# Patient Record
Sex: Female | Born: 1985 | Race: White | Hispanic: No | Marital: Married | Smoking: Never smoker
Health system: Southern US, Community
[De-identification: ages and names within clinical notes are randomized; demographics above are authoritative.]

## PROBLEM LIST (undated history)

## (undated) DIAGNOSIS — R519 Headache, unspecified: Secondary | ICD-10-CM

## (undated) DIAGNOSIS — F32A Depression, unspecified: Secondary | ICD-10-CM

## (undated) DIAGNOSIS — D649 Anemia, unspecified: Secondary | ICD-10-CM

## (undated) DIAGNOSIS — R569 Unspecified convulsions: Secondary | ICD-10-CM

## (undated) DIAGNOSIS — M1990 Unspecified osteoarthritis, unspecified site: Secondary | ICD-10-CM

## (undated) DIAGNOSIS — E7800 Pure hypercholesterolemia, unspecified: Secondary | ICD-10-CM

## (undated) DIAGNOSIS — Z9114 Patient's other noncompliance with medication regimen: Secondary | ICD-10-CM

## (undated) DIAGNOSIS — F419 Anxiety disorder, unspecified: Secondary | ICD-10-CM

## (undated) DIAGNOSIS — Z91148 Patient's other noncompliance with medication regimen for other reason: Secondary | ICD-10-CM

## (undated) HISTORY — PX: NO PAST SURGERIES: SHX2092

## (undated) HISTORY — DX: Headache, unspecified: R51.9

## (undated) HISTORY — DX: Unspecified osteoarthritis, unspecified site: M19.90

## (undated) HISTORY — DX: Depression, unspecified: F32.A

## (undated) HISTORY — DX: Anxiety disorder, unspecified: F41.9

## (undated) HISTORY — DX: Pure hypercholesterolemia, unspecified: E78.00

---
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
## 2004-03-06 ENCOUNTER — Inpatient Hospital Stay (HOSPITAL_COMMUNITY): Admission: AD | Admit: 2004-03-06 | Discharge: 2004-03-06 | Payer: Self-pay | Admitting: Obstetrics

## 2004-06-28 ENCOUNTER — Inpatient Hospital Stay (HOSPITAL_COMMUNITY): Admission: AD | Admit: 2004-06-28 | Discharge: 2004-07-01 | Payer: Self-pay | Admitting: Obstetrics

## 2004-11-10 ENCOUNTER — Emergency Department (HOSPITAL_COMMUNITY): Admission: EM | Admit: 2004-11-10 | Discharge: 2004-11-10 | Payer: Self-pay | Admitting: Emergency Medicine

## 2004-11-18 ENCOUNTER — Emergency Department (HOSPITAL_COMMUNITY): Admission: EM | Admit: 2004-11-18 | Discharge: 2004-11-18 | Payer: Self-pay | Admitting: Emergency Medicine

## 2015-06-13 ENCOUNTER — Other Ambulatory Visit: Payer: Self-pay | Admitting: Adult Health

## 2015-06-13 ENCOUNTER — Encounter: Payer: Self-pay | Admitting: Adult Health

## 2015-06-26 ENCOUNTER — Other Ambulatory Visit: Payer: Self-pay | Admitting: Advanced Practice Midwife

## 2015-07-10 ENCOUNTER — Other Ambulatory Visit: Payer: Self-pay | Admitting: Advanced Practice Midwife

## 2015-07-19 ENCOUNTER — Ambulatory Visit (INDEPENDENT_AMBULATORY_CARE_PROVIDER_SITE_OTHER): Payer: Medicaid Other | Admitting: Advanced Practice Midwife

## 2015-07-19 ENCOUNTER — Other Ambulatory Visit (HOSPITAL_COMMUNITY)
Admission: RE | Admit: 2015-07-19 | Discharge: 2015-07-19 | Disposition: A | Discharge status: Home / Self Care | Payer: Medicaid Other | Source: Ambulatory Visit | Attending: Advanced Practice Midwife | Admitting: Advanced Practice Midwife

## 2015-07-19 ENCOUNTER — Encounter: Payer: Self-pay | Admitting: Advanced Practice Midwife

## 2015-07-19 VITALS — BP 112/80 | HR 76 | Ht 62.25 in | Wt 227.0 lb

## 2015-07-19 DIAGNOSIS — Z1151 Encounter for screening for human papillomavirus (HPV): Secondary | ICD-10-CM | POA: Diagnosis present

## 2015-07-19 DIAGNOSIS — Z1329 Encounter for screening for other suspected endocrine disorder: Secondary | ICD-10-CM

## 2015-07-19 DIAGNOSIS — Z13220 Encounter for screening for lipoid disorders: Secondary | ICD-10-CM

## 2015-07-19 DIAGNOSIS — Z0000 Encounter for general adult medical examination without abnormal findings: Secondary | ICD-10-CM

## 2015-07-19 DIAGNOSIS — Z113 Encounter for screening for infections with a predominantly sexual mode of transmission: Secondary | ICD-10-CM | POA: Diagnosis present

## 2015-07-19 DIAGNOSIS — Z01419 Encounter for gynecological examination (general) (routine) without abnormal findings: Secondary | ICD-10-CM | POA: Diagnosis not present

## 2015-07-19 MED ORDER — NYSTATIN 100000 UNIT/GM EX POWD: Freq: Four times a day (QID) | CUTANEOUS | Status: DC

## 2015-07-19 NOTE — Progress Notes (Signed)
Alexandra GravenMichele E Garrett 30 y.o.  Filed Vitals:   07/19/15 1110  BP: 112/80  Pulse: 76     Filed Weights   07/19/15 1110  Weight: 227 lb (102.967 kg)    Past Medical History: History reviewed. No pertinent past medical history.  Past Surgical History: History reviewed. No pertinent past surgical history.  Family History: Family History  Problem Relation Age of Onset  . Arthritis Mother   . Cancer Mother     lymph nodes  . Depression Mother   . Hypertension Mother   . Heart disease Father   . Cancer Paternal Aunt     breast  . Arthritis Maternal Grandmother   . Cancer Maternal Grandmother     breast  . Diabetes Maternal Grandmother   . Cancer Maternal Grandfather     lung  . Stroke Paternal Grandmother   . Heart disease Paternal Grandfather     Social History: Social History  Substance Use Topics  . Smoking status: Never Smoker   . Smokeless tobacco: Never Used  . Alcohol Use: No    Allergies: No Known Allergies    Current outpatient prescriptions:  .  levonorgestrel (MIRENA) 20 MCG/24HR IUD, 1 each by Intrauterine route once., Disp: , Rfl:   History of Present Illness: Here for pap and physical . [place]  Has never had an abnormal pap. Last pap 2-3 years ago.  Not sexually active--has committed not to have sex again. until married.  Has one son, 31 years old.  Has Mirena, due to come out December 2017 Gets yeast in groin folds. Requests refill of Nystatin powder \.  Has stress incontinence, getting worse. No nocturnal voiding, no urge.  Doesn't want surgery if possible Review of Systems   Patient denies any headaches, blurred vision, shortness of breath, chest pain, abdominal pain, problems with bowel movements, urination, or intercourse.   Physical Exam: General:  Well developed, well nourished, no acute distress Skin:  Warm and dry Neck:  Midline trachea, normal thyroid Lungs; Clear to auscultation bilaterally Breast:  No dominant palpable  mass, retraction, or nipple discharge Cardiovascular: Regular rate and rhythm Abdomen:  Soft, non tender, no hepatosplenomegaly Pelvic:  External genitalia is normal in appearance.  The vagina is normal in appearance.  The cervix is bulbous, strings visible. Uterus is felt to be normal size, shape, and contour.  No adnexal masses or tenderness noted. Exam limited by habitus Extremities:  No swelling or varicosities noted Psych:  No mood changes.     Impression: normal gyn exam     Plan: remove/replace IUD 12/17  Orders Placed This Encounter  Procedures  . TSH  . Lipid panel    Order Specific Question:  Has the patient fasted?    Answer:  Yes  . CBC  . Comprehensive metabolic panel    Order Specific Question:  Has the patient fasted?    Answer:  Yes

## 2015-07-23 LAB — CYTOLOGY - PAP

## 2015-08-02 ENCOUNTER — Telehealth: Payer: Self-pay | Admitting: Advanced Practice Midwife

## 2015-08-02 NOTE — Telephone Encounter (Signed)
Pt aware of pap results.

## 2015-08-09 ENCOUNTER — Encounter: Payer: Self-pay | Admitting: Obstetrics & Gynecology

## 2015-08-09 ENCOUNTER — Ambulatory Visit (INDEPENDENT_AMBULATORY_CARE_PROVIDER_SITE_OTHER): Payer: Medicaid Other | Admitting: Obstetrics & Gynecology

## 2015-08-09 VITALS — BP 100/80 | HR 72 | Ht 62.0 in | Wt 231.0 lb

## 2015-08-09 DIAGNOSIS — N3946 Mixed incontinence: Secondary | ICD-10-CM

## 2015-08-09 MED ORDER — SOLIFENACIN SUCCINATE 10 MG PO TABS: 10.0000 mg | ORAL_TABLET | Freq: Every day | ORAL | Status: DC

## 2015-08-09 NOTE — Progress Notes (Signed)
Patient ID: Alexandra Garrett, female   DOB: [birth year], 30 y.o.   MRN: [id number]   Chief Complaint  Patient presents with  . gyn visit    unable hold urine     HPI:    30 y.o. No obstetric history on file. No LMP recorded. Patient is not currently having periods (Reason: IUD).   Location:  bladder. Quality:  variable. Severity:  variable. Timing:  episodic. Duration:  . Context:  Coughing sneezing 10% of loss No reason 80-90%. Modifying factors:  Drinks 4 sodas per day Signs/Symptoms:      Current outpatient prescriptions:  .  levonorgestrel (MIRENA) 20 MCG/24HR IUD, 1 each by Intrauterine route once., Disp: , Rfl:  .  nystatin (NYSTATIN) powder, Apply topically 4 (four) times daily., Disp: 15 g, Rfl: 3 .  solifenacin (VESICARE) 10 MG tablet, Take 1 tablet (10 mg total) by mouth daily., Disp: 30 tablet, Rfl: 11  Problem Pertinent ROS:       No burning with urination, frequency or urgency No nausea, vomiting or diarrhea Nor fever chills or other constitutional symptoms   Extended ROS:        PMFSH:             History reviewed. No pertinent past medical history.  History reviewed. No pertinent past surgical history.  OB History    No data available      No Known Allergies  Social History   Social History  . Marital Status: Single    Spouse Name: N/A  . Number of Children: N/A  . Years of Education: N/A   Social History Main Topics  . Smoking status: Never Smoker   . Smokeless tobacco: Never Used  . Alcohol Use: No  . Drug Use: No  . Sexual Activity: Not Currently    Birth Control/ Protection: IUD   Other Topics Concern  . None   Social History Narrative    Family History  Problem Relation Age of Onset  . Arthritis Mother   . Cancer Mother     lymph nodes  . Depression Mother   . Hypertension Mother   . Heart disease Father   . Cancer Paternal Aunt     breast  . Arthritis Maternal Grandmother   . Cancer Maternal Grandmother    breast  . Diabetes Maternal Grandmother   . Cancer Maternal Grandfather     lung  . Stroke Paternal Grandmother   . Heart disease Paternal Grandfather      Examination:  Vitals:  Blood pressure 100/80, pulse 72, height  (1.575 m), weight 231 lb (104.781 kg).    Physical Examination:     General Appearance:  awake, alert, oriented, in no acute distress  Vulva:  normal appearing vulva with no masses, tenderness or lesions Vagina:  normal mucosa, no discharge Grade I cystocoele Cervix:  no cervical motion tenderness and no lesions Uterus:  normal size, contour, position, consistency, mobility, non-tender Adnexa: ovaries:present,  normal adnexa in size, nontender and no masses  No results found for this or any previous visit (from the past 72 hour(s)).   DATA orders and reviews: Labs were not ordered today:   Imaging studies were not ordered today:    Lab tests were reviewed today:   Pap Imaging studies were not reviewed today:    I did not independently review/view images, tracing or specimen(not simply the report) myself.    Prescription Drug Management:   Prescriptions prescribed this encounter:    Meds  ordered this encounter  Medications  . solifenacin (VESICARE) 10 MG tablet    Sig: Take 1 tablet (10 mg total) by mouth daily.    Dispense:  30 tablet    Refill:  11    Renewed Prescriptions:    Current prescription changes:     Impression/Plan(Problem Based):  1. Mixed incontinence:  New problem      Follow Up:   Return in about 6 weeks (around 09/20/2015) for Follow up, with Dr Despina HiddenEure.      Face to face time:  15 minutes  Greater than 50% of the visit time was spent in counseling and coordination of care with the patient.  The summary and outline of the counseling and care coordination is summarized in the note above.   All questions were answered.

## 2015-08-11 ENCOUNTER — Encounter (HOSPITAL_COMMUNITY): Payer: Self-pay | Admitting: *Deleted

## 2015-08-11 ENCOUNTER — Emergency Department (HOSPITAL_COMMUNITY)
Admission: EM | Admit: 2015-08-11 | Discharge: 2015-08-11 | Disposition: A | Discharge status: Home / Self Care | Payer: Medicaid Other | Attending: Emergency Medicine | Admitting: Emergency Medicine

## 2015-08-11 ENCOUNTER — Emergency Department (HOSPITAL_COMMUNITY): Payer: Medicaid Other

## 2015-08-11 DIAGNOSIS — G40909 Epilepsy, unspecified, not intractable, without status epilepticus: Secondary | ICD-10-CM | POA: Insufficient documentation

## 2015-08-11 DIAGNOSIS — R569 Unspecified convulsions: Secondary | ICD-10-CM

## 2015-08-11 HISTORY — DX: Anemia, unspecified: D64.9

## 2015-08-11 HISTORY — DX: Unspecified convulsions: R56.9

## 2015-08-11 LAB — COMPREHENSIVE METABOLIC PANEL WITH GFR
ALT: 17 U/L (ref 14–54)
AST: 19 U/L (ref 15–41)
Albumin: 3.8 g/dL (ref 3.5–5.0)
Alkaline Phosphatase: 79 U/L (ref 38–126)
Anion gap: 5 (ref 5–15)
BUN: 12 mg/dL (ref 6–20)
CO2: 26 mmol/L (ref 22–32)
Calcium: 8.5 mg/dL — ABNORMAL LOW (ref 8.9–10.3)
Chloride: 103 mmol/L (ref 101–111)
Creatinine, Ser: 0.83 mg/dL (ref 0.44–1.00)
GFR calc Af Amer: >60 mL/min
GFR calc non Af Amer: >60 mL/min
Glucose, Bld: 93 mg/dL (ref 65–99)
Potassium: 4.2 mmol/L (ref 3.5–5.1)
Sodium: 134 mmol/L — ABNORMAL LOW (ref 135–145)
Total Bilirubin: 0.9 mg/dL (ref 0.3–1.2)
Total Protein: 7.4 g/dL (ref 6.5–8.1)

## 2015-08-11 LAB — CBC
HCT: 40.3 % (ref 36.0–46.0)
Hemoglobin: 13 g/dL (ref 12.0–15.0)
MCH: 27.7 pg (ref 26.0–34.0)
MCHC: 32.3 g/dL (ref 30.0–36.0)
MCV: 85.7 fL (ref 78.0–100.0)
Platelets: 328 K/uL (ref 150–400)
RBC: 4.7 MIL/uL (ref 3.87–5.11)
RDW: 13.8 % (ref 11.5–15.5)
WBC: 8.4 K/uL (ref 4.0–10.5)

## 2015-08-11 LAB — ETHANOL: Alcohol, Ethyl (B): <5 mg/dL

## 2015-08-11 MED ORDER — SODIUM CHLORIDE 0.9 % IV SOLN
500.0000 mg | Freq: Once | INTRAVENOUS | Status: AC
  Administered 2015-08-11: 500 mg via INTRAVENOUS
  Filled 2015-08-11: qty 5

## 2015-08-11 MED ORDER — LEVETIRACETAM IN NACL 500 MG/100ML IV SOLN
INTRAVENOUS | Status: AC
  Filled 2015-08-11: qty 100

## 2015-08-11 MED ORDER — LEVETIRACETAM 500 MG PO TABS: 500.0000 mg | ORAL_TABLET | Freq: Two times a day (BID) | ORAL | Status: DC

## 2015-08-11 NOTE — ED Notes (Signed)
Pt brought in by [facility] for c/o seizure; pt's son called ems when he saw his mom's body shaking; when ems arrived on scene, pt was postictal and only knew her name and dob; pt is alert at this time

## 2015-08-11 NOTE — ED Notes (Signed)
Seizure pads to stretcher

## 2015-08-11 NOTE — ED Notes (Signed)
Family at bedside. 

## 2015-08-11 NOTE — Discharge Instructions (Signed)
You are NOT to drive until cleared by a neurologist. You MUST call the Neurologist to arrange follow up this week Let your family doctor know about your results today.  I have included a copy with this paperwork  Please obtain all of your results from medical records or have your doctors office obtain the results - share them with your doctor - you should be seen at your doctors office in the next 2 days. Call today to arrange your follow up. Take the medications as prescribed. Please review all of the medicines and only take them if you do not have an allergy to them. Please be aware that if you are taking birth control pills, taking other prescriptions, ESPECIALLY ANTIBIOTICS may make the birth control ineffective - if this is the case, either do not engage in sexual activity or use alternative methods of birth control such as condoms until you have finished the medicine and your family doctor says it is OK to restart them. If you are on a blood thinner such as COUMADIN, be aware that any other medicine that you take may cause the coumadin to either work too much, or not enough - you should have your coumadin level rechecked in next 7 days if this is the case.  ?  It is also a possibility that you have an allergic reaction to any of the medicines that you have been prescribed - Everybody reacts differently to medications and while MOST people have no trouble with most medicines, you may have a reaction such as nausea, vomiting, rash, swelling, shortness of breath. If this is the case, please stop taking the medicine immediately and contact your physician.  ?  You should return to the ER if you develop severe or worsening symptoms.

## 2015-08-11 NOTE — ED Notes (Signed)
Pt calling for ride. Pt very sleepy at present

## 2015-08-11 NOTE — ED Notes (Signed)
Pt friend, Alexandra Garrett (Steven) in to bedside. Pt is conversant in complete sentences

## 2015-08-11 NOTE — ED Provider Notes (Signed)
CSN: [id number]     Arrival date & time 08/11/15  4098 History   First MD Initiated Contact with Patient 08/11/15 [phone]     Chief Complaint  Patient presents with  . Seizures     (Consider location/radiation/quality/duration/timing/severity/associated sxs/prior Treatment) HPI Comments: 30 years old, history of seizures as a child, has not been on Tegretol in many years. She reports that over the last couple of years she has had occasional seizures, she states that she thinks they happen at nighttime, she will wake up feeling confused with urinary incontinence, she has told her children that if she has a seizure patient call 911. Apparently the children witnessed some seizure like activity, the patient was unresponsive, 911 was called and the patient was found to be postictal. No further seizure activity was witnessed. The patient denies taking any medications in years. Her last seizure was approximately 3 months ago during which time she did not seek medical evaluation. She denies any prodromal symptoms and states that yesterday and last night were totally normal. At this time she states that she feels back to normal though after the seizure occurred this morning she states that she felt a small amount of headache and dizziness. She denies numbness weakness blurred vision chest pain palpitation shortness of breath or any other complaints. 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  Patient is a 30 y.o. female presenting with seizures. The history is provided by the patient.  Seizures   Past Medical History  Diagnosis Date  . Seizures (HCC)   . Anemia    History reviewed. No pertinent past surgical history. Family History  Problem Relation Age of Onset  . Arthritis Mother   . Cancer Mother     lymph nodes  . Depression Mother   . Hypertension Mother   . Heart disease Father   . Cancer Paternal Aunt     breast  . Arthritis Maternal  Grandmother   . Cancer Maternal Grandmother     breast  . Diabetes Maternal Grandmother   . Cancer Maternal Grandfather     lung  . Stroke Paternal Grandmother   . Heart disease Paternal Grandfather    Social History  Substance Use Topics  . Smoking status: Never Smoker   . Smokeless tobacco: Never Used  . Alcohol Use: No   OB History    No data available     Review of Systems  Neurological: Positive for seizures.  All other systems reviewed and are negative.     Allergies  Review of patient's allergies indicates no known allergies.  Home Medications   Prior to Admission medications   Medication Sig Start Date End Date Taking? Authorizing Provider  levonorgestrel (MIRENA) 20 MCG/24HR IUD 1 each by Intrauterine route once.    Historical Provider, MD  nystatin (NYSTATIN) powder Apply topically 4 (four) times daily. 07/19/15   Jacklyn Shell, CNM  solifenacin (VESICARE) 10 MG tablet Take 1 tablet (10 mg total) by mouth daily. 08/09/15   Lazaro Arms, MD   BP 110/76 mmHg  Pulse 66  Temp(Src) 98.2 F (36.8 C) (Oral)  Resp 15  Ht  (1.575 m)  Wt 230 lb (104.327 kg)  BMI 42.06 kg/m2  SpO2 100% Physical Exam  Constitutional: She appears well-developed and well-nourished. No distress.  HENT:  Head: Normocephalic and atraumatic.  Mouth/Throat: Oropharynx is clear and moist. No oropharyngeal exudate.  No tongue biting  Eyes: Conjunctivae and EOM are normal. Pupils are equal, round, and reactive to light. Right eye exhibits no discharge. Left eye exhibits no discharge. No scleral icterus.  Neck: Normal range of motion. Neck supple. No JVD present. No thyromegaly present.  Cardiovascular: Normal rate, regular rhythm, normal heart sounds and intact distal pulses.  Exam reveals no gallop and no friction rub.   No murmur heard. Pulmonary/Chest: Effort normal and breath sounds normal. No respiratory distress. She has no wheezes. She has no rales.  Abdominal: Soft.  Bowel sounds are normal. She exhibits no distension and no mass. There is no tenderness.  Musculoskeletal: Normal range of motion. She exhibits no edema or tenderness.  Lymphadenopathy:    She has no cervical adenopathy.  Neurological: She is alert. Coordination normal.  Speech is clear, cranial nerves III through XII are intact, memory is intact, strength is normal in all 4 extremities including grips, sensation is intact to light touch and pinprick in all 4 extremities. Coordination as tested by finger-nose-finger is normal, no limb ataxia. Normal gait, normal reflexes at the patellar tendons bilaterally  Skin: Skin is warm and dry. No rash noted. No erythema.  Psychiatric: She has a normal mood and affect. Her behavior is normal.  Nursing note and vitals reviewed.   ED Course  Procedures (including critical care time) Labs Review Labs Reviewed  CBC  COMPREHENSIVE METABOLIC PANEL  ETHANOL  URINE RAPID DRUG SCREEN, HOSP PERFORMED    Imaging Review No results found. I have personally reviewed and evaluated these images and lab results as part of my medical decision-making.    MDM   Final diagnoses:  Seizure (HCC)    At this time the patient's vital signs are totally normal, there is no seizure activity, she has a clear sensorium and is following commands without difficulty. She has not had evaluation for seizures many many years, CT of the head would be prudent for baseline evaluation prior to restarting antiseizure medications. Neurology follow-up anticipated, patient will be given phone number, states that she is willing to follow-up in the outpatient setting. Restrictions regarding driving given.  CT neg Labs neg VS normal No further seizures Pt given instruction on f/u and not ddriving extpresses understanding.  Meds given in ED:  Medications  levETIRAcetam (KEPPRA) 500 mg in sodium chloride 0.9 % 100 mL IVPB (500 mg Intravenous New Bag/Given 08/11/15 0825)    New  Prescriptions   LEVETIRACETAM (KEPPRA) 500 MG TABLET    Take 1 tablet (500 mg total) by mouth 2 (two) times daily.      Eber HongBrian Mikahla Wisor, MD 08/11/15 1002

## 2015-08-11 NOTE — ED Notes (Signed)
Patient's children going with stephen down hall. Mother is aware and states she called for him to come and collect

## 2015-08-11 NOTE — ED Notes (Addendum)
Pt reports history of seizures (only at night per her report). EMS reports that children called 911 when they found her seizing at home. She was post ictal upon arrival by EMS. Pt reports she was on tegretol, but it has been a long time since she had a seizure. She states that she had seizures as a child for unknown reasons. Pt has not been incontinent, appears to have no  no bite to tongue. Denies complains save reports tired.

## 2015-09-01 ENCOUNTER — Encounter (HOSPITAL_COMMUNITY): Payer: Self-pay

## 2015-09-01 ENCOUNTER — Emergency Department (HOSPITAL_COMMUNITY)
Admission: EM | Admit: 2015-09-01 | Discharge: 2015-09-02 | Disposition: A | Discharge status: Home / Self Care | Payer: Medicaid Other | Attending: Emergency Medicine | Admitting: Emergency Medicine

## 2015-09-01 DIAGNOSIS — G40909 Epilepsy, unspecified, not intractable, without status epilepticus: Secondary | ICD-10-CM | POA: Diagnosis not present

## 2015-09-01 DIAGNOSIS — R110 Nausea: Secondary | ICD-10-CM | POA: Diagnosis not present

## 2015-09-01 DIAGNOSIS — R51 Headache: Secondary | ICD-10-CM | POA: Diagnosis not present

## 2015-09-01 NOTE — ED Provider Notes (Signed)
CSN: [id number]     Arrival date & time 09/01/15  2330 History  By signing my name below, I, Bethel BornBritney McCollum, attest that this documentation has been prepared under the direction and in the presence of Devoria AlbeIva Emmajo Bennette, MD at 00:16 AM. Electronically Signed: Bethel BornBritney McCollum, ED Scribe. 09/02/2015. 12:26 AM    Chief Complaint  Patient presents with  . Seizures   The history is provided by the patient. No language interpreter was used.   Brought in by EMS from home, Alexandra Garrett is a 30 y.o. female with PMHx of seizures ho presents to the Emergency Department complaining of a seizure tonight witnessed by her children. Her children, age 459 and 4511, report that she was sleeping on the couch before her arms stated "moving" and "the she started shaking". She did not fall off of the couch. The episode lasted for 1-2 minutes. After waking she was able to talk to them and she did not seem confused. Her children did not note any color change. The pt does not remember the incident. She recalls watching a movie with her children and then being put on a stretcher by EMS.  Associated symptoms include headache, nausea, and fatigue. Pt denies urinary incontinence. Pt states that she had seizures as a child but they returned a few years ago and seemed to be associated with headaches and stress. She does not note any aura prior to the episodes. She was seen in the ED on June 3 for a seizure and at that time was started on Keppra and she was referred to a neurologist. She reports missing 2-3 doses of her Keppra, and thinks she took it last yesterday morning. She lost the information concerning her neurology follow up. Pt states that she was not aware that she should not be driving because she only has seizures at night.    She is in the process of establishing primary care in [place].  Past Medical History  Diagnosis Date  . Seizures (HCC)   . Anemia    History reviewed. No pertinent past surgical history. Family History   Problem Relation Age of Onset  . Arthritis Mother   . Cancer Mother     lymph nodes  . Depression Mother   . Hypertension Mother   . Heart disease Father   . Cancer Paternal Aunt     breast  . Arthritis Maternal Grandmother   . Cancer Maternal Grandmother     breast  . Diabetes Maternal Grandmother   . Cancer Maternal Grandfather     lung  . Stroke Paternal Grandmother   . Heart disease Paternal Grandfather    Social History  Substance Use Topics  . Smoking status: Never Smoker   . Smokeless tobacco: Never Used  . Alcohol Use: No   Employed as a CNA at [employer]  OB History    No data available     Review of Systems  Constitutional: Positive for fatigue.  Gastrointestinal: Positive for nausea.  Skin: Negative for color change.  Neurological: Positive for seizures and headaches.  All other systems reviewed and are negative.  Allergies  Review of patient's allergies indicates no known allergies.  Home Medications   Prior to Admission medications   Medication Sig Start Date End Date Taking? Authorizing Provider  levETIRAcetam (KEPPRA) 500 MG tablet Take 1 tablet (500 mg total) by mouth 2 (two) times daily. 08/11/15   Eber HongBrian Miller, MD  levonorgestrel (MIRENA) 20 MCG/24HR IUD 1 each by Intrauterine route once.  Historical Provider, MD  nystatin (NYSTATIN) powder Apply topically 4 (four) times daily. 07/19/15   Jacklyn ShellFrances Cresenzo-Dishmon, CNM  solifenacin (VESICARE) 10 MG tablet Take 1 tablet (10 mg total) by mouth daily. 08/09/15   Lazaro ArmsLuther H Eure, MD   BP 113/76 mmHg  Pulse 78  Temp(Src) 98.1 F (36.7 C) (Oral)  Resp 16  Ht 5\' 2"  (1.575 m)  Wt 230 lb (104.327 kg)  BMI 42.06 kg/m2  SpO2 94%  Vital signs normal   Physical Exam  Constitutional: She is oriented to person, place, and time. She appears well-developed and well-nourished.  Non-toxic appearance. She does not appear ill. No distress.  HENT:  Head: Normocephalic and atraumatic.  Right Ear: External ear  normal.  Left Ear: External ear normal.  Nose: Nose normal. No mucosal edema or rhinorrhea.  Mouth/Throat: Oropharynx is clear and moist and mucous membranes are normal. No dental abscesses or uvula swelling.  No trauma to tongue  Eyes: Conjunctivae and EOM are normal. Pupils are equal, round, and reactive to light.  Neck: Normal range of motion and full passive range of motion without pain. Neck supple.  Cardiovascular: Normal rate, regular rhythm and normal heart sounds.  Exam reveals no gallop and no friction rub.   No murmur heard. Pulmonary/Chest: Effort normal and breath sounds normal. No respiratory distress. She has no wheezes. She has no rhonchi. She has no rales. She exhibits no tenderness and no crepitus.  Abdominal: Soft. Normal appearance and bowel sounds are normal. She exhibits no distension. There is no tenderness. There is no rebound and no guarding.  Musculoskeletal: Normal range of motion. She exhibits no edema or tenderness.  Moves all extremities well.   Neurological: She is alert and oriented to person, place, and time. She has normal strength. No cranial nerve deficit.  Skin: Skin is warm, dry and intact. No rash noted. No erythema. No pallor.  Psychiatric: She has a normal mood and affect. Her speech is normal and behavior is normal. Her mood appears not anxious.  Nursing note and vitals reviewed.   ED Course  Procedures (including critical care time)  Medications  acetaminophen (TYLENOL) tablet 1,000 mg (1,000 mg Oral Given 09/02/15 0101)  levETIRAcetam (KEPPRA) 500 mg in sodium chloride 0.9 % 100 mL IVPB (500 mg Intravenous New Bag/Given 09/02/15 0114)    DIAGNOSTIC STUDIES: Oxygen Saturation is 94% on RA,  normal by my interpretation.    COORDINATION OF CARE: 12:24 AM Discussed treatment plan which includes Tylenol and Keppra with pt at bedside and pt agreed to plan. Patient was given Tylenol orally. She was given a dose of Keppra IV.  I again emphasized to  the patient she should not drive until she is cleared to drive by the neurologist. We discussed strategies for making sure she does not miss her medication anymore. She states she thinks she is going to put an alarm on her cell phone to help remind her to take her medicine.  1:30 AM patient's Keppra has infused. She states her headache is improved although still present. She feels ready to be discharged. She was given Dr. Ronal Fearoonquah's number again to she can call and figure out what her appointment time was.    MDM   Final diagnoses:  Seizure disorder [facility](HCC)    Plan discharge  Devoria AlbeIva Dixie Coppa, MD, FACEP   I personally performed the services described in this documentation, which was scribed in my presence. The recorded information has been reviewed and considered.  Devoria AlbeIva Gladstone Rosas, MD,  Concha Pyo, MD 09/02/15 [phone]

## 2015-09-01 NOTE — ED Notes (Signed)
Pt arrives via EMS, per patients son the patient began twitching for a couple of minutes at home. Per EMS CBG 108 abd VSS stable upon their arrival. Pt awake, oriented times 4. When asked what happened at home patient states "i had a seizure".

## 2015-09-02 MED ORDER — LEVETIRACETAM IN NACL 500 MG/100ML IV SOLN
INTRAVENOUS | Status: AC
  Filled 2015-09-02: qty 100

## 2015-09-02 MED ORDER — ACETAMINOPHEN 500 MG PO TABS
1000.0000 mg | ORAL_TABLET | Freq: Once | ORAL | Status: AC
  Administered 2015-09-02: 1000 mg via ORAL
  Filled 2015-09-02: qty 2

## 2015-09-02 MED ORDER — SODIUM CHLORIDE 0.9 % IV SOLN
500.0000 mg | Freq: Once | INTRAVENOUS | Status: AC
  Administered 2015-09-02: 500 mg via INTRAVENOUS
  Filled 2015-09-02: qty 5

## 2015-09-02 NOTE — Discharge Instructions (Signed)
DO NOT DRIVE UNTIL YOU ARE CLEARED TO DRIVE AGAIN BY THE NEUROLOGIST!!! Try to develop a system so you don't forget to take your medication every day.  Epilepsy Epilepsy is a disorder in which a person has repeated seizures over time. A seizure is a release of abnormal electrical activity in the brain. Seizures can cause a change in attention, behavior, or the ability to remain awake and alert (altered mental status). Seizures often involve uncontrollable shaking (convulsions).  Most people with epilepsy lead normal lives. However, people with epilepsy are at an increased risk of falls, accidents, and injuries. Therefore, it is important to begin treatment right away. CAUSES  Epilepsy has many possible causes. Anything that disturbs the normal pattern of brain cell activity can lead to seizures. This may include:   Head injury.  Birth trauma.  High fever as a child.  Stroke.  Bleeding into or around the brain.  Certain drugs.  Prolonged low oxygen, such as what occurs after CPR efforts.  Abnormal brain development.  Certain illnesses, such as meningitis, encephalitis (brain infection), malaria, and other infections.  An imbalance of nerve signaling chemicals (neurotransmitters).  SIGNS AND SYMPTOMS  The symptoms of a seizure can vary greatly from one person to another. Right before a seizure, you may have a warning (aura) that a seizure is about to occur. An aura may include the following symptoms:  Fear or anxiety.  Nausea.  Feeling like the room is spinning (vertigo).  Vision changes, such as seeing flashing lights or spots. Common symptoms during a seizure include:  Abnormal sensations, such as an abnormal smell or a bitter taste in the mouth.   Sudden, general body stiffness.   Convulsions that involve rhythmic jerking of the face, arm, or leg on one or both sides.   Sudden change in consciousness.   Appearing to be awake but not responding.   Appearing to be  asleep but cannot be awakened.   Grimacing, chewing, lip smacking, drooling, tongue biting, or loss of bowel or bladder control. After a seizure, you may feel sleepy for a while. DIAGNOSIS  Your health care provider will ask about your symptoms and take a medical history. Descriptions from any witnesses to your seizures will be very helpful in the diagnosis. A physical exam, including a detailed neurological exam, is necessary. Various tests may be done, such as:   An electroencephalogram (EEG). This is a painless test of your brain waves. In this test, a diagram is created of your brain waves. These diagrams can be interpreted by a specialist.  An MRI of the brain.   A CT scan of the brain.   A spinal tap (lumbar puncture, LP).  Blood tests to check for signs of infection or abnormal blood chemistry. TREATMENT  There is no cure for epilepsy, but it is generally treatable. Once epilepsy is diagnosed, it is important to begin treatment as soon as possible. For most people with epilepsy, seizures can be controlled with medicines. The following may also be used:  A pacemaker for the brain (vagus nerve stimulator) can be used for people with seizures that are not well controlled by medicine.  Surgery on the brain. For some people, epilepsy eventually goes away. HOME CARE INSTRUCTIONS   Follow your health care provider's recommendations on driving and safety in normal activities.  Get enough rest. Lack of sleep can cause seizures.  Only take over-the-counter or prescription medicines as directed by your health care provider. Take any prescribed medicine exactly  as directed.  Avoid any known triggers of your seizures.  Keep a seizure diary. Record what you recall about any seizure, especially any possible trigger.   Make sure the people you live and work with know that you are prone to seizures. They should receive instructions on how to help you. In general, a witness to a seizure  should:   Cushion your head and body.   Turn you on your side.   Avoid unnecessarily restraining you.   Not place anything inside your mouth.   Call for emergency medical help if there is any question about what has occurred.   Follow up with your health care provider as directed. You may need regular blood tests to monitor the levels of your medicine.  SEEK MEDICAL CARE IF:   You develop signs of infection or other illness. This might increase the risk of a seizure.   You seem to be having more frequent seizures.   Your seizure pattern is changing.  SEEK IMMEDIATE MEDICAL CARE IF:   You have a seizure that does not stop after a few moments.   You have a seizure that causes any difficulty in breathing.   You have a seizure that results in a very severe headache.   You have a seizure that leaves you with the inability to speak or use a part of your body.    This information is not intended to replace advice given to you by your health care provider. Make sure you discuss any questions you have with your health care provider.   Document Released: 02/24/2005 Document Revised: 12/15/2012 Document Reviewed: 10/06/2012 Elsevier Interactive Patient Education [employer].

## 2015-09-02 NOTE — ED Notes (Signed)
Pt c/o that the IV inserted by EMS "hurting", IV removed

## 2015-09-20 ENCOUNTER — Encounter: Payer: Self-pay | Admitting: Obstetrics & Gynecology

## 2015-09-20 ENCOUNTER — Ambulatory Visit (INDEPENDENT_AMBULATORY_CARE_PROVIDER_SITE_OTHER): Payer: Medicaid Other | Admitting: Obstetrics & Gynecology

## 2015-09-20 VITALS — BP 92/62 | HR 56 | Ht 62.0 in | Wt 231.0 lb

## 2015-09-20 DIAGNOSIS — N3946 Mixed incontinence: Secondary | ICD-10-CM | POA: Diagnosis not present

## 2015-09-20 NOTE — Progress Notes (Signed)
Patient ID: Alexandra Garrett, female   DOB: [birth year], 30 y.o.   MRN: [id number]      Chief Complaint  Patient presents with  . Follow-up    Vesicare is helping    Blood pressure 92/62, pulse (!) 56, height 5\' 2"  (1.575 m), weight 231 lb (104.8 kg).  30 y.o. No obstetric history on file. No LMP recorded. Patient is not currently having periods (Reason: IUD). The current method of family planning is none. History reviewed. No pertinent surgical history.  Subjective 29 y.o. No obstetric history on file. No LMP recorded. Patient is not currently having periods (Reason: IUD).   Location:  bladder. Quality:  variable. Severity:  variable. Timing:  episodic. Duration:  . Context:  Coughing sneezing 10% of loss No reason 80-90%. Modifying factors:  Drinks 4 sodas per day Signs/Symptoms:     Pt reports "significant" improvement on the vesicare Objective   Pertinent ROS   Labs or studies     Impression Diagnoses this Encounter::   ICD-9-CM ICD-10-CM   1. Mixed incontinence 788.33 N39.46     Established relevant diagnosis(es):   Plan/Recommendations: No orders of the defined types were placed in this encounter.   Labs or Scans Ordered: No orders of the defined types were placed in this encounter.   Management:: Continue vesicare  Follow up Return in about 6 months (around 03/22/2016) for Follow up, with Dr Despina HiddenEure.        Face to face time:  10 minutes  Greater than 50% of the visit time was spent in counseling and coordination of care with the patient.  The summary and outline of the counseling and care coordination is summarized in the note above.   All questions were answered.

## 2015-09-25 ENCOUNTER — Emergency Department (HOSPITAL_COMMUNITY)
Admission: EM | Admit: 2015-09-25 | Discharge: 2015-09-25 | Disposition: A | Discharge status: Home / Self Care | Payer: Medicaid Other | Attending: Emergency Medicine | Admitting: Emergency Medicine

## 2015-09-25 ENCOUNTER — Encounter (HOSPITAL_COMMUNITY): Payer: Self-pay | Admitting: Emergency Medicine

## 2015-09-25 DIAGNOSIS — Z91199 Patient's noncompliance with other medical treatment and regimen due to unspecified reason: Secondary | ICD-10-CM

## 2015-09-25 DIAGNOSIS — R51 Headache: Secondary | ICD-10-CM | POA: Diagnosis not present

## 2015-09-25 DIAGNOSIS — G40909 Epilepsy, unspecified, not intractable, without status epilepticus: Secondary | ICD-10-CM | POA: Diagnosis present

## 2015-09-25 DIAGNOSIS — Z79899 Other long term (current) drug therapy: Secondary | ICD-10-CM | POA: Insufficient documentation

## 2015-09-25 DIAGNOSIS — Z9119 Patient's noncompliance with other medical treatment and regimen: Secondary | ICD-10-CM | POA: Insufficient documentation

## 2015-09-25 DIAGNOSIS — R569 Unspecified convulsions: Secondary | ICD-10-CM

## 2015-09-25 LAB — POC URINE PREG, ED: Preg Test, Ur: NEGATIVE

## 2015-09-25 MED ORDER — LEVETIRACETAM IN NACL 1000 MG/100ML IV SOLN
1000.0000 mg | Freq: Once | INTRAVENOUS | Status: AC
  Administered 2015-09-25: 1000 mg via INTRAVENOUS

## 2015-09-25 MED ORDER — SODIUM CHLORIDE 0.9 % IV BOLUS (SEPSIS)
1000.0000 mL | Freq: Once | INTRAVENOUS | Status: AC
  Administered 2015-09-25: 1000 mL via INTRAVENOUS

## 2015-09-25 MED ORDER — ACETAMINOPHEN 500 MG PO TABS
ORAL_TABLET | ORAL | Status: AC
  Filled 2015-09-25: qty 2

## 2015-09-25 MED ORDER — LEVETIRACETAM IN NACL 1000 MG/100ML IV SOLN
INTRAVENOUS | Status: AC
  Filled 2015-09-25: qty 100

## 2015-09-25 MED ORDER — ACETAMINOPHEN 500 MG PO TABS
1000.0000 mg | ORAL_TABLET | Freq: Once | ORAL | Status: AC
  Administered 2015-09-25: 1000 mg via ORAL

## 2015-09-25 NOTE — ED Notes (Signed)
Pt requesting food/drink. EDP aware. Pt given peanut butter crackers and ginger ale. nad noted.

## 2015-09-25 NOTE — ED Provider Notes (Signed)
CSN: [id number]     Arrival date & time 09/25/15  1308 History   First MD Initiated Contact with Patient 09/25/15 0715     Chief Complaint  Patient presents with  . Seizures     (Consider location/radiation/quality/duration/timing/severity/associated sxs/prior Treatment) Patient is a 30 y.o. female presenting with seizures.  Seizures Seizure activity on arrival: no   Seizure type:  Grand mal Preceding symptoms: no sensation of an aura present, no dizziness and no headache   Initial focality:  Diffuse Episode characteristics: confusion, disorientation, generalized shaking and unresponsiveness   Postictal symptoms: somnolence   Return to baseline: yes   Severity:  Mild Duration:  1 minute Timing:  Once Number of seizures this episode:  1 Progression:  Resolved Context: medical non-compliance   Recent head injury:  No recent head injuries PTA treatment:  None History of seizures: yes   Similar to previous episodes: yes   Severity:  Mild Seizure control level:  Poorly controlled Current therapy:  Levetiracetam Compliance with current therapy:  Poor   Past Medical History  Diagnosis Date  . Seizures (HCC)   . Anemia    History reviewed. No pertinent past surgical history. Family History  Problem Relation Age of Onset  . Arthritis Mother   . Cancer Mother     lymph nodes  . Depression Mother   . Hypertension Mother   . Heart disease Father   . Cancer Paternal Aunt     breast  . Arthritis Maternal Grandmother   . Cancer Maternal Grandmother     breast  . Diabetes Maternal Grandmother   . Cancer Maternal Grandfather     lung  . Stroke Paternal Grandmother   . Heart disease Paternal Grandfather    Social History  Substance Use Topics  . Smoking status: Never Smoker   . Smokeless tobacco: Never Used  . Alcohol Use: No   OB History    No data available     Review of Systems  Constitutional: Positive for fatigue. Negative for fever and chills.  HENT:  Negative for congestion and rhinorrhea.   Eyes: Negative for visual disturbance.  Respiratory: Negative for cough and shortness of breath.   Cardiovascular: Negative for chest pain and leg swelling.  Gastrointestinal: Negative for nausea, vomiting, abdominal pain and diarrhea.  Genitourinary: Negative for dysuria and flank pain.  Musculoskeletal: Negative for neck pain.  Skin: Negative for rash.  Allergic/Immunologic: Negative for immunocompromised state.  Neurological: Positive for seizures and headaches.      Allergies  Review of patient's allergies indicates no known allergies.  Home Medications   Prior to Admission medications   Medication Sig Start Date End Date Taking? Authorizing Provider  levETIRAcetam (KEPPRA) 500 MG tablet Take 1 tablet (500 mg total) by mouth 2 (two) times daily. 08/11/15   Eber Hong, MD  levonorgestrel (MIRENA) 20 MCG/24HR IUD 1 each by Intrauterine route once.    Historical Provider, MD  nystatin (NYSTATIN) powder Apply topically 4 (four) times daily. Patient taking differently: Apply topically as needed.  07/19/15   Jacklyn Shell, CNM  solifenacin (VESICARE) 10 MG tablet Take 1 tablet (10 mg total) by mouth daily. 08/09/15   Lazaro Arms, MD   BP 105/69 mmHg  Pulse 65  Temp(Src) 98.2 F (36.8 C) (Oral)  Resp 18  Ht  (1.575 m)  Wt 215 lb (97.523 kg)  BMI 39.31 kg/m2  SpO2 100% Physical Exam  Constitutional: She appears well-developed and well-nourished. No distress.  HENT:  Head: Normocephalic and atraumatic.  Mouth/Throat: No oropharyngeal exudate.  Mild abrasion to right lateral tongue. No lacerations. No active bleeding. OP otherwise normal.  Eyes: Conjunctivae are normal. Pupils are equal, round, and reactive to light.  Neck: Normal range of motion. Neck supple.  Cardiovascular: Normal rate, regular rhythm, normal heart sounds and intact distal pulses.  Exam reveals no friction rub.   No murmur heard. Pulmonary/Chest:  Effort normal and breath sounds normal. No respiratory distress. She has no wheezes. She has no rales.  Abdominal: Soft. She exhibits no distension. There is no tenderness.  Musculoskeletal: She exhibits no edema.  Neurological: She is alert.  Skin: Skin is warm. No rash noted.  Nursing note and vitals reviewed.   Neurological Exam:  - Mental Status: Alert and oriented to person, place, and time. Attention and concentration normal. Speech clear. Recent memory is intact. - Cranial Nerves: Visual fields intact to confrontation in all quadrants bilaterally. EOMI and PERRLA. No nystagmus noted. Facial sensation intact at forehead, maxillary cheek, and chin/mandible bilaterally. No weakness of masticatory muscles. No facial asymmetry or weakness. Hearing grossly normal to finer rub. Uvula is midline, and palate elevates symmetrically. Normal SCM and trapezius strength. Tongue midline without fasciculations - Motor: Muscle strength 5/5 in proximal and distal UE and LE bilaterally. No pronator drift. Muscle tone normal. - Reflexes: 2+ and symmetrical in all four extremities.  - Sensation: Intact to light touch and pinprick in upper and lower extremities distally bilaterally.  - Gait: Normal without ataxia. - Coordination: Normal FTN and HTS bilaterally.    ED Course  Procedures (including critical care time) Labs Review Labs Reviewed  POC URINE PREG, ED    Imaging Review No results found. I have personally reviewed and evaluated these images and lab results as part of my medical decision-making.   EKG Interpretation   Date/Time:  Tuesday September 25 2015 06:54:01 EDT Ventricular Rate:  75 PR Interval:    QRS Duration: 99 QT Interval:  402 QTC Calculation: 449 R Axis:   115 Text Interpretation:  Sinus rhythm Right axis deviation Low voltage,  precordial leads Baseline wander in lead(s) V1 No previous ECGs available  Confirmed by ZACKOWSKI  MD, SCOTT [phone]) on 09/25/2015 8:36:49 AM       MDM  30 yo F with PMHx of seizures who presents with recurrent, <5 minute, generalized seizure. On arrival, VSS and WNL/at baseline. Neuro exam is non-focal and pt is mentating at baseline. She c/o mild HA which is usual after seizures per her report and review of prior ED visits. Suspect this is 2/2 medical nonadherence. POC glucose WNL with EMS. She is rx'ed keppra 500 BID but is only taking it once daily, at night, and missed "one or two" doses, including last night. No recent fevers, chills, or illnesses. No signs of meningitis or encephalitis. No focal neuro deficits to suggest intracranial mass or lesion. Will load with IV keppra, give fluids/tylenol, and re-assess. Pt is currently scheduling appt for Neurologist with [employer] in the next several weeks.   Patient remains neurologically intact and feels well after fluids and Keppra. No further seizure-like activity. I discussed the importance of strict adherence with her antiepileptic regimen in detail. As mentioned, she has a neurologist appointment scheduled. Will advise her to continue Keppra twice daily and she assures me that she has an adequate prescription. Return precautions were given. I encouraged the patient not to drive and gave strict seizure precautions. Discharged with family in stable condition.  Clinical Impression: 1. Seizure (HCC)   2. Patient nonadherence     Disposition: Discharge  Condition: Good  I have discussed the results, Dx and Tx plan with the pt(& family if present). He/she/they expressed understanding and agree(s) with the plan. Discharge instructions discussed at great length. Strict return precautions discussed and pt &/or family have verbalized understanding of the instructions. No further questions at time of discharge.    New Prescriptions   No medications on file    Follow Up: Beryle Beams, MD [address] [place] [address] [phone]   Call to set up a follow-up appointment in  the next 2-3 weeks to discuss your medication and increasing seizures  [facility] EMERGENCY DEPARTMENT [address] [id number] [place] [address] [phone]  As needed     Shaune Pollack, MD 09/25/15 [phone]

## 2015-09-25 NOTE — ED Notes (Signed)
Pt's children called 911 stating she was having a seizure. Pt reports she did not take her Keppra and Vesicare, stating she "forgot".

## 2015-09-25 NOTE — Discharge Instructions (Signed)
- Call your neurologist to set up an appointment in 1-2 weeks - It is very important that you take your Keppra as prescribed. Please make sure to take it TWICE daily in order to prevent seizures. - No alcohol, heavy caffeine use, or stimulants - No driving until cleared by Neurology - Do not climb ladders, operate heavy machinery, or perform other dangerous activities until cleared by Neurology   Epilepsy Epilepsy is a disorder in which a person has repeated seizures over time. A seizure is a release of abnormal electrical activity in the brain. Seizures can cause a change in attention, behavior, or the ability to remain awake and alert (altered mental status). Seizures often involve uncontrollable shaking (convulsions).  Most people with epilepsy lead normal lives. However, people with epilepsy are at an increased risk of falls, accidents, and injuries. Therefore, it is important to begin treatment right away. CAUSES  Epilepsy has many possible causes. Anything that disturbs the normal pattern of brain cell activity can lead to seizures. This may include:   Head injury.  Birth trauma.  High fever as a child.  Stroke.  Bleeding into or around the brain.  Certain drugs.  Prolonged low oxygen, such as what occurs after CPR efforts.  Abnormal brain development.  Certain illnesses, such as meningitis, encephalitis (brain infection), malaria, and other infections.  An imbalance of nerve signaling chemicals (neurotransmitters).  SIGNS AND SYMPTOMS  The symptoms of a seizure can vary greatly from one person to another. Right before a seizure, you may have a warning (aura) that a seizure is about to occur. An aura may include the following symptoms:  Fear or anxiety.  Nausea.  Feeling like the room is spinning (vertigo).  Vision changes, such as seeing flashing lights or spots. Common symptoms during a seizure include:  Abnormal sensations, such as an abnormal smell or a bitter  taste in the mouth.   Sudden, general body stiffness.   Convulsions that involve rhythmic jerking of the face, arm, or leg on one or both sides.   Sudden change in consciousness.   Appearing to be awake but not responding.   Appearing to be asleep but cannot be awakened.   Grimacing, chewing, lip smacking, drooling, tongue biting, or loss of bowel or bladder control. After a seizure, you may feel sleepy for a while. DIAGNOSIS  Your health care provider will ask about your symptoms and take a medical history. Descriptions from any witnesses to your seizures will be very helpful in the diagnosis. A physical exam, including a detailed neurological exam, is necessary. Various tests may be done, such as:   An electroencephalogram (EEG). This is a painless test of your brain waves. In this test, a diagram is created of your brain waves. These diagrams can be interpreted by a specialist.  An MRI of the brain.   A CT scan of the brain.   A spinal tap (lumbar puncture, LP).  Blood tests to check for signs of infection or abnormal blood chemistry. TREATMENT  There is no cure for epilepsy, but it is generally treatable. Once epilepsy is diagnosed, it is important to begin treatment as soon as possible. For most people with epilepsy, seizures can be controlled with medicines. The following may also be used:  A pacemaker for the brain (vagus nerve stimulator) can be used for people with seizures that are not well controlled by medicine.  Surgery on the brain. For some people, epilepsy eventually goes away. HOME CARE INSTRUCTIONS  Follow your health care provider's recommendations on driving and safety in normal activities.  Get enough rest. Lack of sleep can cause seizures.  Only take over-the-counter or prescription medicines as directed by your health care provider. Take any prescribed medicine exactly as directed.  Avoid any known triggers of your seizures.  Keep a seizure  diary. Record what you recall about any seizure, especially any possible trigger.   Make sure the people you live and work with know that you are prone to seizures. They should receive instructions on how to help you. In general, a witness to a seizure should:   Cushion your head and body.   Turn you on your side.   Avoid unnecessarily restraining you.   Not place anything inside your mouth.   Call for emergency medical help if there is any question about what has occurred.   Follow up with your health care provider as directed. You may need regular blood tests to monitor the levels of your medicine.  SEEK MEDICAL CARE IF:   You develop signs of infection or other illness. This might increase the risk of a seizure.   You seem to be having more frequent seizures.   Your seizure pattern is changing.  SEEK IMMEDIATE MEDICAL CARE IF:   You have a seizure that does not stop after a few moments.   You have a seizure that causes any difficulty in breathing.   You have a seizure that results in a very severe headache.   You have a seizure that leaves you with the inability to speak or use a part of your body.    This information is not intended to replace advice given to you by your health care provider. Make sure you discuss any questions you have with your health care provider.   Document Released: 02/24/2005 Document Revised: 12/15/2012 Document Reviewed: 10/06/2012 Elsevier Interactive Patient Education [employer].

## 2015-10-08 ENCOUNTER — Other Ambulatory Visit: Payer: Self-pay | Admitting: Neurology

## 2015-10-08 DIAGNOSIS — R569 Unspecified convulsions: Secondary | ICD-10-CM

## 2015-10-16 ENCOUNTER — Emergency Department (HOSPITAL_COMMUNITY): Payer: Medicaid Other

## 2015-10-16 ENCOUNTER — Encounter (HOSPITAL_COMMUNITY): Payer: Self-pay | Admitting: Emergency Medicine

## 2015-10-16 ENCOUNTER — Emergency Department (HOSPITAL_COMMUNITY)
Admission: EM | Admit: 2015-10-16 | Discharge: 2015-10-16 | Disposition: A | Discharge status: Home / Self Care | Payer: Medicaid Other | Attending: Emergency Medicine | Admitting: Emergency Medicine

## 2015-10-16 DIAGNOSIS — W228XXA Striking against or struck by other objects, initial encounter: Secondary | ICD-10-CM | POA: Diagnosis not present

## 2015-10-16 DIAGNOSIS — Y999 Unspecified external cause status: Secondary | ICD-10-CM | POA: Diagnosis not present

## 2015-10-16 DIAGNOSIS — Y939 Activity, unspecified: Secondary | ICD-10-CM | POA: Insufficient documentation

## 2015-10-16 DIAGNOSIS — Z79899 Other long term (current) drug therapy: Secondary | ICD-10-CM | POA: Diagnosis not present

## 2015-10-16 DIAGNOSIS — Y929 Unspecified place or not applicable: Secondary | ICD-10-CM | POA: Diagnosis not present

## 2015-10-16 DIAGNOSIS — M79674 Pain in right toe(s): Secondary | ICD-10-CM | POA: Diagnosis present

## 2015-10-16 DIAGNOSIS — S99921A Unspecified injury of right foot, initial encounter: Secondary | ICD-10-CM

## 2015-10-16 MED ORDER — CEPHALEXIN 500 MG PO CAPS: 500.0000 mg | ORAL_CAPSULE | Freq: Three times a day (TID) | ORAL | 0 refills | Status: DC

## 2015-10-16 NOTE — ED Triage Notes (Signed)
Patient states "I scraped my right big toe Saturday on cement."

## 2015-10-16 NOTE — Discharge Instructions (Signed)
Clean the wound with mild soap and water and change the gauze dressing daily for 5 days.  Follow-up with your primary doctor for recheck or return for any signs of infection.  Ibuprofen 800 mg 3 times a day as needed for pain

## 2015-10-18 NOTE — ED Provider Notes (Signed)
[facility]-EM[facility]Y DEPT Provider Note   CSN: [id number] Arrival date & time: 10/16/15  1101  First Provider Contact:  First MD Initiated Contact with Patient 10/16/15 1141        History   Chief Complaint Chief Complaint  Patient presents with  . Toe Pain    HPI Alexandra Garrett is a 30 y.o. female.  HPI  Alexandra Garrett is a 30 y.o. female who presents to the Emergency Department complaining of laceration to the tip of the right great toe.  States she scraped her toe on cement three days ago.  She has been cleaning the wound and applying a first aid ointment.  She requests evaluation for possible infection.  She denies bleeding, numbness, redness or drainage.  Td is up to date.  Past Medical History:  Diagnosis Date  . Anemia   . Seizures (HCC)     There are no active problems to display for this patient.   History reviewed. No pertinent surgical history.  OB History    No data available       Home Medications    Prior to Admission medications   Medication Sig Start Date End Date Taking? Authorizing Provider  ibuprofen (ADVIL,MOTRIN) 200 MG tablet Take 400 mg by mouth every 6 (six) hours as needed for moderate pain.   Yes Historical Provider, MD  levETIRAcetam (KEPPRA) 500 MG tablet Take 1 tablet (500 mg total) by mouth 2 (two) times daily. 08/11/15  Yes Eber HongBrian Miller, MD  levonorgestrel (MIRENA) 20 MCG/24HR IUD 1 each by Intrauterine route once.   Yes Historical Provider, MD  naproxen sodium (ANAPROX) 220 MG tablet Take 440 mg by mouth daily as needed (pain).   Yes Historical Provider, MD  nystatin (NYSTATIN) powder Apply topically 4 (four) times daily. Patient taking differently: Apply topically as needed.  07/19/15  Yes Jacklyn ShellFrances Cresenzo-Dishmon, CNM  solifenacin (VESICARE) 10 MG tablet Take 1 tablet (10 mg total) by mouth daily. 08/09/15  Yes Lazaro ArmsLuther H Eure, MD  cephALEXin (KEFLEX) 500 MG capsule Take 1 capsule (500 mg total) by mouth 3 (three) times daily. For 7 days  10/16/15   Pauline Ausammy Aidynn Krenn, PA-C    Family History Family History  Problem Relation Age of Onset  . Arthritis Mother   . Cancer Mother     lymph nodes  . Depression Mother   . Hypertension Mother   . Heart disease Father   . Arthritis Maternal Grandmother   . Cancer Maternal Grandmother     breast  . Diabetes Maternal Grandmother   . Cancer Maternal Grandfather     lung  . Stroke Paternal Grandmother   . Heart disease Paternal Grandfather   . Cancer Paternal Aunt     breast    Social History Social History  Substance Use Topics  . Smoking status: Never Smoker  . Smokeless tobacco: Never Used  . Alcohol use No     Allergies   Review of patient's allergies indicates no known allergies.   Review of Systems Review of Systems  Constitutional: Negative for chills and fever.  Musculoskeletal: Negative for arthralgias, back pain and joint swelling.  Skin: Positive for wound.       Laceration right great toe  Neurological: Negative for dizziness, weakness and numbness.  Hematological: Does not bruise/bleed easily.  All other systems reviewed and are negative.    Physical Exam Updated Vital Signs BP 111/72   Pulse 77   Temp 98.9 F (37.2 C)   Resp 18  Ht  (1.575 m)   Wt 102.5 kg   SpO2 97%   BMI 41.34 kg/m   Physical Exam  Constitutional: She is oriented to person, place, and time. She appears well-developed and well-nourished. No distress.  HENT:  Head: Normocephalic and atraumatic.  Cardiovascular: Normal rate, regular rhythm and intact distal pulses.   Pulmonary/Chest: Effort normal and breath sounds normal. No respiratory distress.  Musculoskeletal: She exhibits no edema or tenderness.  Neurological: She is alert and oriented to person, place, and time. She exhibits normal muscle tone. Coordination normal.  Skin: Skin is warm. Laceration noted.  superificial flap type laceration to the distal tip of the right great toe.  No erythema, edema or  drainage.     Nursing note and vitals reviewed.    ED Treatments / Results  Labs (all labs ordered are listed, but only abnormal results are displayed) Labs Reviewed - No data to display  EKG  EKG Interpretation None       Radiology Dg Toe Great Right  Result Date: 10/16/2015 CLINICAL DATA:  Laceration after scraping toe on cement 3 days ago EXAM: RIGHT GREAT TOE COMPARISON:  None. FINDINGS: No fracture or dislocation. Tiny focus of high attenuation along the lateral aspect of the toenail could be from recent trauma. No foreign body seen within the soft tissues. IMPRESSION: No fracture or dislocation.  No soft tissue foreign body. Electronically Signed   By: Gerome Sam III M.D   On: 10/16/2015 12:07    Procedures Procedures (including critical care time)  Medications Ordered in ED Medications - No data to display   Initial Impression / Assessment and Plan / ED Course  I have reviewed the triage vital signs and the nursing notes.  Pertinent labs & imaging results that were available during my care of the patient were reviewed by me and considered in my medical decision making (see chart for details).  Clinical Course    Superficial skin laceration to distal tip of the toe, > 12 hrs old.  No clinical signs of infection.  Wound cleaned, bandaged, and post op shoe applied. I will prescribe keflex and wound infection precautions given.   Final Clinical Impressions(s) / ED Diagnoses   Final diagnoses:  Toe injury, right, initial encounter    New Prescriptions Discharge Medication List as of 10/16/2015 12:28 PM    START taking these medications   Details  cephALEXin (KEFLEX) 500 MG capsule Take 1 capsule (500 mg total) by mouth 3 (three) times daily. For 7 days, Starting Tue 10/16/2015, Print         Anahit Klumb Lisbon, PA-C 10/18/15 2114    Geoffery Lyons, MD 10/22/15 [phone]

## 2015-10-19 ENCOUNTER — Ambulatory Visit (HOSPITAL_COMMUNITY)
Admission: RE | Admit: 2015-10-19 | Discharge: 2015-10-19 | Disposition: A | Discharge status: Home / Self Care | Payer: Medicaid Other | Source: Ambulatory Visit | Attending: Neurology | Admitting: Neurology

## 2015-10-19 DIAGNOSIS — Q048 Other specified congenital malformations of brain: Secondary | ICD-10-CM | POA: Diagnosis not present

## 2015-10-19 DIAGNOSIS — R569 Unspecified convulsions: Secondary | ICD-10-CM

## 2015-10-19 MED ORDER — GADOBENATE DIMEGLUMINE 529 MG/ML IV SOLN
20.0000 mL | Freq: Once | INTRAVENOUS | Status: AC | PRN
  Administered 2015-10-19: 20 mL via INTRAVENOUS

## 2015-11-25 ENCOUNTER — Emergency Department (HOSPITAL_COMMUNITY)
Admission: EM | Admit: 2015-11-25 | Discharge: 2015-11-25 | Disposition: A | Discharge status: Home / Self Care | Payer: Medicaid Other | Attending: Emergency Medicine | Admitting: Emergency Medicine

## 2015-11-25 ENCOUNTER — Encounter (HOSPITAL_COMMUNITY): Payer: Self-pay | Admitting: Emergency Medicine

## 2015-11-25 DIAGNOSIS — R569 Unspecified convulsions: Secondary | ICD-10-CM

## 2015-11-25 DIAGNOSIS — G40909 Epilepsy, unspecified, not intractable, without status epilepticus: Secondary | ICD-10-CM | POA: Diagnosis not present

## 2015-11-25 DIAGNOSIS — Z9119 Patient's noncompliance with other medical treatment and regimen: Secondary | ICD-10-CM

## 2015-11-25 DIAGNOSIS — Z91199 Patient's noncompliance with other medical treatment and regimen due to unspecified reason: Secondary | ICD-10-CM

## 2015-11-25 LAB — BASIC METABOLIC PANEL
Anion gap: 1 — ABNORMAL LOW (ref 5–15)
BUN: 15 mg/dL (ref 6–20)
CALCIUM: 8.4 mg/dL — AB (ref 8.9–10.3)
CHLORIDE: 107 mmol/L (ref 101–111)
CO2: 24 mmol/L (ref 22–32)
CREATININE: 0.87 mg/dL (ref 0.44–1.00)
GFR calc Af Amer: >60 mL/min (ref >60)
GFR calc non Af Amer: >60 mL/min (ref >60)
GLUCOSE: 102 mg/dL — AB (ref 65–99)
Potassium: 3.4 mmol/L — ABNORMAL LOW (ref 3.5–5.1)
Sodium: 132 mmol/L — ABNORMAL LOW (ref 135–145)

## 2015-11-25 MED ORDER — ACETAMINOPHEN 325 MG PO TABS
650.0000 mg | ORAL_TABLET | Freq: Once | ORAL | Status: AC
  Administered 2015-11-25: 650 mg via ORAL
  Filled 2015-11-25: qty 2

## 2015-11-25 MED ORDER — LEVETIRACETAM IN NACL 1000 MG/100ML IV SOLN
1000.0000 mg | Freq: Once | INTRAVENOUS | Status: AC
  Administered 2015-11-25: 1000 mg via INTRAVENOUS
  Filled 2015-11-25: qty 100

## 2015-11-25 MED ORDER — LEVETIRACETAM 500 MG PO TABS: 500.0000 mg | ORAL_TABLET | Freq: Two times a day (BID) | ORAL | 1 refills | Status: DC

## 2015-11-25 NOTE — ED Provider Notes (Signed)
[facility] DE[facility] Provider Note   CSN: [id number] Arrival date & time: 11/25/15  0055  By signing my name below, I, Javier Dockerobert Ryan Halas, attest that this documentation has been prepared under the direction and in the presence of Devoria AlbeIva Vicent Febles, MD. Electronically Signed: Javier Dockerobert Ryan Halas, ER Scribe. 10/20/2015. 1:02 AM.  Time Seen 12:47 AM   History   Chief Complaint Chief Complaint  Patient presents with  . Febrile Seizure   The history is provided by the patient and a relative. No language interpreter was used.    HPI Comments: Alexandra Garrett is a 30 y.o. female with a past hx of seizure disorder who presents to the Emergency Department complaining of having a seizure one hour ago. She ran out of her siezure medication three days ago and planned to refill it Monday when she got paid. She currently has a HA which is typical after a seizure. She denies blurred vision or other visual disturbance. Her regular medications are vecicare bladder medicine, anaprox and nystatin. She does not smoke or drink. She denies pain in arms or chest. She has some soreness in her lower lip, otherwise no injury from the seizure. The seizure was witnessed by her 2 small children who state they heard her making a lot of noise and then she was jerking all over followed by a postictal state with sonorous respirations they state then she woke up and started talking to them.  PCP [facility] Gerilyn [place]  Past Medical History:  Diagnosis Date  . Anemia   . Seizures (HCC)     There are no active problems to display for this patient.   No past surgical history on file.  OB History    No data available       Home Medications    Prior to Admission medications   Medication Sig Start Date End Date Taking? Authorizing Provider  cephALEXin (KEFLEX) 500 MG capsule Take 1 capsule (500 mg total) by mouth 3 (three) times daily. For 7 days 10/16/15   Tammy Triplett, PA-C  ibuprofen (ADVIL,MOTRIN)  200 MG tablet Take 400 mg by mouth every 6 (six) hours as needed for moderate pain.    Historical Provider, MD  levETIRAcetam (KEPPRA) 500 MG tablet Take 1 tablet (500 mg total) by mouth 2 (two) times daily. 11/25/15   Devoria AlbeIva Shadrack Brummitt, MD  levonorgestrel (MIRENA) 20 MCG/24HR IUD 1 each by Intrauterine route once.    Historical Provider, MD  naproxen sodium (ANAPROX) 220 MG tablet Take 440 mg by mouth daily as needed (pain).    Historical Provider, MD  nystatin (NYSTATIN) powder Apply topically 4 (four) times daily. Patient taking differently: Apply topically as needed.  07/19/15   Jacklyn ShellFrances Cresenzo-Dishmon, CNM  solifenacin (VESICARE) 10 MG tablet Take 1 tablet (10 mg total) by mouth daily. 08/09/15   Lazaro ArmsLuther H Eure, MD    Family History Family History  Problem Relation Age of Onset  . Arthritis Mother   . Cancer Mother     lymph nodes  . Depression Mother   . Hypertension Mother   . Heart disease Father   . Arthritis Maternal Grandmother   . Cancer Maternal Grandmother     breast  . Diabetes Maternal Grandmother   . Cancer Maternal Grandfather     lung  . Stroke Paternal Grandmother   . Heart disease Paternal Grandfather   . Cancer Paternal Aunt     breast    Social History Social History  Substance  Use Topics  . Smoking status: Never Smoker  . Smokeless tobacco: Never Used  . Alcohol use No  Employed   Allergies   Review of patient's allergies indicates no known allergies.   Review of Systems Review of Systems  Constitutional: Negative for chills and fever.  Eyes: Negative for photophobia and visual disturbance.  Neurological: Negative for weakness and numbness.       Seizure   All other systems reviewed and are negative.    Physical Exam Updated Vital Signs BP 117/71 (BP Location: Left Arm)   Pulse 99   Temp 98.3 F (36.8 C) (Oral)   Resp 18   Ht 5\' 2"  (1.575 m)   Wt 200 lb (90.7 kg)   SpO2 97%   BMI 36.58 kg/m   Vital signs normal    Physical Exam    Constitutional: She is oriented to person, place, and time. She appears well-developed and well-nourished.  Non-toxic appearance. She does not appear ill. No distress.  HENT:  Head: Normocephalic and atraumatic.  Right Ear: External ear normal.  Left Ear: External ear normal.  Nose: Nose normal. No mucosal edema or rhinorrhea.  Mouth/Throat: Oropharynx is clear and moist and mucous membranes are normal. No dental abscesses or uvula swelling.  Eyes: Conjunctivae and EOM are normal. Pupils are equal, round, and reactive to light.  Neck: Normal range of motion and full passive range of motion without pain. Neck supple.  Cardiovascular: Normal rate, regular rhythm and normal heart sounds.  Exam reveals no gallop and no friction rub.   No murmur heard. Pulmonary/Chest: Effort normal and breath sounds normal. No respiratory distress. She has no wheezes. She has no rhonchi. She has no rales. She exhibits no tenderness and no crepitus.  Abdominal: Soft. Normal appearance and bowel sounds are normal. She exhibits no distension. There is no tenderness. There is no rebound and no guarding.  Musculoskeletal: Normal range of motion. She exhibits no edema or tenderness.  Moves all extremities well.   Neurological: She is alert and oriented to person, place, and time. She has normal strength. No cranial nerve deficit. Coordination normal.  Skin: Skin is warm, dry and intact. No rash noted. She is not diaphoretic. No erythema. No pallor.  Bruising of inner lower lip.  Psychiatric: She has a normal mood and affect. Her speech is normal and behavior is normal. Her mood appears not anxious.  Nursing note and vitals reviewed.    ED Treatments / Results  Labs (all labs ordered are listed, but only abnormal results are displayed) Results for orders placed or performed during the hospital encounter of 11/25/15  Basic metabolic panel  Result Value Ref Range   Sodium 132 (L) 135 - 145 mmol/L   Potassium 3.4  (L) 3.5 - 5.1 mmol/L   Chloride 107 101 - 111 mmol/L   CO2 24 22 - 32 mmol/L   Glucose, Bld 102 (H) 65 - 99 mg/dL   BUN 15 6 - 20 mg/dL   Creatinine, Ser 1.19 0.44 - 1.00 mg/dL   Calcium 8.4 (L) 8.9 - 10.3 mg/dL   GFR calc non Af Amer >60 >60 mL/min   GFR calc Af Amer >60 >60 mL/min   Anion gap 1 (L) 5 - 15   Laboratory interpretation all normal except hypokalemia and hyponatremia    Procedures Procedures (including critical care time)  Medications Ordered in ED Medications  levETIRAcetam (KEPPRA) IVPB 1000 mg/100 mL premix (1,000 mg Intravenous Given 11/25/15 0116)  acetaminophen (TYLENOL)  tablet 650 mg (650 mg Oral Given 11/25/15 0211)     Initial Impression / Assessment and Plan / ED Course  I have reviewed the triage vital signs and the nursing notes.  Pertinent labs & imaging results that were available during my care of the patient were reviewed by me and considered in my medical decision making (see chart for details).  Clinical Course   Patient was giving a loading dose of Keppra. She was given a prescription to fill hopefully this weekend. We discussed she should not drive until she is cleared by her neurologist again. She states he had cleared her earlier. She states "I only have seizures in my sleep". However, I feel her neurologist should clear her before she drives again.  Pt was given oral acetaminophen for her post-seizure headache. Her sister is here to drive patient and her 2 sons home.   Final Clinical Impressions(s) / ED Diagnoses   Final diagnoses:  Seizure (HCC)  Medically noncompliant    New Prescriptions Current Discharge Medication List    CONTINUE these medications which have CHANGED   Details  levETIRAcetam (KEPPRA) 500 MG tablet Take 1 tablet (500 mg total) by mouth 2 (two) times daily. Qty: 60 tablet, Refills: 1       Plan discharge  Devoria Albe, MD, FACEP   I personally performed the services described in this documentation, which was  scribed in my presence. The recorded information has been reviewed and considered.  Devoria Albe, MD, Concha Pyo, MD 11/25/15 [phone]

## 2015-11-25 NOTE — Discharge Instructions (Signed)
YOU SHOULD NOT DRIVE UNTIL CLEARED TO [facility]IVE [facility]!!! Take your seizure medication every day.

## 2015-11-25 NOTE — ED Triage Notes (Signed)
Pt reports missed med- tonight asleep and children noticed she was having a seizure and called 911

## 2015-11-25 NOTE — ED Notes (Signed)
Pt is A&O, is not incontinent, is conversant watching TV with her 2 sons.  She reports that she has missed 2 doses of her Keppra and reports that her last seizure was one month ago.

## 2015-11-25 NOTE — ED Notes (Signed)
keppra completed, friend at bedside and she and pt are conversant

## 2015-11-30 ENCOUNTER — Encounter (INDEPENDENT_AMBULATORY_CARE_PROVIDER_SITE_OTHER): Payer: Self-pay

## 2015-11-30 ENCOUNTER — Ambulatory Visit: Payer: Medicaid Other | Attending: Neurology | Admitting: Neurology

## 2015-11-30 VITALS — Ht 62.0 in | Wt 200.0 lb

## 2015-11-30 DIAGNOSIS — G4730 Sleep apnea, unspecified: Secondary | ICD-10-CM

## 2015-11-30 DIAGNOSIS — G4710 Hypersomnia, unspecified: Secondary | ICD-10-CM

## 2015-11-30 DIAGNOSIS — G4733 Obstructive sleep apnea (adult) (pediatric): Secondary | ICD-10-CM | POA: Insufficient documentation

## 2015-12-05 ENCOUNTER — Other Ambulatory Visit (HOSPITAL_BASED_OUTPATIENT_CLINIC_OR_DEPARTMENT_OTHER): Payer: Self-pay

## 2015-12-05 DIAGNOSIS — G4733 Obstructive sleep apnea (adult) (pediatric): Secondary | ICD-10-CM

## 2015-12-14 NOTE — Procedures (Signed)
  [facility] Tiarna Koppen A. Gerilyn Pilgrimoonqu[place], MD     [email or web address]             NOCTURNAL POLYSOMNOGRAPHY   LOCATION: [facility]   Patient Name: Alexandra Garrett, Alexandra Garrett Date: 11/30/2015 Gender: Female D.O.B: 1985/08/20 Age (years): 29 Referring Provider: Beryle BeamsKofi Ajna Moors MD, ABSM Height (inches): 62 Interpreting Physician: Beryle BeamsKofi Lorelie Biermann MD, ABSM Weight (lbs): 200 RPSGT: Peak, Robert BMI: 37 MRN: [id number] Neck Size: 17.00 CLINICAL INFORMATION Sleep Garrett Type: NPSG Indication for sleep Garrett: N/A Epworth Sleepiness Score: 12 SLEEP Garrett TECHNIQUE As per the AASM Manual for the Scoring of Sleep and Associated Events v2.3 (April 2016) with a hypopnea requiring 4% desaturations. The channels recorded and monitored were frontal, central and occipital EEG, electrooculogram (EOG), submentalis EMG (chin), nasal and oral airflow, thoracic and abdominal wall motion, anterior tibialis EMG, snore microphone, electrocardiogram, and pulse oximetry. MEDICATIONS Patient's medications include: N/A. Medications self-administered by patient during sleep Garrett : No sleep medicine administered.  Current Outpatient Prescriptions:  .  cephALEXin (KEFLEX) 500 MG capsule, Take 1 capsule (500 mg total) by mouth 3 (three) times daily. For 7 days, Disp: 21 capsule, Rfl: 0 .  ibuprofen (ADVIL,MOTRIN) 200 MG tablet, Take 400 mg by mouth every 6 (six) hours as needed for moderate pain., Disp: , Rfl:  .  levETIRAcetam (KEPPRA) 500 MG tablet, Take 1 tablet (500 mg total) by mouth 2 (two) times daily., Disp: 60 tablet, Rfl: 1 .  levonorgestrel (MIRENA) 20 MCG/24HR IUD, 1 each by Intrauterine route once., Disp: , Rfl:  .  naproxen sodium (ANAPROX) 220 MG tablet, Take 440 mg by mouth daily as needed (pain)., Disp: , Rfl:  .  nystatin (NYSTATIN) powder, Apply topically 4 (four) times daily. (Patient taking differently: Apply topically as needed. ), Disp: 15 g, Rfl: 3 .  solifenacin (VESICARE) 10 MG tablet, Take 1  tablet (10 mg total) by mouth daily., Disp: 30 tablet, Rfl: 11  SLEEP ARCHITECTURE The Garrett was initiated at 9:46:51 PM and ended at 5:13:19 AM. Sleep onset time was 40.9 minutes and the sleep efficiency was 87.1%. The total sleep time was 389.0 minutes. Stage REM latency was 171.5 minutes. The patient spent 1.67% of the night in stage N1 sleep, 64.65% in stage N2 sleep, 10.80% in stage N3 and 22.88% in REM. Alpha intrusion was absent. Supine sleep was 0.05%. RESPIRATORY PARAMETERS The overall apnea/hypopnea index (AHI) was 0.6 per hour. There were 0 total apneas, including 0 obstructive, 0 central and 0 mixed apneas. There were 4 hypopneas and 0 RERAs. The AHI during Stage REM sleep was 2.7 per hour. AHI while supine was 0.0 per hour. The mean oxygen saturation was 95.17%. The minimum SpO2 during sleep was 91.00%. Moderate snoring was noted during this Garrett. CARDIAC DATA The 2 lead EKG demonstrated sinus rhythm. The mean heart rate was 54.23 beats per minute. Other EKG findings include: None. LEG MOVEMENT DATA The total PLMS were 0 with a resulting PLMS index of 0.00. Associated arousal with leg movement index was 0.0.   IMPRESSIONS - No significant obstructive sleep apnea occurred during this Garrett. - No significant central sleep apnea occurred during this Garrett. - Clinically significant periodic limb movements did not occur during sleep. No significant associated arousals.  Argie RammingKofi A Riccardo Holeman, MD Diplomate, American Board of Sleep Medicine.

## 2015-12-19 ENCOUNTER — Encounter: Payer: Self-pay | Admitting: Advanced Practice Midwife

## 2015-12-19 ENCOUNTER — Ambulatory Visit (INDEPENDENT_AMBULATORY_CARE_PROVIDER_SITE_OTHER): Payer: Medicaid Other | Admitting: Advanced Practice Midwife

## 2015-12-19 VITALS — BP 120/80 | HR 76 | Ht 62.0 in | Wt 222.0 lb

## 2015-12-19 DIAGNOSIS — Z3202 Encounter for pregnancy test, result negative: Secondary | ICD-10-CM

## 2015-12-19 DIAGNOSIS — Z30432 Encounter for removal of intrauterine contraceptive device: Secondary | ICD-10-CM | POA: Diagnosis not present

## 2015-12-19 DIAGNOSIS — Z30430 Encounter for insertion of intrauterine contraceptive device: Secondary | ICD-10-CM | POA: Diagnosis not present

## 2015-12-19 DIAGNOSIS — Z30014 Encounter for initial prescription of intrauterine contraceptive device: Secondary | ICD-10-CM | POA: Diagnosis not present

## 2015-12-19 DIAGNOSIS — R569 Unspecified convulsions: Secondary | ICD-10-CM

## 2015-12-19 LAB — POCT URINE PREGNANCY: PREG TEST UR: NEGATIVE

## 2015-12-19 NOTE — Progress Notes (Signed)
[name] is a 30 y.o. year old  female   who presents for removal and replacement of her IUD. The new IUD is Mirena. It has been 5 years since her previous IUD placement.  The risks and benefits of the method and placement have been thouroughly reviewed with the patient and all questions were answered.  Specifically the patient is aware of failure rate of 03/998, expulsion of the IUD and of possible perforation.  The patient is aware of irregular bleeding due to the method and understands the incidence of irregular bleeding diminishes with time.  Time out was performed.  A Graves speculum was placed.  The cervix was prepped using Betadine. The strings were found to be  visible.   They were grasped and the Mirena was easily removed. The cervix was then grasped with a tenaculum and the uterus was sounded to 7 cm. The IUD was inserted to 7 cm.  It was pulled back 1 cm and the IUD was disengaged. After 15 seconds, the IUD was placed in the fundus.  The strings were trimmed to 3 cm.  Pt got a call from her son's school that he is sick so could not stay for US confirmation of proper placement.    The patient was instructed on signs and symptoms of infection and to check for the strings after each menses or each month.  The patient is to refrain from intercourse for 3 days.(not having sex F/U 4 weeks

## 2016-01-05 ENCOUNTER — Emergency Department (HOSPITAL_COMMUNITY)
Admission: EM | Admit: 2016-01-05 | Discharge: 2016-01-05 | Disposition: A | Discharge status: Home / Self Care | Payer: Medicaid Other | Attending: Emergency Medicine | Admitting: Emergency Medicine

## 2016-01-05 ENCOUNTER — Encounter (HOSPITAL_COMMUNITY): Payer: Self-pay

## 2016-01-05 DIAGNOSIS — Z791 Long term (current) use of non-steroidal anti-inflammatories (NSAID): Secondary | ICD-10-CM | POA: Insufficient documentation

## 2016-01-05 DIAGNOSIS — R569 Unspecified convulsions: Secondary | ICD-10-CM

## 2016-01-05 DIAGNOSIS — G40909 Epilepsy, unspecified, not intractable, without status epilepticus: Secondary | ICD-10-CM | POA: Diagnosis not present

## 2016-01-05 DIAGNOSIS — Z79899 Other long term (current) drug therapy: Secondary | ICD-10-CM | POA: Diagnosis not present

## 2016-01-05 LAB — COMPREHENSIVE METABOLIC PANEL
ALBUMIN: 3.8 g/dL (ref 3.5–5.0)
ALT: 17 U/L (ref 14–54)
ANION GAP: 4 — AB (ref 5–15)
AST: 19 U/L (ref 15–41)
Alkaline Phosphatase: 79 U/L (ref 38–126)
BUN: 14 mg/dL (ref 6–20)
CHLORIDE: 107 mmol/L (ref 101–111)
CO2: 23 mmol/L (ref 22–32)
CREATININE: 0.85 mg/dL (ref 0.44–1.00)
Calcium: 8.6 mg/dL — ABNORMAL LOW (ref 8.9–10.3)
GLUCOSE: 91 mg/dL (ref 65–99)
Potassium: 3.8 mmol/L (ref 3.5–5.1)
SODIUM: 134 mmol/L — AB (ref 135–145)
Total Bilirubin: 1.2 mg/dL (ref 0.3–1.2)
Total Protein: 7.2 g/dL (ref 6.5–8.1)

## 2016-01-05 LAB — CBC WITH DIFFERENTIAL/PLATELET
BASOS ABS: 0.1 10*3/uL (ref 0.0–0.1)
BASOS PCT: 1 %
EOS ABS: 0.1 10*3/uL (ref 0.0–0.7)
EOS PCT: 1 %
HCT: 41.4 % (ref 36.0–46.0)
Hemoglobin: 13.9 g/dL (ref 12.0–15.0)
LYMPHS ABS: 3.8 10*3/uL (ref 0.7–4.0)
Lymphocytes Relative: 37 %
MCH: 28.9 pg (ref 26.0–34.0)
MCHC: 33.6 g/dL (ref 30.0–36.0)
MCV: 86.1 fL (ref 78.0–100.0)
Monocytes Absolute: 0.7 10*3/uL (ref 0.1–1.0)
Monocytes Relative: 7 %
NEUTROS PCT: 54 %
Neutro Abs: 5.5 10*3/uL (ref 1.7–7.7)
PLATELETS: 312 10*3/uL (ref 150–400)
RBC: 4.81 MIL/uL (ref 3.87–5.11)
RDW: 13.8 % (ref 11.5–15.5)
WBC: 10.1 10*3/uL (ref 4.0–10.5)

## 2016-01-05 LAB — CBG MONITORING, ED: GLUCOSE-CAPILLARY: 77 mg/dL (ref 65–99)

## 2016-01-05 LAB — I-STAT BETA HCG BLOOD, ED (MC, WL, AP ONLY): I-stat hCG, quantitative: <5 m[IU]/mL (ref <5)

## 2016-01-05 MED ORDER — LEVETIRACETAM 500 MG PO TABS: 500.0000 mg | ORAL_TABLET | Freq: Two times a day (BID) | ORAL | 3 refills | Status: DC

## 2016-01-05 MED ORDER — SODIUM CHLORIDE 0.9 % IV SOLN
500.0000 mg | Freq: Once | INTRAVENOUS | Status: AC
  Administered 2016-01-05: 500 mg via INTRAVENOUS
  Filled 2016-01-05: qty 5

## 2016-01-05 MED ORDER — LEVETIRACETAM IN NACL 500 MG/100ML IV SOLN
INTRAVENOUS | Status: AC
  Filled 2016-01-05: qty 100

## 2016-01-05 NOTE — ED Notes (Signed)
MD at bedside. 

## 2016-01-05 NOTE — ED Notes (Signed)
Pt is non compliant- and states that she has missed her keppra saying that she should have a schedule. Had a seizure which awakened her son who called EMS

## 2016-01-05 NOTE — ED Provider Notes (Signed)
TIME SEEN: 5:30 AM   CHIEF COMPLAINT: Seizure  HPI: Pt is a 30 y.o. female with history of seizures who presents emergency department with a seizure tonight witnessed by her children who called 911. She is visited Keppra 500 mg twice a day but states she often forgets to take her nighttime dose. No recent fevers, cough, vomiting or diarrhea. Has felt well recently. States she has a diffuse throbbing headache which is chronic for her after her seizure. No other injury. No numbness or focal weakness.  ROS: See HPI Constitutional: no fever  Eyes: no drainage  ENT: no runny nose   Cardiovascular:  no chest pain  Resp: no SOB  GI: no vomiting GU: no dysuria Integumentary: no rash  Allergy: no hives  Musculoskeletal: no leg swelling  Neurological: no slurred speech ROS otherwise negative  PAST MEDICAL HISTORY/PAST SURGICAL HISTORY:  Past Medical History:  Diagnosis Date  . Anemia   . Seizures (HCC)     MEDICATIONS:  Prior to Admission medications   Medication Sig Start Date End Date Taking? Authorizing Provider  ibuprofen (ADVIL,MOTRIN) 200 MG tablet Take 400 mg by mouth every 6 (six) hours as needed for moderate pain.   Yes Historical Provider, MD  levETIRAcetam (KEPPRA) 500 MG tablet Take 1 tablet (500 mg total) by mouth 2 (two) times daily. 11/25/15  Yes Devoria AlbeIva Knapp, MD  levonorgestrel (MIRENA) 20 MCG/24HR IUD 1 each by Intrauterine route once.   Yes Historical Provider, MD  naproxen sodium (ANAPROX) 220 MG tablet Take 220 mg by mouth 2 (two) times daily with a meal.   Yes Historical Provider, MD  solifenacin (VESICARE) 10 MG tablet Take 1 tablet (10 mg total) by mouth daily. 08/09/15  Yes Lazaro ArmsLuther H Eure, MD  nystatin (NYSTATIN) powder Apply topically 4 (four) times daily. Patient not taking: Reported on 12/19/2015 07/19/15   Jacklyn ShellFrances Cresenzo-Dishmon, CNM    ALLERGIES:  No Known Allergies  SOCIAL HISTORY:  Social History  Substance Use Topics  . Smoking status: Never Smoker  .  Smokeless tobacco: Never Used  . Alcohol use No    FAMILY HISTORY: Family History  Problem Relation Age of Onset  . Arthritis Mother   . Cancer Mother     lymph nodes  . Depression Mother   . Hypertension Mother   . Heart disease Father   . Arthritis Maternal Grandmother   . Cancer Maternal Grandmother     breast  . Diabetes Maternal Grandmother   . Cancer Maternal Grandfather     lung  . Stroke Paternal Grandmother   . Heart disease Paternal Grandfather   . Cancer Paternal Aunt     breast    EXAM: BP 106/79 (BP Location: Left Arm)   Pulse 87   Temp 98.1 F (36.7 C) (Oral)   Resp 20   Ht 5\' 2"  (1.575 m)   Wt 220 lb (99.8 kg)   SpO2 96%   BMI 40.24 kg/m  CONSTITUTIONAL: Alert and oriented and responds appropriately to questions. Well-appearing; well-nourished HEAD: Normocephalic EYES: Conjunctivae clear, PERRL ENT: normal nose; no rhinorrhea; moist mucous membranes NECK: Supple, no meningismus, no LAD; no midline spinal tenderness or step-off or deformity CARD: RRR; S1 and S2 appreciated; no murmurs, no clicks, no rubs, no gallops RESP: Normal chest excursion without splinting or tachypnea; breath sounds clear and equal bilaterally; no wheezes, no rhonchi, no rales, no hypoxia or respiratory distress, speaking full sentences ABD/GI: Normal bowel sounds; non-distended; soft, non-tender, no rebound, no guarding, no  peritoneal signs BACK:  The back appears normal and is non-tender to palpation, there is no CVA tenderness, no midline spinal tenderness or step-off or deformity EXT: Normal ROM in all joints; non-tender to palpation; no edema; normal capillary refill; no cyanosis, no calf tenderness or swelling    SKIN: Normal color for age and race; warm; no rash NEURO: Moves all extremities equally, sensation to light touch intact diffusely, cranial nerves II through XII intact PSYCH: The patient's mood and manner are appropriate. Grooming and personal hygiene are  appropriate.  MEDICAL DECISION MAKING: Patient here after her typical seizure. Has history of difficulty remembering taking her Keppra. Likely the cause of her seizure tonight. Was last here in September for the same. No sign of trauma on exam. At her neurologic baseline. Labs, pregnancy test pending. Will load patient with IV Keppra.  ED PROGRESS: Patient's labs are unremarkable.  No further seizure-like activity. Has been given IV Keppra.  I feel patient is safe to be discharged home with follow-up with her neurologist. Discussed return precautions. She verbalized understanding and is comfortable with this plan.   CMP shows sodium 134, potassium 3.8, chloride 107, bicarbonate 23, glucose 91, BUN 14, creatinine 0.85, calcium 8.6, total protein 7.2, albumin 3.8, AST 19, ALT 17, alkaline phosphatase 79, total bilirubin 1.2.    At this time, I do not feel there is any life-threatening condition present. I have reviewed and discussed all results (EKG, imaging, lab, urine as appropriate), exam findings with patient/family. I have reviewed nursing notes and appropriate previous records.  I feel the patient is safe to be discharged home without further emergent workup and can continue workup as an outpatient as needed. Discussed usual and customary return precautions. Patient/family verbalize understanding and are comfortable with this plan.  Outpatient follow-up has been provided. All questions have been answered.     Layla MawKristen N Ward, DO 01/05/16 [phone]

## 2016-01-05 NOTE — ED Notes (Signed)
Pt friend at bedside and pt asks how long meds will take.

## 2016-01-05 NOTE — ED Notes (Signed)
CBG- 77 

## 2016-01-05 NOTE — ED Triage Notes (Signed)
Pt had a seizure at home witnessed by her children.  Pt takes Keppra, but states she sometimes forgets to take it.

## 2016-01-16 ENCOUNTER — Ambulatory Visit: Payer: Medicaid Other | Admitting: Advanced Practice Midwife

## 2016-08-28 IMAGING — MR MR HEAD WO/W CM
6 of 14 series · 17 of 48 positions shown · IV contrast (20ml Multihance)
Comparison: CT head 08/11/2015

CLINICAL DATA: Seizure.

EXAM:
MRI HEAD WITHOUT AND WITH CONTRAST
TECHNIQUE: Multiplanar, multiecho pulse sequences of the brain and surrounding
structures were obtained without and with intravenous contrast.
CONTRAST:  20mL MULTIHANCE GADOBENATE DIMEGLUMINE 529 MG/ML IV SOLN

[Series 5: T2 · axial · 5.0mm · 0.48mm/px · z∈[-65,+77]mm · 2 of 23 slices shown (1 of 2)]
[im 1/23]
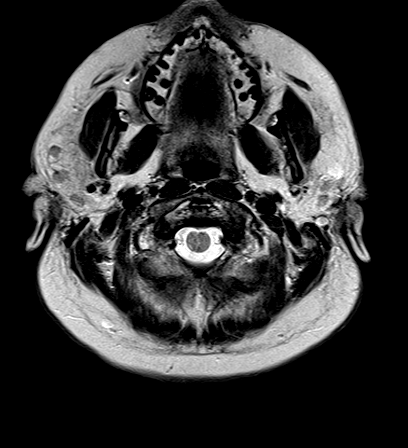
[im 23/23]
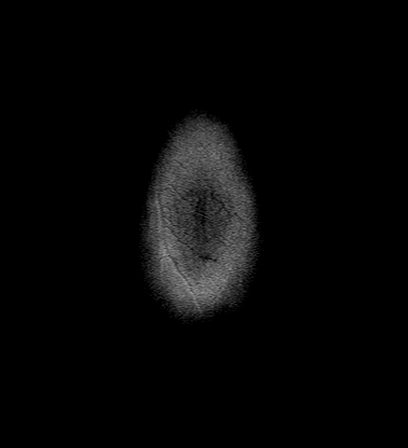

[Series 6: FLAIR · axial · 5.0mm · 0.33mm/px · z∈[-66,+76]mm · 2 of 23 slices shown]
[im 1/23]
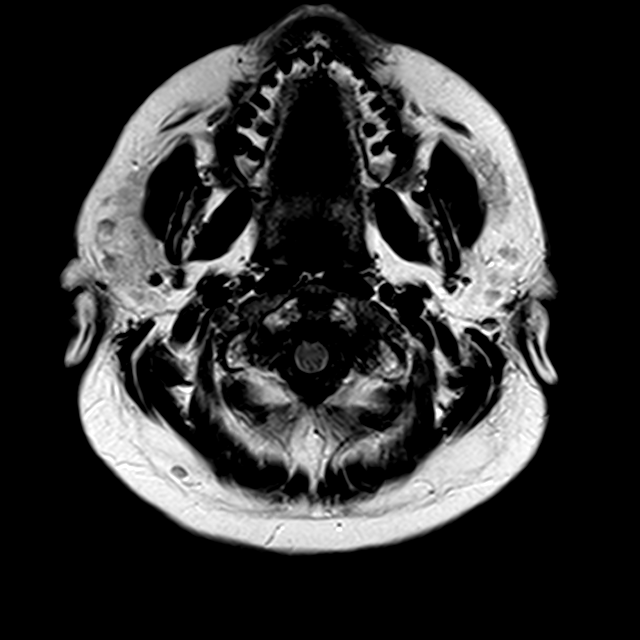
[im 23/23]
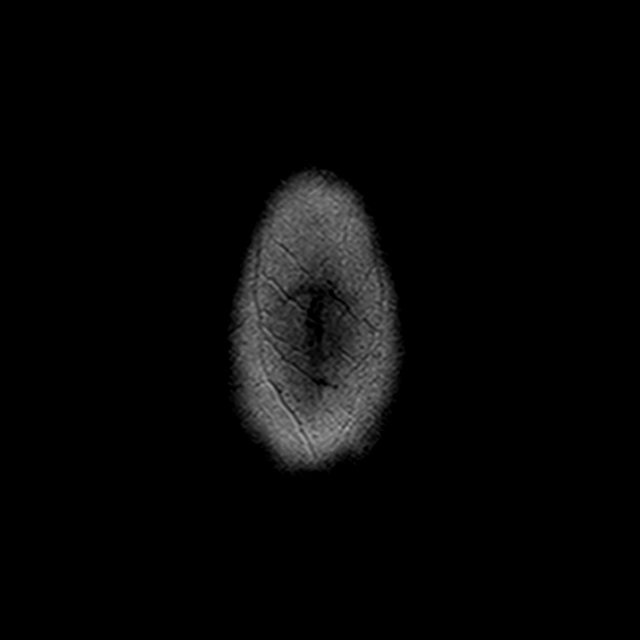

[Series 9: T2 · coronal · 3.0mm · 0.20mm/px · 2 of 38 slices shown (2 of 2)]
[im 1/38]
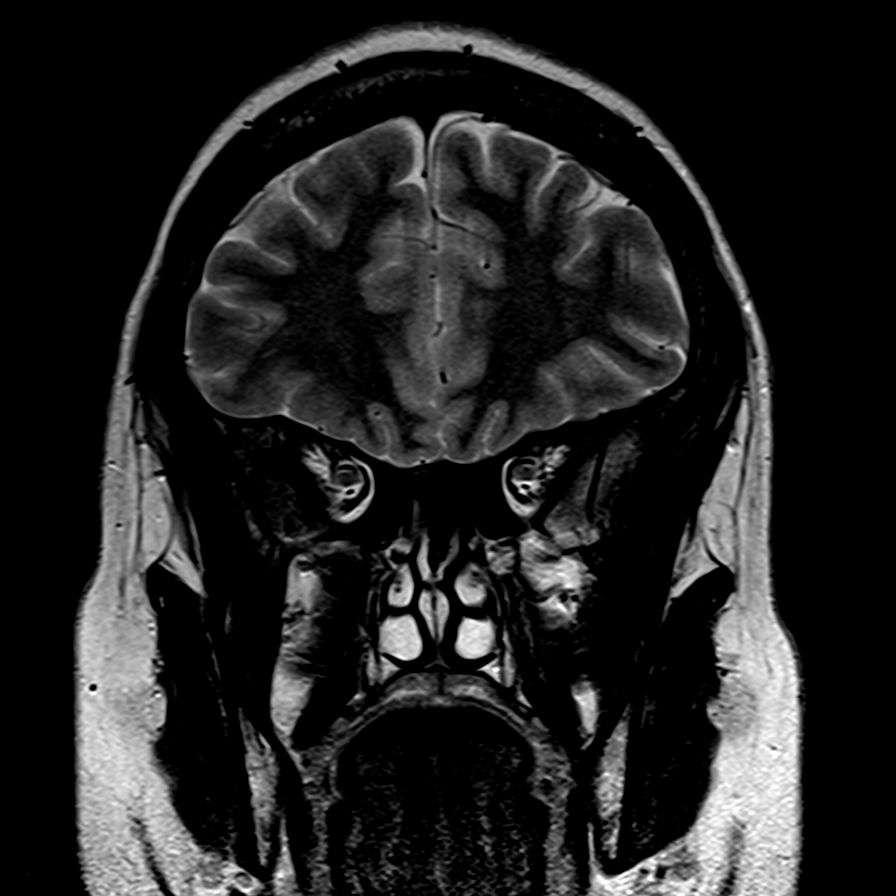
[im 19/38]
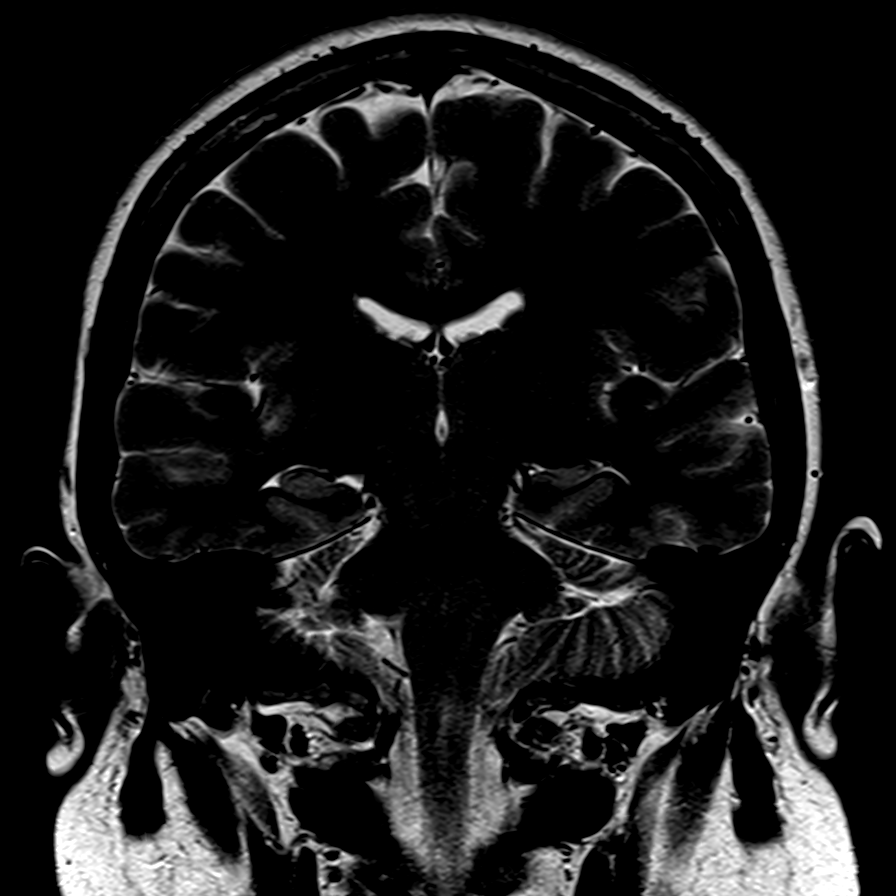

[Series 11: T1 post-contrast · axial · 2.0mm · 0.42mm/px · z∈[-75,+77]mm · 7 of 78 slices shown (1 of 3)]
[im 1/78]
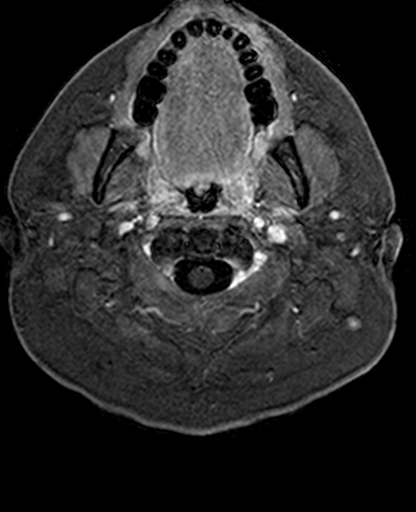
[im 13/78]
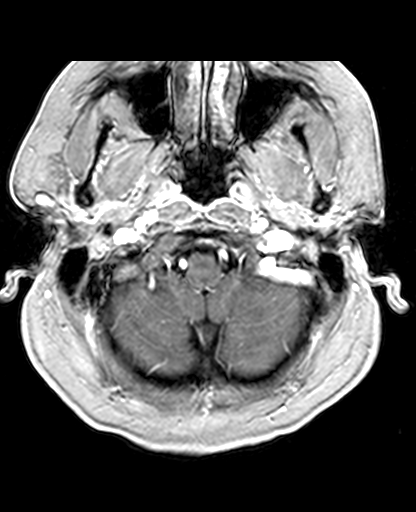
[im 26/78]
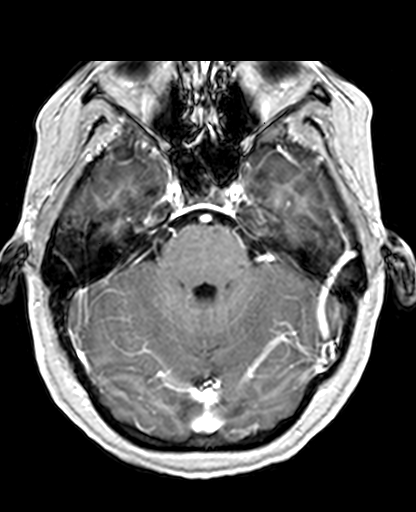
[im 39/78]
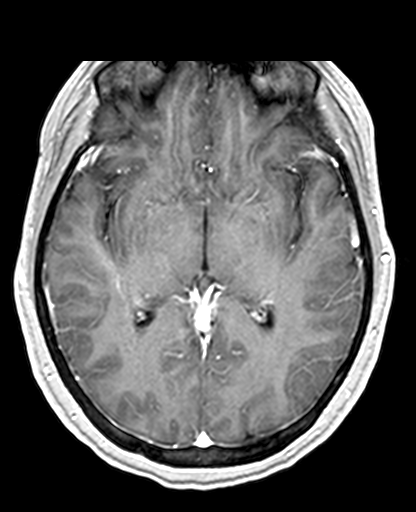
[im 52/78]
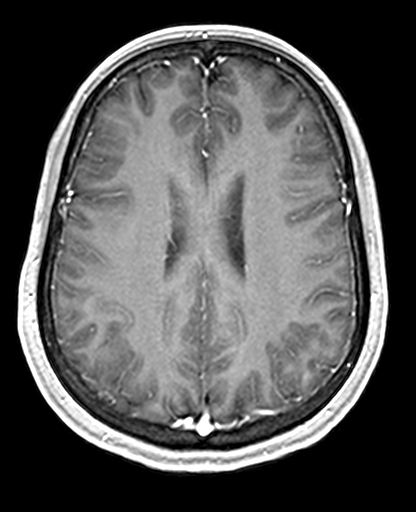
[im 65/78]
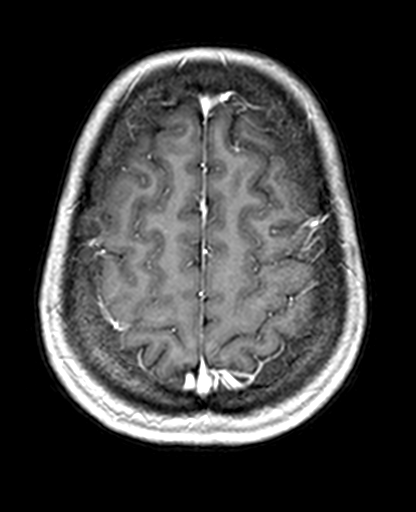
[im 78/78]
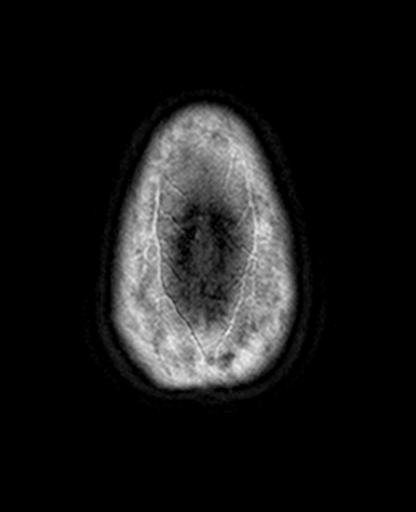

[Series 12: T1 post-contrast · coronal · 5.0mm · 0.38mm/px · 2 of 28 slices shown (2 of 3)]
[im 1/28]
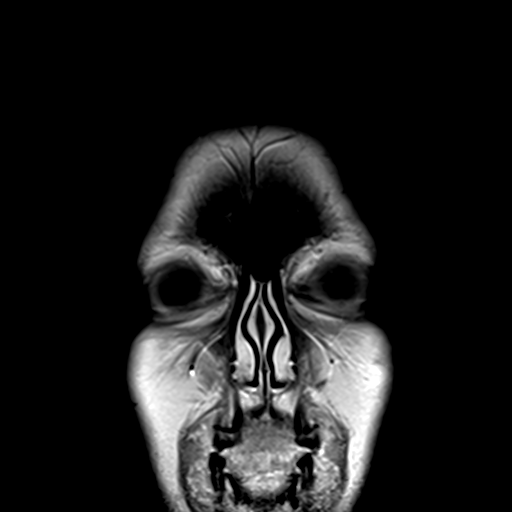
[im 28/28]
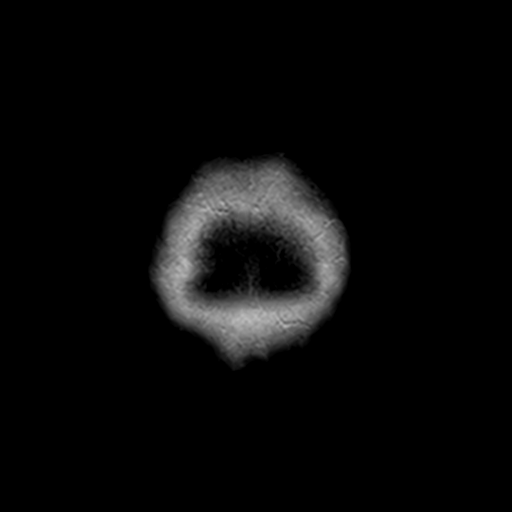

[Series 13: T1 post-contrast · sagittal · 5.0mm · 0.41mm/px · 2 of 20 slices shown (3 of 3)]
[im 1/20]
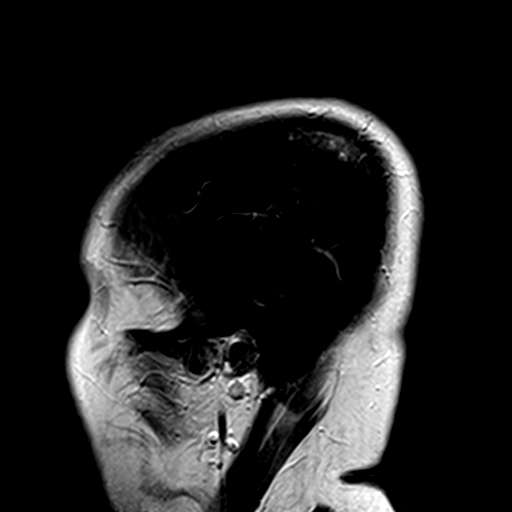
[im 20/20]
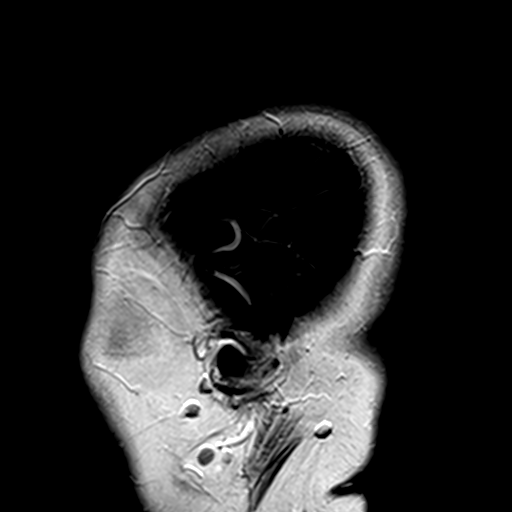

[17 of 48 positions shown; findings below may reference images not displayed]

FINDINGS: Ventricle size normal. Cerebral volume normal. No areas of
encephalomalacia.

Negative for acute or chronic infarction

Negative for demyelinating disease. Normal white matter. Normal
brainstem and cerebellum.

Negative for hemorrhage or fluid collection.

Negative for mass or edema.  No shift of the midline structures.

Postcontrast imaging demonstrates no enhancing mass lesion. Small
developmental venous anomaly right posterior temporal lobe

Medial temporal lobe normal in signal and volume. No evidence of
mesial temporal sclerosis.

Mild mucosal edema in the sphenoid sinus.  Remaining sinuses clear.

Circle of Willis patent.  Normal orbit.
IMPRESSION: Small developmental venous anomaly right posterior temporal lobe.
This is probably an incidental finding. Otherwise normal MRI of the
brain with contrast.

## 2016-08-30 ENCOUNTER — Encounter (HOSPITAL_COMMUNITY): Payer: Self-pay | Admitting: Emergency Medicine

## 2016-08-30 ENCOUNTER — Emergency Department (HOSPITAL_COMMUNITY)
Admission: EM | Admit: 2016-08-30 | Discharge: 2016-08-30 | Disposition: A | Discharge status: Home / Self Care | Payer: Medicaid Other | Attending: Emergency Medicine | Admitting: Emergency Medicine

## 2016-08-30 DIAGNOSIS — R569 Unspecified convulsions: Secondary | ICD-10-CM

## 2016-08-30 DIAGNOSIS — G40909 Epilepsy, unspecified, not intractable, without status epilepticus: Secondary | ICD-10-CM | POA: Diagnosis not present

## 2016-08-30 LAB — CBG MONITORING, ED: Glucose-Capillary: 87 mg/dL (ref 65–99)

## 2016-08-30 NOTE — ED Provider Notes (Signed)
[facility]-EMERGENCY DEPT Provider Note   CSN: [id number] Arrival date & time: 08/30/16  0725     History   Chief Complaint Chief Complaint  Patient presents with  . Seizures    HPI Alexandra Garrett is a 31 y.o. female.  HPI   Alexandra Garrett is a 31 y.o. female with hx of seizures, who presents to the Emergency Department by EMS for a seizure that occurred on several hours prior to arrival. Complains of frontal headache.  States that she missed her evening Keppra dose due to be excited that her son came home from camp.  She denies recent illness or injury, numbness or weakness.  Seizure witnessed by patient's son.  He states episode lasted "few minutes" she denies dizziness, numbness, visual changes, and incontinence.    Past Medical History:  Diagnosis Date  . Anemia   . Seizures [facility](HCC)     Patient Active Problem List   Diagnosis Date Noted  . Encounter for IUD insertion 12/19/2015  . Seizures (HCC)     History reviewed. No pertinent surgical history.  OB History    Gravida Para Term Preterm AB Living             2   SAB TAB Ectopic Multiple Live Births                   Home Medications    Prior to Admission medications   Medication Sig Start Date End Date Taking? Authorizing Provider  ibuprofen (ADVIL,MOTRIN) 200 MG tablet Take 400 mg by mouth every 6 (six) hours as needed for moderate pain.    [provider]  levETIRAcetam (KEPPRA) 500 MG tablet Take 1 tablet (500 mg total) by mouth 2 (two) times daily. 01/05/16   Ward, Layla MawKristen N, DO  levonorgestrel (MIRENA) 20 MCG/24HR IUD 1 each by Intrauterine route once.    [provider]  naproxen sodium (ANAPROX) 220 MG tablet Take 220 mg by mouth 2 (two) times daily with a meal.    [provider]  solifenacin (VESICARE) 10 MG tablet Take 1 tablet (10 mg total) by mouth daily. 08/09/15   Lazaro ArmsEure, Luther H, MD    Family History Family History  Problem Relation Age of Onset  . Arthritis Mother     . Cancer Mother        lymph nodes  . Depression Mother   . Hypertension Mother   . Heart disease Father   . Arthritis Maternal Grandmother   . Cancer Maternal Grandmother        breast  . Diabetes Maternal Grandmother   . Cancer Maternal Grandfather        lung  . Stroke Paternal Grandmother   . Heart disease Paternal Grandfather   . Cancer Paternal Aunt        breast    Social History Social History  Substance Use Topics  . Smoking status: Never Smoker  . Smokeless tobacco: Never Used  . Alcohol use No     Allergies   Patient has no known allergies.   Review of Systems Review of Systems  Constitutional: Negative for activity change, appetite change and fever.  HENT: Negative for facial swelling and trouble swallowing.   Eyes: Negative for photophobia, pain and visual disturbance.  Respiratory: Negative for chest tightness and shortness of breath.   Cardiovascular: Negative for chest pain.  Gastrointestinal: Negative for abdominal pain, nausea and vomiting.  Genitourinary: Negative for dysuria.  Musculoskeletal: Negative for neck pain  and neck stiffness.  Skin: Negative for rash and wound.  Neurological: Positive for seizures and headaches. Negative for dizziness, facial asymmetry, speech difficulty, weakness and numbness.  Psychiatric/Behavioral: Negative for decreased concentration. The patient is not nervous/anxious.   All other systems reviewed and are negative.    Physical Exam Updated Vital Signs BP 119/83 (BP Location: Left Arm)   Pulse 84   Temp 98.3 F (36.8 C) (Oral)   Resp 10   Ht 5\' 2"  (1.575 m)   Wt 97.5 kg (215 lb)   SpO2 97%   BMI 39.32 kg/m   Physical Exam  Constitutional: She is oriented to person, place, and time. She appears well-developed and well-nourished. No distress.  HENT:  Head: Atraumatic.  Mouth/Throat: Oropharynx is clear and moist.  Eyes: Conjunctivae and EOM are normal. Pupils are equal, round, and reactive to light.   Neck: Normal range of motion, full passive range of motion without pain and phonation normal.  Cardiovascular: Normal rate, regular rhythm and intact distal pulses.   No murmur heard. Pulmonary/Chest: Effort normal and breath sounds normal. No respiratory distress.  Abdominal: Soft. She exhibits no distension. There is no tenderness. There is no guarding.  Musculoskeletal: Normal range of motion. She exhibits no edema or tenderness.  Lymphadenopathy:    She has no cervical adenopathy.  Neurological: She is alert and oriented to person, place, and time. No sensory deficit.  Skin: Skin is warm. Capillary refill takes less than 2 seconds. No rash noted.  Psychiatric: She has a normal mood and affect.  Nursing note and vitals reviewed.    ED Treatments / Results  Labs (all labs ordered are listed, but only abnormal results are displayed) Labs Reviewed  CBG MONITORING, ED    EKG  EKG Interpretation None       Radiology No results found.  Procedures Procedures (including critical care time)  Medications Ordered in ED Medications - No data to display   Initial Impression / Assessment and Plan / ED Course  I have reviewed the triage vital signs and the nursing notes.  Pertinent labs & imaging results that were available during my care of the patient were reviewed by me and considered in my medical decision making (see chart for details).     Pt is well appearing.  Vitals stable.  No focal neuro deficits.  States she is feeling better and requesting discharge.  Has hx of seizures and treated with keppra.  Missed evening dose.  She appears stable for d//c, return precautions discussed.  Final Clinical Impressions(s) / ED Diagnoses   Final diagnoses:  Seizure [facility])    New Prescriptions New Prescriptions   No medications on file     Pauline Aus, Cordelia Poche 09/01/16 2131    Vanetta Mulders, MD 09/07/16 [phone]

## 2016-08-30 NOTE — ED Notes (Signed)
Pt made aware to return if symptoms worsen or if any life threatening symptoms occur.  Tammy okay with d/c bp 106/64.

## 2016-08-30 NOTE — ED Triage Notes (Signed)
PT had a witnessed seizure this am by her son and upon EMS arrival pt was disoriented and oriented to person only. PT is on Keppra 1000mg  nightly. PT only complaint is a headache this am and is now oriented to person, and place but not to time.

## 2016-08-30 NOTE — ED Notes (Signed)
Pt ambulated well with no assistance. No complaints while walking.

## 2016-08-30 NOTE — Discharge Instructions (Signed)
Its important to take your Keppra at same time every day and do not miss doses.  Call your neurologist to arrange a follow-up appt.

## 2016-12-25 ENCOUNTER — Encounter (HOSPITAL_COMMUNITY): Payer: Self-pay | Admitting: Emergency Medicine

## 2016-12-25 ENCOUNTER — Emergency Department (HOSPITAL_COMMUNITY)
Admission: EM | Admit: 2016-12-25 | Discharge: 2016-12-25 | Disposition: A | Discharge status: Home / Self Care | Payer: Medicaid Other | Attending: Emergency Medicine | Admitting: Emergency Medicine

## 2016-12-25 DIAGNOSIS — R569 Unspecified convulsions: Secondary | ICD-10-CM | POA: Insufficient documentation

## 2016-12-25 DIAGNOSIS — Z79899 Other long term (current) drug therapy: Secondary | ICD-10-CM | POA: Insufficient documentation

## 2016-12-25 HISTORY — DX: Patient's other noncompliance with medication regimen: Z91.14

## 2016-12-25 HISTORY — DX: Patient's other noncompliance with medication regimen for other reason: Z91.148

## 2016-12-25 LAB — CBG MONITORING, ED: Glucose-Capillary: 118 mg/dL — ABNORMAL HIGH (ref 65–99)

## 2016-12-25 MED ORDER — ACETAMINOPHEN 500 MG PO TABS
1000.0000 mg | ORAL_TABLET | Freq: Once | ORAL | Status: AC
  Administered 2016-12-25: 1000 mg via ORAL
  Filled 2016-12-25: qty 2

## 2016-12-25 MED ORDER — KETOROLAC TROMETHAMINE 60 MG/2ML IM SOLN
60.0000 mg | Freq: Once | INTRAMUSCULAR | Status: AC
  Administered 2016-12-25: 60 mg via INTRAMUSCULAR
  Filled 2016-12-25: qty 2

## 2016-12-25 NOTE — ED Notes (Signed)
Pt refuses EKG. EDP notified.

## 2016-12-25 NOTE — ED Triage Notes (Signed)
Pt brought in by [facility] with c/o seizure at home today. When EMS arrived pt was laying on the couch and post ictal. Pt was alert with some confusion when EMS arrived. Pt c/o nausea and headache. Pt has hx of seizures and on Keppra. Last seizure 1 month ago.

## 2016-12-25 NOTE — ED Provider Notes (Signed)
Emergency Department Provider Note   I have reviewed the triage vital signs and the nursing notes.   HISTORY  Chief Complaint Seizures   HPI Alexandra Garrett is a 31 y.o. female with a reported history of seizures however not ever seen on EEG the presents emergency Department with seizure like activity.patient states that she hasn't been noncompliant with her medication of the last few days, Keppra, and today she laid down to take a nap which is usually when her seizures happened.Her son states that for approximately 1.5-2 minutes she had full body shaking activity that he called EMS on EMS arrival. Patient is very sleepy and confused with a headache. That is all resolved to the point now where all she has is a slight head pain that she has after seizures in the past. I don't know if the glucose was checked. She is asymptomatic otherwise. She has no chest pain, back pain, abdominal pain, leg pain, arm pain, confusion, recent illnesses, fever or other associated symptoms. No modifying symptoms. Patient states that she takes her medications she does well. Her neurologist is Dr. Gerilyn Pilgrimoonquah.   Past Medical History:  Diagnosis Date  . Anemia   . History of medication noncompliance   . Seizures [facility]HCC)     Patient Active Problem List   Diagnosis Date Noted  . Encounter for IUD insertion 12/19/2015  . Seizures (HCC)     History reviewed. No pertinent surgical history.  Current Outpatient Rx  . Order #: [id number] Class: Historical Med  . Order #: [id number] Class: Print  . Order #: [id number] Class: Historical Med  . Order #: [id number] Class: Historical Med  . Order #: [id number] Class: Normal    Allergies Patient has no known allergies.  Family History  Problem Relation Age of Onset  . Arthritis Mother   . Cancer Mother        lymph nodes  . Depression Mother   . Hypertension Mother   . Heart disease Father   . Arthritis Maternal Grandmother   . Cancer Maternal Grandmother    breast  . Diabetes Maternal Grandmother   . Cancer Maternal Grandfather        lung  . Stroke Paternal Grandmother   . Heart disease Paternal Grandfather   . Cancer Paternal Aunt        breast    Social History Social History  Substance Use Topics  . Smoking status: Never Smoker  . Smokeless tobacco: Never Used  . Alcohol use No    Review of Systems  All other systems negative except as documented in the HPI. All pertinent positives and negatives as reviewed in the HPI. ____________________________________________   PHYSICAL EXAM:  VITAL SIGNS: ED Triage Vitals  Enc Vitals Group     BP 12/25/16 1545 104/77     Pulse Rate 12/25/16 1548 80     Resp --      Temp 12/25/16 1548 97.7 F (36.5 C)     Temp Source 12/25/16 1548 Oral     SpO2 12/25/16 1548 97 %     Weight 12/25/16 1549 215 lb (97.5 kg)     Height 12/25/16 1549 5\' 5"  (1.651 m)     Head Circumference --      Peak Flow --      Pain Score --      Pain Loc --      Pain Edu? --      Excl. in GC? --     Constitutional: Alert and oriented. Well  appearing and in no acute distress. Eyes: Conjunctivae are normal. PERRL. EOMI. Head: Atraumatic. Nose: No congestion/rhinnorhea. Mouth/Throat: Mucous membranes are moist.  Oropharynx non-erythematous. Neck: No stridor.  No meningeal signs.   Cardiovascular: Normal rate, regular rhythm. Good peripheral circulation. Grossly normal heart sounds.   Respiratory: Normal respiratory effort.  No retractions. Lungs CTAB. Gastrointestinal: Soft and nontender. No distention.  Musculoskeletal: No lower extremity tenderness nor edema. No gross deformities of extremities. Neurologic:  Normal speech and language. No gross focal neurologic deficits are appreciated. No altered mental status, able to give full seemingly accurate history.  Face is symmetric, EOM's intact, pupils equal and reactive, vision intact, tongue and uvula midline without deviation. Upper and Lower extremity motor  5/5, intact pain perception in distal extremities, 2+ reflexes in biceps, patella and achilles tendons. Able to perform finger to nose normal with both hands.  Skin:  Skin is warm, dry and intact. No rash noted.   ____________________________________________   LABS (all labs ordered are listed, but only abnormal results are displayed)  Labs Reviewed  CBG MONITORING, ED - Abnormal; Notable for the following:       Result Value   Glucose-Capillary 118 (*)    All other components within normal limits   ____________________________________________  EKG  refused ____________________________________________  RADIOLOGY  No results found.  ____________________________________________   PROCEDURES  Procedure(s) performed:   Procedures   ____________________________________________   INITIAL IMPRESSION / ASSESSMENT AND PLAN / ED COURSE  Pertinent labs & imaging results that were available during my care of the patient were reviewed by me and considered in my medical decision making (see chart for details).  Likely breakthrough seizure secondary noncompliance of medications. We will check a CBG, EKG and treat her symptoms otherwise no indication for further workup. She'll follow-up with neurology and also start taking medications appropriately.  cbg ok. At baseline and refusing ECG. Dc in stable condition   ____________________________________________  FINAL CLINICAL IMPRESSION(S) / ED DIAGNOSES  Final diagnoses:  Seizure (HCC)     MEDICATIONS GIVEN DURING THIS VISIT:  Medications  ketorolac (TORADOL) injection 60 mg (60 mg Intramuscular Given 12/25/16 1638)  acetaminophen (TYLENOL) tablet 1,000 mg (1,000 mg Oral Given 12/25/16 1638)     NEW OUTPATIENT MEDICATIONS STARTED DURING THIS VISIT:  Discharge Medication List as of 12/25/2016  4:31 PM      Note:  This document was prepared using Dragon voice recognition software and may include unintentional  dictation errors.   Marily Memos, MD 12/25/16 [phone]

## 2017-01-15 ENCOUNTER — Emergency Department (HOSPITAL_COMMUNITY)
Admission: EM | Admit: 2017-01-15 | Discharge: 2017-01-15 | Disposition: A | Discharge status: Home / Self Care | Payer: Medicaid Other | Attending: Emergency Medicine | Admitting: Emergency Medicine

## 2017-01-15 ENCOUNTER — Encounter (HOSPITAL_COMMUNITY): Payer: Self-pay | Admitting: *Deleted

## 2017-01-15 ENCOUNTER — Other Ambulatory Visit: Payer: Self-pay

## 2017-01-15 DIAGNOSIS — H9201 Otalgia, right ear: Secondary | ICD-10-CM | POA: Insufficient documentation

## 2017-01-15 DIAGNOSIS — Z79899 Other long term (current) drug therapy: Secondary | ICD-10-CM | POA: Diagnosis not present

## 2017-01-15 MED ORDER — ANTIPYRINE-BENZOCAINE 5.4-1.4 % OT SOLN: 3.0000 [drp] | OTIC | 0 refills | Status: DC | PRN

## 2017-01-15 MED ORDER — DICLOFENAC SODIUM 75 MG PO TBEC: 75.0000 mg | DELAYED_RELEASE_TABLET | Freq: Two times a day (BID) | ORAL | 0 refills | Status: DC

## 2017-01-15 NOTE — ED Triage Notes (Signed)
Pt c/o right ear pain that started yesterday that radiates down into right jaw. Denies drainage, fever.

## 2017-01-15 NOTE — Discharge Instructions (Signed)
Use the ear drops as directed.  Follow-up with your primary doctor or dentist for recheck.  Return here for any worsening symptoms

## 2017-01-17 NOTE — ED Provider Notes (Signed)
[facility] EMERGENCY DEPARTMENT Provider Note   CSN: [id number] Arrival date & time: 01/15/17  1520     History   Chief Complaint Chief Complaint  Patient presents with  . Otalgia    HPI Alexandra Garrett is a 31 y.o. female.  HPI  Alexandra Garrett is a 31 y.o. female who presents to the Emergency Department complaining of ear pain that began one day prior to arrival.  She describes a sharp, stabbing pain to her ear that radiates in the right side of her face and along her jawline.  Pain is worsened with opening and closing her mouth and chewing.  Denies fever, neck pain or stiffness, chest pain, arm pain, nausea or vomiting.  No congestion.    Past Medical History:  Diagnosis Date  . Anemia   . History of medication noncompliance   . Seizures [facility](HCC)     Patient Active Problem List   Diagnosis Date Noted  . Encounter for IUD insertion 12/19/2015  . Seizures (HCC)     History reviewed. No pertinent surgical history.  OB History    Gravida Para Term Preterm AB Living             2   SAB TAB Ectopic Multiple Live Births                   Home Medications    Prior to Admission medications   Medication Sig Start Date End Date Taking? Authorizing Provider  antipyrine-benzocaine Lyla Son(AURALGAN) OTIC solution Place 3-4 drops every 2 (two) hours as needed into the right ear for ear pain. 01/15/17   Tanner Yeley, PA-C  diclofenac (VOLTAREN) 75 MG EC tablet Take 1 tablet (75 mg total) 2 (two) times daily by mouth. Take with food 01/15/17   Cerra Eisenhower, PA-C  ibuprofen (ADVIL,MOTRIN) 200 MG tablet Take 400 mg by mouth every 6 (six) hours as needed for moderate pain.    [provider]  levETIRAcetam (KEPPRA) 500 MG tablet Take 1 tablet (500 mg total) by mouth 2 (two) times daily. 01/05/16   Ward, Layla MawKristen N, DO  levonorgestrel (MIRENA) 20 MCG/24HR IUD 1 each by Intrauterine route once.    [provider]  naproxen sodium (ANAPROX) 220 MG tablet Take 220 mg by  mouth 2 (two) times daily with a meal.    [provider]  solifenacin (VESICARE) 10 MG tablet Take 1 tablet (10 mg total) by mouth daily. 08/09/15   Lazaro ArmsEure, Luther H, MD    Family History Family History  Problem Relation Age of Onset  . Arthritis Mother   . Cancer Mother        lymph nodes  . Depression Mother   . Hypertension Mother   . Heart disease Father   . Arthritis Maternal Grandmother   . Cancer Maternal Grandmother        breast  . Diabetes Maternal Grandmother   . Cancer Maternal Grandfather        lung  . Stroke Paternal Grandmother   . Heart disease Paternal Grandfather   . Cancer Paternal Aunt        breast    Social History Social History   Tobacco Use  . Smoking status: Never Smoker  . Smokeless tobacco: Never Used  Substance Use Topics  . Alcohol use: No    Alcohol/week: 0.0 oz  . Drug use: No     Allergies   Patient has no known allergies.   Review of Systems Review  of Systems  Constitutional: Negative for appetite change, chills and fever.  HENT: Positive for ear pain. Negative for congestion, dental problem, ear discharge, facial swelling, sore throat and trouble swallowing.   Respiratory: Negative for cough, chest tightness, shortness of breath and wheezing.   Cardiovascular: Negative for chest pain.  Gastrointestinal: Negative for abdominal pain, nausea and vomiting.  Musculoskeletal: Negative for arthralgias.  Skin: Negative for rash.  Neurological: Negative for dizziness, speech difficulty, weakness, numbness and headaches.  Hematological: Negative for adenopathy.  All other systems reviewed and are negative.    Physical Exam Updated Vital Signs BP 111/72   Pulse 64   Temp 97.9 F (36.6 C) (Oral)   Resp 18   Ht 5\' 2"  (1.575 m)   Wt 97.5 kg (215 lb)   SpO2 100%   BMI 39.32 kg/m   Physical Exam  Constitutional: She is oriented to person, place, and time. She appears well-developed and well-nourished. No distress.    HENT:  Head: Atraumatic.  Right Ear: Ear canal normal. No drainage, swelling or tenderness. No mastoid tenderness. Tympanic membrane is not erythematous. A middle ear effusion is present. No decreased hearing is noted.  Left Ear: Tympanic membrane and ear canal normal.  Mouth/Throat: Uvula is midline, oropharynx is clear and moist and mucous membranes are normal. No trismus in the jaw. No dental caries. No posterior oropharyngeal edema or posterior oropharyngeal erythema.  ttp of right TMJ. No click on exam  Cardiovascular: Normal rate, regular rhythm and normal heart sounds.  No murmur heard. Pulmonary/Chest: Effort normal and breath sounds normal. No respiratory distress.  Musculoskeletal: Normal range of motion.  Neurological: She is alert and oriented to person, place, and time. No sensory deficit.  Skin: Skin is warm. Capillary refill takes less than 2 seconds. No rash noted.  Nursing note and vitals reviewed.    ED Treatments / Results  Labs (all labs ordered are listed, but only abnormal results are displayed) Labs Reviewed - No data to display  EKG  EKG Interpretation None       Radiology No results found.  Procedures Procedures (including critical care time)  Medications Ordered in ED Medications - No data to display   Initial Impression / Assessment and Plan / ED Course  I have reviewed the triage vital signs and the nursing notes.  Pertinent labs & imaging results that were available during my care of the patient were reviewed by me and considered in my medical decision making (see chart for details).     Pt well appearing.  No mastoid tenderness.  Mild middle ear effusion w/o obvious OM.  ttp at the right TMJ joint.   Final Clinical Impressions(s) / ED Diagnoses   Final diagnoses:  Otalgia of right ear    ED Discharge Orders        Ordered    antipyrine-benzocaine (AURALGAN) OTIC solution  Every 2 hours PRN     01/15/17 1621    diclofenac  (VOLTAREN) 75 MG EC tablet  2 times daily     01/15/17 1621       Pauline Ausriplett, Zanyla Klebba, PA-C 01/17/17 2047    Raeford RazorKohut, Stephen, MD 01/20/17 1234

## 2017-02-12 ENCOUNTER — Encounter: Payer: Self-pay | Admitting: Family Medicine

## 2017-02-12 ENCOUNTER — Ambulatory Visit: Payer: Medicaid Other | Admitting: Family Medicine

## 2017-02-12 ENCOUNTER — Other Ambulatory Visit: Payer: Self-pay

## 2017-02-12 VITALS — BP 118/72 | HR 67 | Temp 98.0°F | Resp 16 | Ht 62.0 in | Wt 245.2 lb

## 2017-02-12 DIAGNOSIS — G40909 Epilepsy, unspecified, not intractable, without status epilepticus: Secondary | ICD-10-CM | POA: Diagnosis not present

## 2017-02-12 NOTE — Progress Notes (Signed)
Patient ID: Alexandra Garrett, female    DOB: June 13, 1985, 31 y.o.   MRN: [id number]  Chief Complaint  Patient presents with  . Referral    [place]- patient is established with Dr. Gerilyn Garrett for management of epillepsy.   . Establish Care    Patient is former [facility] patient- insurance is requiring referral for neuro    Allergies Patient has no known allergies.  Subjective:   Alexandra Garrett is a 31 y.o. female who presents to [facility] today.  HPI [name] presents today as a new patient to establish care and get a referral to follow back up with her neurologist.  She has previously been followed by Dr. Gerilyn Garrett for epilepsy.  She is currently on Keppra 500 mg twice a day.  She reports that she has not always been compliant with taking her medications but as of late she has been compliant with them.  She reports she has never had seizures during the day or while driving that her seizures have always occurred at night.  Has been seen and evaluated by him and has had EEGs performed.  I do not have access to these records or test today.  She reports she has refills on her medication until the referral is able to be processed through [facility].  She is followed by Dr. Emelda Garrett for her gynecologic care.  She recently had an IUD placed.  Denies any pain or problems.  Reports she has been told in the past that she has arthritis in her knees and that it was related to her weight.  Has never had a diagnosis of diabetes or hypertension.  Reports that her mood is good.  Denies any mood problems or side effects on the Keppra.  Has 2 children from a previous marriage.  Emergency department notes from June 2018 and October 2018 reviewed.  Visits were for seizures secondary to medical noncompliance with medication.    Past Medical History:  Diagnosis Date  . Anemia   . History of medication noncompliance   . Seizures (HCC)     History reviewed. No pertinent surgical  history.  Family History  Problem Relation Age of Onset  . Arthritis Mother   . Depression Mother   . Hypertension Mother   . Breast cancer Mother        lymph nodes  . Heart disease Father   . Arthritis Maternal Grandmother   . Cancer Maternal Grandmother        breast  . Diabetes Maternal Grandmother   . Cancer Maternal Grandfather        lung  . Stroke Paternal Grandmother   . Heart disease Paternal Grandfather      Social History   Socioeconomic History  . Marital status: Divorced    Spouse name: None  . Number of children: None  . Years of education: None  . Highest education level: None  Social Needs  . Financial resource strain: None  . Food insecurity - worry: None  . Food insecurity - inability: None  . Transportation needs - medical: None  . Transportation needs - non-medical: None  Occupational History  . None  Tobacco Use  . Smoking status: Never Smoker  . Smokeless tobacco: Never Used  Substance and Sexual Activity  . Alcohol use: No    Alcohol/week: 0.0 oz  . Drug use: No  . Sexual activity: Not Currently    Birth control/protection: IUD  Other Topics Concern  . 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   Has two children, 2 boys   Divorced.    Eats all food groups.   Wears seatbelt.    Attends church.    Current Outpatient Medications on File Prior to Visit  Medication Sig Dispense Refill  . ibuprofen (ADVIL,MOTRIN) 200 MG tablet Take 400 mg by mouth every 6 (six) hours as needed for moderate pain.    Marland Kitchen. levETIRAcetam (KEPPRA) 500 MG tablet Take 1 tablet (500 mg total) by mouth 2 (two) times daily. 60 tablet 3  . levonorgestrel (MIRENA) 20 MCG/24HR IUD 1 each by Intrauterine route once.    . naproxen sodium (ANAPROX) 220 MG tablet Take 220 mg by mouth 2 (two) times daily with a meal.    . antipyrine-benzocaine (AURALGAN) OTIC solution Place 3-4 drops every 2 (two) hours as needed into the right ear for ear pain. (Patient  not taking: Reported on 02/12/2017) 10 mL 0  . diclofenac (VOLTAREN) 75 MG EC tablet Take 1 tablet (75 mg total) 2 (two) times daily by mouth. Take with food (Patient not taking: Reported on 02/12/2017) 14 tablet 0  . solifenacin (VESICARE) 10 MG tablet Take 1 tablet (10 mg total) by mouth daily. (Patient not taking: Reported on 02/12/2017) 30 tablet 11   No current facility-administered medications on file prior to visit.     Review of Systems  Constitutional: Negative for activity change, appetite change, chills and diaphoresis.       Reports that she does have some fatigue that is associated with taking her anticonvulsants.  Reports that she has been anemic in the past but since getting IUD placed her periods are much lighter.  Respiratory: Negative for cough.   Cardiovascular: Negative for leg swelling.  Gastrointestinal: Negative for abdominal pain.  Musculoskeletal: Negative for myalgias.  Skin:       Patient reports that a few days ago her left foot was a little bit swollen and she does not recall any injury.  He reports that at this time the swelling is gone down.  She has no pain in her extremities other than some occasional knee pain.  Reports she has been told she has arthritis and it is associated with her obesity.  Neurological: Negative for dizziness, tremors, weakness and light-headedness.  Psychiatric/Behavioral: Negative for behavioral problems, confusion, dysphoric mood, sleep disturbance and suicidal ideas.     Objective:   BP 118/72 (BP Location: Left Arm, Patient Position: Sitting, Cuff Size: Normal)   Pulse 67   Temp 98 F (36.7 C) (Other (Comment))   Resp 16   Ht 5\' 2"  (1.575 m)   Wt 245 lb 4 oz (111.2 kg)   SpO2 99%   BMI 44.86 kg/m   Physical Exam  Constitutional: She is oriented to person, place, and time. She appears well-developed and well-nourished.  Pleasant obese female in no acute distress.  HENT:  Head: Normocephalic and atraumatic.  Eyes: EOM are  normal. Pupils are equal, round, and reactive to light.  Neck: Normal range of motion. Neck supple.  Cardiovascular: Normal rate, regular rhythm and normal heart sounds.  Pulmonary/Chest: Effort normal and breath sounds normal. No respiratory distress.  Musculoskeletal: Normal range of motion. She exhibits no edema.  Neurological: She is alert and oriented to person, place, and time. No cranial nerve deficit. Coordination normal.  Skin: Skin is warm and dry.  Psychiatric: She has a normal mood and affect. Her behavior is normal. Judgment and  thought content normal.     Assessment and Plan   1. Nonintractable epilepsy without status epilepticus, unspecified epilepsy type [facility](HCC) Patient is here basically to establish care and get a referral to follow back up with Dr. Gerilyn [place]ah.  Referral is placed today and will fax note sober.  She is already an established patient. -Compliance with her anticonvulsant medication was encouraged. - Ambulatory referral to Neurology Patient defers influenza vaccine today.  She reports she does not like shots her to get her blood drawn. I asked patient to schedule a complete physical examination for routine health maintenance.  She reports she will do this. Return in about 3 months (around 05/13/2017) for Complete physical exam.. Aliene Beamsachel Nickolis Diel, MD 02/12/2017

## 2017-03-19 ENCOUNTER — Encounter: Payer: Self-pay | Admitting: Family Medicine

## 2017-03-19 ENCOUNTER — Ambulatory Visit (INDEPENDENT_AMBULATORY_CARE_PROVIDER_SITE_OTHER): Payer: Medicaid Other | Admitting: Family Medicine

## 2017-03-19 ENCOUNTER — Other Ambulatory Visit: Payer: Self-pay

## 2017-03-19 VITALS — BP 116/74 | HR 63 | Temp 97.6°F | Resp 16 | Ht 62.0 in | Wt 246.0 lb

## 2017-03-19 DIAGNOSIS — H9193 Unspecified hearing loss, bilateral: Secondary | ICD-10-CM

## 2017-03-19 DIAGNOSIS — F419 Anxiety disorder, unspecified: Secondary | ICD-10-CM

## 2017-03-19 DIAGNOSIS — Z23 Encounter for immunization: Secondary | ICD-10-CM

## 2017-03-19 DIAGNOSIS — Z113 Encounter for screening for infections with a predominantly sexual mode of transmission: Secondary | ICD-10-CM

## 2017-03-19 DIAGNOSIS — L719 Rosacea, unspecified: Secondary | ICD-10-CM | POA: Diagnosis not present

## 2017-03-19 DIAGNOSIS — Z0000 Encounter for general adult medical examination without abnormal findings: Secondary | ICD-10-CM | POA: Diagnosis not present

## 2017-03-19 DIAGNOSIS — H539 Unspecified visual disturbance: Secondary | ICD-10-CM | POA: Diagnosis not present

## 2017-03-19 DIAGNOSIS — R5383 Other fatigue: Secondary | ICD-10-CM

## 2017-03-19 DIAGNOSIS — R739 Hyperglycemia, unspecified: Secondary | ICD-10-CM

## 2017-03-19 MED ORDER — METRONIDAZOLE 1 % EX GEL: Freq: Every day | CUTANEOUS | 0 refills | Status: DC

## 2017-03-19 MED ORDER — ESCITALOPRAM OXALATE 10 MG PO TABS: 10.0000 mg | ORAL_TABLET | Freq: Every day | ORAL | 0 refills | Status: DC

## 2017-03-19 NOTE — Progress Notes (Signed)
Patient ID: Alexandra Garrett, female    DOB: 07-04-1985, 32 y.o.   MRN: [id number]  Chief Complaint  Patient presents with  . Annual Exam    Allergies Patient has no known allergies.  Subjective:   Alexandra Garrett is a 32 y.o. female who presents to [facility] today.  HPI [name] presents today for her complete physical exam.  She receives her Pap and pelvic [facility]  She currently has an IUD in place for contraception.  In addition, she is followed by neurology for his seizure disorder.  She is seen by Dr. Gerilyn Pilgrim and has an upcoming appointment on April 16, 2017.  She reports that she is taking her Keppra as directed and has not been missing any doses.  She has been seen in the emergency department in the past for seizures due to noncompliance with her medication.  She reports she is here for her physical because she is motivated to make some improvements in her health status.  She reports that she has suffered from anxiety for years and would like a medication to help her.  She reports that she feels stressed, anxious, tense, and worries every single day.  She reports that her anxiety affects her quality of life and relationships.  Reports that her anxiety manifests as irritability with her children and significant other.  Denies feelings of depression or sadness.  Denies suicidal or homicidal ideations.  Reports was on Paxil for anxiety in the past but eventually just stopped the medication.  Reports that she has a bad habit of biting her fingernails when she is anxious.  She does not exercise and reports she does not eat a healthy diet.  Reports she believes she would feel better if she lost weight.   Anxiety  Presents for initial visit. Onset was more than 5 years ago. The problem has been gradually worsening. Symptoms include decreased concentration, excessive worry, irritability, nervous/anxious behavior and restlessness. Patient reports  no chest pain, compulsions, confusion, depressed mood, dizziness, dry mouth, feeling of choking, hyperventilation, insomnia, malaise, muscle tension, nausea, palpitations, shortness of breath or suicidal ideas. Symptoms occur most days. The symptoms are aggravated by family issues, social activities and work stress. The quality of sleep is good. Nighttime awakenings: occasional.   Risk factors include family history. The treatment provided mild relief. Compliance with prior treatments has been variable.    Past Medical History:  Diagnosis Date  . Anemia   . History of medication noncompliance   . Seizures (HCC)     History reviewed. No pertinent surgical history.  Family History  Problem Relation Age of Onset  . Arthritis Mother   . Depression Mother   . Hypertension Mother   . Breast cancer Mother        lymph nodes  . Heart disease Father   . Arthritis Maternal Grandmother   . Cancer Maternal Grandmother        breast  . Diabetes Maternal Grandmother   . Cancer Maternal Grandfather        lung  . Stroke Paternal Grandmother   . Heart disease Paternal Grandfather      Social History   Socioeconomic History  . Marital status: Divorced    Spouse name: None  . Number of children: None  . Years of education: None  . Highest education level: None  Social Needs  . Financial resource strain: None  . Food insecurity - worry: None  .  Food insecurity - inability: None  . Transportation needs - medical: None  . Transportation needs - non-medical: None  Occupational History  . None  Tobacco Use  . Smoking status: Never Smoker  . Smokeless tobacco: Never Used  Substance and Sexual Activity  . Alcohol use: No    Alcohol/week: 0.0 oz  . Drug use: No  . Sexual activity: Not Currently    Birth control/protection: IUD  Other Topics Concern  . 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   Has two children, 2 boys   Divorced.    Eats all food groups.    Wears seatbelt.    Attends church.     Review of Systems  Constitutional: Positive for fatigue and irritability. Negative for activity change, appetite change and fever.  HENT: Positive for hearing loss. Negative for postnasal drip, sore throat, trouble swallowing and voice change.        Reports that over the past couple years she has not been able to hear as well out of both ears.  Reports that her family and friends tell her that she cannot here and they often have to repeat things they say.  Denies any ringing in her ear and reports he used to attend large amount of concerts in her adolescence.  Eyes: Negative for photophobia and visual disturbance.       Reports that she needs to get her vision checked.  Ports that her glasses are over 29 years old and feels that when she wears them her eyes will not focus correctly.  Has had no acute changes in vision.  No eye pain.  Respiratory: Negative for cough, chest tightness, shortness of breath, wheezing and stridor.   Cardiovascular: Negative for chest pain, palpitations and leg swelling.  Gastrointestinal: Negative for abdominal pain, nausea and vomiting.  Endocrine: Negative for polydipsia, polyphagia and polyuria.  Genitourinary: Negative for decreased urine volume, difficulty urinating, dysuria, enuresis, frequency, hematuria, pelvic pain, urgency, vaginal bleeding and vaginal discharge.       Currently not sexually active.  Reports he is in relationship with significant other but plans on waiting to have sexual intercourse until after they get married.  Skin: Positive for rash.       Has a history of acne rosacea.  Reports that it can tend to get worse if she gets hot or with stress.  Gets outbreaks on cheeks and nose.  Would like medication.  Neurological: Negative for dizziness, syncope, light-headedness and numbness.       Denies any seizures.  Reports compliance with medication.  Hematological: Negative for adenopathy.    Psychiatric/Behavioral: Positive for decreased concentration. Negative for confusion, dysphoric mood, hallucinations, self-injury, sleep disturbance and suicidal ideas. The patient is nervous/anxious. The patient does not have insomnia and is not hyperactive.      Objective:   BP 116/74 (BP Location: Left Arm, Patient Position: Sitting, Cuff Size: Normal)   Pulse 63   Temp 97.6 F (36.4 C) (Temporal)   Resp 16   Ht 5\' 2"  (1.575 m)   Wt 246 lb (111.6 kg)   SpO2 98%   BMI 44.99 kg/m   Physical Exam  Constitutional: She is oriented to person, place, and time. She appears well-developed and well-nourished. No distress.  Obese female.  Appearance consistent with stated age.  Pleasant.  Well dressed well groomed.  HENT:  Head: Normocephalic and atraumatic.  Right Ear: Tympanic membrane, external ear and ear canal normal.  Left Ear: Tympanic membrane, external ear and ear canal normal.  Nose: Nose normal.  Mouth/Throat: Oropharynx is clear and moist. No oropharyngeal exudate.  Eyes: Conjunctivae and EOM are normal. Pupils are equal, round, and reactive to light. Right eye exhibits no discharge. Left eye exhibits no discharge. No scleral icterus.  Neck: Normal range of motion. Neck supple. No JVD present. No tracheal deviation present. No thyromegaly present.  Cardiovascular: Normal rate, regular rhythm and normal heart sounds.  Pulmonary/Chest: Effort normal and breath sounds normal. No respiratory distress.  Abdominal: Soft. Bowel sounds are normal. She exhibits no distension and no mass. There is no tenderness. There is no guarding.  Obese  Musculoskeletal: Normal range of motion.  Lymphadenopathy:    She has no cervical adenopathy.  Neurological: She is alert and oriented to person, place, and time. She displays normal reflexes. No cranial nerve deficit or sensory deficit. She exhibits normal muscle tone. Coordination normal.  Skin: Skin is warm and dry.  Bilateral cheeks with  scattered erythematous, confluent, scattered papules and pustules consistent with acneiform lesions with evidence of superficial excoriation.  No comedones present.  No telangiectasias noted.  Psychiatric: Her behavior is normal. Judgment and thought content normal. Her mood appears anxious. Her affect is not labile and not inappropriate. Her speech is not rapid and/or pressured. She is not slowed, not withdrawn and not actively hallucinating. Cognition and memory are normal. She expresses no homicidal ideation. She expresses no suicidal plans and no homicidal plans.  Anxious mood.  Affect consistent with mood. She is attentive.  Nursing note and vitals reviewed.    Assessment and Plan  1. Well adult exam Age-appropriate anticipatory guidance given and discussed.  Specifically we discussed diet, weight loss, obesity, nutrition, and exercise.  We discussed the need for changes in lifestyle and behavior in order to decrease her chances of developing diabetes, hypertension, and cardiovascular disease.  We also discussed that behavior and lifestyle changes would help with her mood and could help decrease her anxiety. - Lipid panel - Hepatic function panel - CBC with Differential/Platelet  2. Bilateral hearing loss, unspecified hearing loss type Referral to ENT for audiology evaluation. - Ambulatory referral to ENT  3. Changes in vision Patient reports that she needs a vision check and new glasses.  I told her that I would place a referral but I was not sure if it would be covered under her insurance. - Ambulatory referral to Ophthalmology  4. Immunization due Influenza vaccine given today. - Tdap vaccine greater than or equal to 7yo IM  5. Elevated blood sugar Review of chart and blood work by previous gynecologist revealed elevated blood sugar.  Due to patient's weight and family history I recommend screening for diabetes at this point. - Hemoglobin A1c - Basic metabolic panel  6. Screen  for STD (sexually transmitted disease) Defers testing for gonorrhea chlamydia and other vaginal STDs. - HIV antibody - RPR  7. Fatigue, unspecified type  - Vitamin B12 - VITAMIN D 25 Hydroxy (Vit-D Deficiency, Fractures) - TSH  8. Acne rosacea Counseling given regarding rosacea and skin care.  Handouts were also given today.  Trial of medication and follow-up as directed. - metroNIDAZOLE (METROGEL) 1 % gel; Apply topically daily.  Dispense: 45 g; Refill: 0  9. Anxiety Anxiety and its treatment discussed with patient at length today.  Today we discussed SSRI use, mechanism of action, side effects, medication compliance, and risks versus benefits of medication.  At this time patient wishes to  try Lexapro.  Will start Lexapro 10 mg 1 p.o. daily.  She will follow-up in 1 month or sooner if needed.  She was counseled regarding mood changes.  She was told if she develops any worsening of her mood or if she develops any suicidal or homicidal ideations to please contact medical help.  She voiced understanding. - escitalopram (LEXAPRO) 10 MG tablet; Take 1 tablet (10 mg total) by mouth daily.  Dispense: 30 tablet; Refill: 0  10. Need for immunization against influenza  - Flu Vaccine QUAD 36+ mos IM  Patient will plan to send her copy of her labs in the mail and fax a copy to Dr. Gerilyn Pilgrim her neurologist.  Return in about 3 weeks (around 04/09/2017) for Follow-up. Aliene Beams, MD 03/19/2017

## 2017-03-19 NOTE — Patient Instructions (Addendum)
Rosacea Rosacea is a long-term (chronic) condition that affects the skin of the face, including the cheeks, nose, brow, and chin. This condition can also affect the eyes. Rosacea causes blood vessels near the surface of the skin to get bigger (be enlarged), and that makes the skin red. There is no cure for this condition, but treatment can help to control your symptoms. Follow these instructions at home: Skin Care Take care of your skin as told by your doctor. Your doctor may tell you do these things:  Wash your skin gently two or more times each day.  Use mild soap.  Use a sunscreen or sunblock with SPF 30 or greater.  Use gentle cosmetics that are meant for sensitive skin.  Shave with an electric shaver instead of a blade.  Lifestyle  Try to keep track of what foods trigger this condition. Avoid any triggers. These may include: ? Spicy foods. ? Seafood. ? Cheese. ? Hot liquids. ? Nuts. ? Chocolate. ? Iodized salt.  Do not drink alcohol.  Avoid extremely cold or hot temperatures.  Try to reduce your stress. If you need help to do this, talk with your doctor.  When you exercise, do these things to stay cool: ? Limit your sun exposure. ? Use a fan. ? Exercise for a shorter time, and exercise more often. General instructions  Keep all follow-up visits as told by your doctor. This is important.  Take over-the-counter and prescription medicines only as told by your doctor.  If your eyelids are affected, press warm compresses to them. Do this as told by your doctor.  If you were prescribed an antibiotic medicine, apply or take it as told by your doctor. Do not stop using the antibiotic even if your condition improves. Contact a doctor if:  Your symptoms get worse.  Your symptoms do not improve after two months of treatment.  You have new symptoms.  You have any changes in vision or you have problems with your eyes, such as redness or itching.  You feel very sad  (depressed).  You lose your appetite.  You have trouble concentrating. This information is not intended to replace advice given to you by your health care provider. Make sure you discuss any questions you have with your health care provider. Document Released: 05/19/2011 Document Revised: 08/02/2015 Document Reviewed: 05/03/2014 Elsevier Interactive Patient Education  2018 [employer].  Rosacea Rosacea is a long-term (chronic) condition that affects the skin of the face, including the cheeks, nose, brow, and chin. This condition can also affect the eyes. Rosacea causes blood vessels near the surface of the skin to enlarge, which results in redness. What are the causes? The cause of this condition is not known. Certain triggers can make rosacea worse, including:  Hot baths.  Exercise.  Sunlight.  Very hot or cold temperatures.  Hot or spicy foods and drinks.  Drinking alcohol.  Stress.  Taking blood pressure medicine.  Long-term use of topical steroids on the face.  What increases the risk? This condition is more likely to develop in:  People who are older than 33 years of age.  Women.  People who have light-colored skin (light complexion).  People who have a family history of rosacea.  What are the signs or symptoms? Symptoms of this condition include:  Redness of the face.  Red bumps or pimples on the face.  A red, enlarged nose.  Blushing easily.  Red lines on the skin.  Irritated or burning feeling in the  eyes.  Swollen eyelids.  How is this diagnosed? This condition is diagnosed with a medical history and physical exam. How is this treated? There is no cure for this condition, but treatment can help to control your symptoms. Your health care provider may recommend that you see a skin specialist (dermatologist). Treatment may include:  Antibiotic medicines that are applied to the skin or taken as a pill.  Laser treatment to improve the  appearance of the skin.  Surgery. This is rare.  Your health care provider will also recommend the best way to take care of your skin. Even after your skin improves, you will likely need to continue treatment to prevent your rosacea from coming back. Follow these instructions at home: Skin Care Take care of your skin as told by your health care provider. You may be told to do these things:  Wash your skin gently two or more times each day.  Use mild soap.  Use a sunscreen or sunblock with SPF 30 or greater.  Use gentle cosmetics that are meant for sensitive skin.  Shave with an electric shaver instead of a blade.  Lifestyle  Try to keep track of what foods trigger this condition. Avoid any triggers. These may include: ? Spicy foods. ? Seafood. ? Cheese. ? Hot liquids. ? Nuts. ? Chocolate. ? Iodized salt.  Do not drink alcohol.  Avoid extremely cold or hot temperatures.  Try to reduce your stress. If you need help, talk with your health care provider.  When you exercise, do these things to stay cool: ? Limit your sun exposure. ? Use a fan. ? Do shorter and more frequent intervals of exercise. General instructions  Keep all follow-up visits as told by your health care provider. This is important.  Take over-the-counter and prescription medicines only as told by your health care provider.  If your eyelids are affected, apply warm compresses to them. Do this as told by your health care provider.  If you were prescribed an antibiotic medicine, apply or take it as told by your health care provider. Do not stop using the antibiotic even if your condition improves. Contact a health care provider if:  Your symptoms get worse.  Your symptoms do not improve after two months of treatment.  You have new symptoms.  You have any changes in vision or you have problems with your eyes, such as redness or itching.  You feel depressed.  You lose your appetite.  You have  trouble concentrating. This information is not intended to replace advice given to you by your health care provider. Make sure you discuss any questions you have with your health care provider. Document Released: 04/03/2004 Document Revised: 08/02/2015 Document Reviewed: 05/03/2014 Elsevier Interactive Patient Education  [employer]2018 [employer]

## 2017-03-20 ENCOUNTER — Encounter: Payer: Self-pay | Admitting: Family Medicine

## 2017-03-20 LAB — CBC WITH DIFFERENTIAL/PLATELET
BASOS ABS: 78 {cells}/uL (ref 0–200)
Basophils Relative: 0.7 %
Eosinophils Absolute: 101 cells/uL (ref 15–500)
Eosinophils Relative: 0.9 %
HEMATOCRIT: 45.1 % — AB (ref 35.0–45.0)
Hemoglobin: 15.1 g/dL (ref 11.7–15.5)
Lymphs Abs: 4413 cells/uL — ABNORMAL HIGH (ref 850–3900)
MCH: 28.1 pg (ref 27.0–33.0)
MCHC: 33.5 g/dL (ref 32.0–36.0)
MCV: 84 fL (ref 80.0–100.0)
MPV: 10 fL (ref 7.5–12.5)
Monocytes Relative: 8.4 %
NEUTROS PCT: 50.6 %
Neutro Abs: 5667 cells/uL (ref 1500–7800)
PLATELETS: 448 10*3/uL — AB (ref 140–400)
RBC: 5.37 10*6/uL — ABNORMAL HIGH (ref 3.80–5.10)
RDW: 12.7 % (ref 11.0–15.0)
TOTAL LYMPHOCYTE: 39.4 %
WBC: 11.2 10*3/uL — AB (ref 3.8–10.8)
WBCMIX: 941 {cells}/uL (ref 200–950)

## 2017-03-20 LAB — HIV ANTIBODY (ROUTINE TESTING W REFLEX): HIV 1&2 Ab, 4th Generation: NONREACTIVE

## 2017-03-20 LAB — BASIC METABOLIC PANEL
BUN: 10 mg/dL (ref 7–25)
CALCIUM: 9.1 mg/dL (ref 8.6–10.2)
CO2: 26 mmol/L (ref 20–32)
Chloride: 104 mmol/L (ref 98–110)
Creat: 0.73 mg/dL (ref 0.50–1.10)
Glucose, Bld: 81 mg/dL (ref 65–139)
Potassium: 4.6 mmol/L (ref 3.5–5.3)
SODIUM: 138 mmol/L (ref 135–146)

## 2017-03-20 LAB — HEPATIC FUNCTION PANEL
AG Ratio: 1.2 (calc) (ref 1.0–2.5)
ALT: 19 U/L (ref 6–29)
AST: 17 U/L (ref 10–30)
Albumin: 4.1 g/dL (ref 3.6–5.1)
Alkaline phosphatase (APISO): 89 U/L (ref 33–115)
BILIRUBIN DIRECT: 0.1 mg/dL (ref 0.0–0.2)
BILIRUBIN TOTAL: 0.6 mg/dL (ref 0.2–1.2)
GLOBULIN: 3.4 g/dL (ref 1.9–3.7)
Indirect Bilirubin: 0.5 mg/dL (calc) (ref 0.2–1.2)
Total Protein: 7.5 g/dL (ref 6.1–8.1)

## 2017-03-20 LAB — LIPID PANEL
CHOLESTEROL: 191 mg/dL (ref <200)
HDL: 32 mg/dL — ABNORMAL LOW (ref >50)
LDL CHOLESTEROL (CALC): 137 mg/dL — AB
Non-HDL Cholesterol (Calc): 159 mg/dL (calc) — ABNORMAL HIGH (ref <130)
TRIGLYCERIDES: 113 mg/dL (ref <150)
Total CHOL/HDL Ratio: 6 (calc) — ABNORMAL HIGH (ref <5.0)

## 2017-03-20 LAB — HEMOGLOBIN A1C
HEMOGLOBIN A1C: 5.4 %{Hb} (ref <5.7)
Mean Plasma Glucose: 108 (calc)
eAG (mmol/L): 6 (calc)

## 2017-03-20 LAB — RPR: RPR: NONREACTIVE

## 2017-03-20 LAB — TSH: TSH: 1.7 m[IU]/L

## 2017-03-20 LAB — VITAMIN D 25 HYDROXY (VIT D DEFICIENCY, FRACTURES): Vit D, 25-Hydroxy: 18 ng/mL — ABNORMAL LOW (ref 30–100)

## 2017-03-20 LAB — VITAMIN B12: VITAMIN B 12: 419 pg/mL (ref 200–1100)

## 2017-04-21 ENCOUNTER — Ambulatory Visit (INDEPENDENT_AMBULATORY_CARE_PROVIDER_SITE_OTHER): Payer: Medicaid Other | Admitting: Family Medicine

## 2017-04-21 ENCOUNTER — Other Ambulatory Visit: Payer: Self-pay

## 2017-04-21 ENCOUNTER — Encounter: Payer: Self-pay | Admitting: Family Medicine

## 2017-04-21 VITALS — BP 130/82 | HR 76 | Temp 98.7°F | Resp 16 | Ht 62.0 in | Wt 249.2 lb

## 2017-04-21 DIAGNOSIS — F419 Anxiety disorder, unspecified: Secondary | ICD-10-CM | POA: Insufficient documentation

## 2017-04-21 DIAGNOSIS — Z6841 Body Mass Index (BMI) 40.0 and over, adult: Secondary | ICD-10-CM | POA: Diagnosis not present

## 2017-04-21 DIAGNOSIS — E559 Vitamin D deficiency, unspecified: Secondary | ICD-10-CM | POA: Diagnosis not present

## 2017-04-21 DIAGNOSIS — E538 Deficiency of other specified B group vitamins: Secondary | ICD-10-CM | POA: Diagnosis not present

## 2017-04-21 DIAGNOSIS — E782 Mixed hyperlipidemia: Secondary | ICD-10-CM | POA: Diagnosis not present

## 2017-04-21 DIAGNOSIS — F411 Generalized anxiety disorder: Secondary | ICD-10-CM | POA: Insufficient documentation

## 2017-04-21 MED ORDER — ESCITALOPRAM OXALATE 10 MG PO TABS: 10.0000 mg | ORAL_TABLET | Freq: Every day | ORAL | 1 refills | Status: DC

## 2017-04-21 MED ORDER — VITAMIN D (ERGOCALCIFEROL) 1.25 MG (50000 UNIT) PO CAPS: 50000.0000 [IU] | ORAL_CAPSULE | ORAL | 0 refills | Status: DC

## 2017-04-21 NOTE — Patient Instructions (Addendum)
Vitamin B12 1000 mcg a day  AHA diet  Low cholesterol diet Stop sodas Cholesterol Cholesterol is a white, waxy, fat-like substance that is needed by the human body in small amounts. The liver makes all the cholesterol we need. Cholesterol is carried from the liver by the blood through the blood vessels. Deposits of cholesterol (plaques) may build up on blood vessel (artery) walls. Plaques make the arteries narrower and stiffer. Cholesterol plaques increase the risk for heart attack and stroke. You cannot feel your cholesterol level even if it is very high. The only way to know that it is high is to have a blood test. Once you know your cholesterol levels, you should keep a record of the test results. Work with your health care provider to keep your levels in the desired range. What do the results mean?  Total cholesterol is a rough measure of all the cholesterol in your blood.  LDL (low-density lipoprotein) is the "bad" cholesterol. This is the type that causes plaque to build up on the artery walls. You want this level to be low.  HDL (high-density lipoprotein) is the "good" cholesterol because it cleans the arteries and carries the LDL away. You want this level to be high.  Triglycerides are fat that the body can either burn for energy or store. High levels are closely linked to heart disease. What are the desired levels of cholesterol?  Total cholesterol below 200.  LDL below 100 for people who are at risk, below 70 for people at very high risk.  HDL above 40 is good. A level of 60 or higher is considered to be protective against heart disease.  Triglycerides below 150. How can I lower my cholesterol? Diet Follow your diet program as told by your health care provider.  Choose fish or white meat chicken and [place], roasted or baked. Limit fatty cuts of red meat, fried foods, and processed meats, such as sausage and lunch meats.  Eat lots of fresh fruits and vegetables.  Choose  whole grains, beans, pasta, potatoes, and cereals.  Choose olive oil, corn oil, or canola oil, and use only small amounts.  Avoid butter, mayonnaise, shortening, or palm kernel oils.  Avoid foods with trans fats.  Drink skim or nonfat milk and eat low-fat or nonfat yogurt and cheeses. Avoid whole milk, cream, ice cream, egg yolks, and full-fat cheeses.  Healthier desserts include angel food cake, ginger snaps, animal crackers, hard candy, popsicles, and low-fat or nonfat frozen yogurt. Avoid pastries, cakes, pies, and cookies.  Exercise  Follow your exercise program as told by your health care provider. A regular program: ? Helps to decrease LDL and raise HDL. ? Helps with weight control.  Do things that increase your activity level, such as gardening, walking, and taking the stairs.  Ask your health care provider about ways that you can be more active in your daily life.  Medicine  Take over-the-counter and prescription medicines only as told by your health care provider. ? Medicine may be prescribed by your health care provider to help lower cholesterol and decrease the risk for heart disease. This is usually done if diet and exercise have failed to bring down cholesterol levels. ? If you have several risk factors, you may need medicine even if your levels are normal.  This information is not intended to replace advice given to you by your health care provider. Make sure you discuss any questions you have with your health care provider. Document Released: 11/19/2000 Document  Revised: 09/22/2015 Document Reviewed: 08/25/2015 Elsevier Interactive Patient Education  2018 [employer]  Mediterranean Diet A Mediterranean diet refers to food and lifestyle choices that are based on the traditions of countries located on the [place]. This way of eating has been shown to help prevent certain conditions and improve outcomes for people who have chronic diseases, like kidney  disease and heart disease. What are tips for following this plan? Lifestyle  Cook and eat meals together with your family, when possible.  Drink enough fluid to keep your urine clear or pale yellow.  Be physically active every day. This includes: ? Aerobic exercise like running or swimming. ? Leisure activities like gardening, walking, or housework.  Get 7-8 hours of sleep each night.  If recommended by your health care provider, drink red wine in moderation. This means 1 glass a day for nonpregnant women and 2 glasses a day for men. A glass of wine equals 5 oz (150 mL). Reading food labels  Check the serving size of packaged foods. For foods such as rice and pasta, the serving size refers to the amount of cooked product, not dry.  Check the total fat in packaged foods. Avoid foods that have saturated fat or trans fats.  Check the ingredients list for added sugars, such as corn syrup. Shopping  At the grocery store, buy most of your food from the areas near the walls of the store. This includes: ? Fresh fruits and vegetables (produce). ? Grains, beans, nuts, and seeds. Some of these may be available in unpackaged forms or large amounts (in bulk). ? Fresh seafood. ? Poultry and eggs. ? Low-fat dairy products.  Buy whole ingredients instead of prepackaged foods.  Buy fresh fruits and vegetables in-season from local farmers markets.  Buy frozen fruits and vegetables in resealable bags.  If you do not have access to quality fresh seafood, buy precooked frozen shrimp or canned fish, such as tuna, salmon, or sardines.  Buy small amounts of raw or cooked vegetables, salads, or olives from the deli or salad bar at your store.  Stock your pantry so you always have certain foods on hand, such as olive oil, canned tuna, canned tomatoes, rice, pasta, and beans. Cooking  Cook foods with extra-virgin olive oil instead of using butter or other vegetable oils.  Have meat as a side dish,  and have vegetables or grains as your main dish. This means having meat in small portions or adding small amounts of meat to foods like pasta or stew.  Use beans or vegetables instead of meat in common dishes like chili or lasagna.  Experiment with different cooking methods. Try roasting or broiling vegetables instead of steaming or sauteing them.  Add frozen vegetables to soups, stews, pasta, or rice.  Add nuts or seeds for added healthy fat at each meal. You can add these to yogurt, salads, or vegetable dishes.  Marinate fish or vegetables using olive oil, lemon juice, garlic, and fresh herbs. Meal planning  Plan to eat 1 vegetarian meal one day each week. Try to work up to 2 vegetarian meals, if possible.  Eat seafood 2 or more times a week.  Have healthy snacks readily available, such as: ? Vegetable sticks with hummus. ? [place] yogurt. ? Fruit and nut trail mix.  Eat balanced meals throughout the week. This includes: ? Fruit: 2-3 servings a day ? Vegetables: 4-5 servings a day ? Low-fat dairy: 2 servings a day ? Fish, poultry, or lean meat: 1  serving a day ? Beans and legumes: 2 or more servings a week ? Nuts and seeds: 1-2 servings a day ? Whole grains: 6-8 servings a day ? Extra-virgin olive oil: 3-4 servings a day  Limit red meat and sweets to only a few servings a month What are my food choices?  Mediterranean diet ? Recommended ? Grains: Whole-grain pasta. Brown rice. Bulgar wheat. Polenta. Couscous. Whole-wheat bread. Orpah Cobb. ? Vegetables: Artichokes. Beets. Broccoli. Cabbage. Carrots. Eggplant. Green beans. Chard. Kale. Spinach. Onions. Leeks. Peas. Squash. Tomatoes. Peppers. Radishes. ? Fruits: Apples. Apricots. Avocado. Berries. Bananas. Cherries. Dates. Figs. Grapes. Lemons. Melon. Oranges. Peaches. Plums. Pomegranate. ? Meats and other protein foods: Beans. Almonds. Sunflower seeds. Pine nuts. Peanuts. Cod. Salmon. Scallops. Shrimp. Tuna. Tilapia.  Clams. Oysters. Eggs. ? Dairy: Low-fat milk. Cheese. Greek yogurt. ? Beverages: Water. Red wine. Herbal tea. ? Fats and oils: Extra virgin olive oil. Avocado oil. Grape seed oil. ? Sweets and desserts: [place] yogurt with honey. Baked apples. Poached pears. Trail mix. ? Seasoning and other foods: Basil. Cilantro. Coriander. Cumin. Mint. Parsley. Sage. Rosemary. Tarragon. Garlic. Oregano. Thyme. Pepper. Balsalmic vinegar. Tahini. Hummus. Tomato sauce. Olives. Mushrooms. ? Limit these ? Grains: Prepackaged pasta or rice dishes. Prepackaged cereal with added sugar. ? Vegetables: Deep fried potatoes (french fries). ? Fruits: Fruit canned in syrup. ? Meats and other protein foods: Beef. Pork. Lamb. Poultry with skin. Hot dogs. Tomasa Blase. ? Dairy: Ice cream. Sour cream. Whole milk. ? Beverages: Juice. Sugar-sweetened soft drinks. Beer. Liquor and spirits. ? Fats and oils: Butter. Canola oil. Vegetable oil. Beef fat (tallow). Lard. ? Sweets and desserts: Cookies. Cakes. Pies. Candy. ? Seasoning and other foods: Mayonnaise. Premade sauces and marinades. ? The items listed may not be a complete list. Talk with your dietitian about what dietary choices are right for you. Summary  The Mediterranean diet includes both food and lifestyle choices.  Eat a variety of fresh fruits and vegetables, beans, nuts, seeds, and whole grains.  Limit the amount of red meat and sweets that you eat.  Talk with your health care provider about whether it is safe for you to drink red wine in moderation. This means 1 glass a day for nonpregnant women and 2 glasses a day for men. A glass of wine equals 5 oz (150 mL). This information is not intended to replace advice given to you by your health care provider. Make sure you discuss any questions you have with your health care provider. Document Released: 10/18/2015 Document Revised: 11/20/2015 Document Reviewed: 10/18/2015 Elsevier Interactive Patient Education  [employer].

## 2017-04-21 NOTE — Progress Notes (Signed)
Patient ID: [name], female    DOB: 09-Nov-1985, 32 y.o.   MRN: [id number]  Chief Complaint  Patient presents with  . Follow-up    Allergies Patient has no known allergies.  Subjective:   [name] is a 32 y.o. female who [facility]  HPI Here for follow up on her mood and to discuss her labs.  She reports that she has been taking Lexapro each day.  Reports that initially the medication made her a bit sleepy and did not feel great with the medication.  However she reports that passed after about 3 days.  Reports that she believes the medication is helping her.  She does not feel as stressed, anxious, or irritable.  Feels like her mood is better.  She reports that she is able to deal with life stressors in a more manageable fashion.  She has been trying to make some improvements in her diet.  Has previously been drinking approximately 1-2, 2 L sodas a day.  Has cut down on that amount, but still drinks 2, 20 oz sodas a day.  Is still eating a lot of fast foods at least once a day through drive-through.  Reports went to [employer] and had raised beef sandwich and sliders and fries before coming to this visit.  Reports that she also knew she was coming in to discuss her cholesterol was going to need to make some changes after this visit.  Reports that she has a strong family history of high cholesterol, hypertension, diabetes, and heart issues.  She does not smoke cigarettes.  Does not exercise on a regular basis.  Has been obese for a long time.  Has never been told she has high cholesterol in the past, but not sure if it is even been checked.  Is motivated to lose weight.  Believes that she needs some counseling on diet.  Believes that now since she is not so stressed she can make some positive changes for her health.  Has been trying to be more active around the house and at least do some minimal walking around the house during the day.    Past Medical  History:  Diagnosis Date  . Anemia   . History of medication noncompliance   . Seizures (HCC)     No past surgical history on file.  Family History  Problem Relation Age of Onset  . Arthritis Mother   . Depression Mother   . Hypertension Mother   . Breast cancer Mother        lymph nodes  . Heart disease Father   . Arthritis Maternal Grandmother   . Cancer Maternal Grandmother        breast  . Diabetes Maternal Grandmother   . Cancer Maternal Grandfather        lung  . Stroke Paternal Grandmother   . Heart disease Paternal Grandfather      Social History   Socioeconomic History  . Marital status: Divorced    Spouse name: None  . Number of children: None  . Years of education: None  . Highest education level: None  Social Needs  . Financial resource strain: None  . Food insecurity - worry: None  . Food insecurity - inability: None  . Transportation needs - medical: None  . Transportation needs - non-medical: None  Occupational History  . None  Tobacco Use  . Smoking status: Never Smoker  . Smokeless tobacco: Never Used  Substance and Sexual Activity  . Alcohol use: No    Alcohol/week: 0.0 oz  . Drug use: No  . Sexual activity: Not Currently    Birth control/protection: IUD  Other Topics Concern  . 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   Has two children, 2 boys   Divorced.    Eats all food groups.   Wears seatbelt.    Attends church.     Review of Systems  Constitutional: Negative for activity change, appetite change, fatigue and unexpected weight change.  Respiratory: Negative for chest tightness.   Cardiovascular: Negative for chest pain.  Gastrointestinal: Negative for diarrhea and nausea.  Skin: Negative for rash.  Neurological: Negative for numbness and headaches.  Psychiatric/Behavioral: Negative for agitation, decreased concentration, dysphoric mood, sleep disturbance and suicidal ideas. The patient is not  nervous/anxious.      Objective:   BP 130/82 (BP Location: Left Arm, Patient Position: Sitting, Cuff Size: Normal)   Pulse 76   Temp 98.7 F (37.1 C) (Temporal)   Resp 16   Ht 5\' 2"  (1.575 m)   Wt 249 lb 4 oz (113.1 kg)   SpO2 98%   BMI 45.59 kg/m   Physical Exam Pleasant, well dressed, well-groomed female in no acute distress.  Appearance consistent with stated age.  Heart regular rate and rhythm.  Lungs clear to auscultation bilaterally.  Mood euthymic.  Affect congruent with mood.  No suicidal or homicidal ideations.  Behavior within normal limits.  Cranial nerves II through XII grossly intact.  Assessment and Plan  1. Vitamin D deficiency Vitamin D levels discussed with patient.  Will start supplementation at this time.  We will plan to recheck levels in 3 months and then if levels within normal range, will start over-the-counter supplementation. - Vitamin D, Ergocalciferol, (DRISDOL) 50000 units CAPS capsule; Take 1 capsule (50,000 Units total) by mouth every 7 (seven) days.  Dispense: 12 capsule; Refill: 0 - Vitamin D (25 hydroxy)  2. Low vitamin B12 level Levels discussed with patient today.  At this time will start supplementation over-the-counter with B12 1000 mcg p.o. daily.  3. Mixed hyperlipidemia Cholesterol values discussed with patient in detail today.  Elevated LDL, low HDL discussed in detail.  We discussed this ramification for her current and her long-term health.  We discussed changes that she could make in her diet and her lifestyle that would improve these values.  Mediterranean diet recommended.  Exercise and diet rich in omega-3's also recommended to help with HDL.  She was given printed information and website resources to learn more about cholesterol.  Discussed with patient that she needs to quit eating out at fast food restaurants so frequently and to make better/healthier food choices when she does eat out.  She voiced understanding.  She was offered to see  a dietitian/nutritionist at this time to aid in cholesterol and weight reduction.  She defers at this time but would like to make an attempt on her own over the next 3 months.  We will plan to recheck cholesterol in 3 months and then if she is not had improvement consider nutritionist at that time. - Lipid panel  4. Anxiety Improved on Lexapro.  Continue medication.  Refill given.  Patient counseled concerning worrisome signs and symptoms of worsening mood.  She was told if she developed any problems with her mood or any suicidal or homicidal ideations to contact medical help.  She voiced understanding and has  this information. - escitalopram (LEXAPRO) 10 MG tablet; Take 1 tablet (10 mg total) by mouth daily.  Dispense: 90 tablet; Refill: 1  5.  Obesity Diet, exercise, and weight loss discussed with patient today.  She is in agreement to make changes that could aid in her losing weight.  We will discuss more at next visit and continue to monitor her weight.  Office visit was 25 minutes.  Greater than 50% of office visit spent counseling and coordinating care.  Patient was counseled on cholesterol, vitamin D, weight loss, Mediterranean diet, and all of her labs were reviewed at the visit today. No Follow-up on file. Aliene Beams, MD 04/21/2017

## 2017-04-23 ENCOUNTER — Ambulatory Visit (INDEPENDENT_AMBULATORY_CARE_PROVIDER_SITE_OTHER): Payer: Medicaid Other | Admitting: Otolaryngology

## 2017-04-23 DIAGNOSIS — H93293 Other abnormal auditory perceptions, bilateral: Secondary | ICD-10-CM

## 2017-05-31 ENCOUNTER — Emergency Department (HOSPITAL_COMMUNITY)
Admission: EM | Admit: 2017-05-31 | Discharge: 2017-05-31 | Disposition: A | Discharge status: Home / Self Care | Payer: Medicaid Other | Attending: Emergency Medicine | Admitting: Emergency Medicine

## 2017-05-31 ENCOUNTER — Encounter (HOSPITAL_COMMUNITY): Payer: Self-pay | Admitting: Emergency Medicine

## 2017-05-31 DIAGNOSIS — Z79899 Other long term (current) drug therapy: Secondary | ICD-10-CM | POA: Insufficient documentation

## 2017-05-31 DIAGNOSIS — R569 Unspecified convulsions: Secondary | ICD-10-CM | POA: Insufficient documentation

## 2017-05-31 DIAGNOSIS — E782 Mixed hyperlipidemia: Secondary | ICD-10-CM | POA: Insufficient documentation

## 2017-05-31 MED ORDER — LEVETIRACETAM 500 MG PO TABS
500.0000 mg | ORAL_TABLET | ORAL | Status: AC
  Administered 2017-05-31: 500 mg via ORAL
  Filled 2017-05-31: qty 1

## 2017-05-31 NOTE — ED Provider Notes (Signed)
[facility] EMERGENCY DEPARTMENT Provider Note   CSN: [id number] Arrival date & time: 05/31/17  0225     History   Chief Complaint Chief Complaint  Patient presents with  . Seizures    HPI [name] is a 32 y.o. female.  The history is provided by the patient and a relative.  She has a history of seizures, hyperlipidemia, and medication noncompliance.  She was brought to the emergency department after having a seizure at home.  This was witnessed by her sons.  She denies incontinence.  She did have a bit lip.  Duration of seizure is not certain.  She states that she had missed her medications yesterday, but had taken them today.  Past Medical History:  Diagnosis Date  . Anemia   . History of medication noncompliance   . Seizures [facility](HCC)     Patient Active Problem List   Diagnosis Date Noted  . Anxiety 04/21/2017  . Low vitamin B12 level 04/21/2017  . Class 3 severe obesity due to excess calories with serious comorbidity and body mass index (BMI) of 45.0 to 49.9 in adult (HCC) 04/21/2017  . Vitamin D deficiency 04/21/2017  . Mixed hyperlipidemia 04/21/2017  . Encounter for IUD insertion 12/19/2015  . Seizures (HCC)     History reviewed. No pertinent surgical history.   OB History    Gravida      Para      Term      Preterm      AB      Living  2     SAB      TAB      Ectopic      Multiple      Live Births               Home Medications    Prior to Admission medications   Medication Sig Start Date End Date Taking? Authorizing Provider  escitalopram (LEXAPRO) 10 MG tablet Take 1 tablet (10 mg total) by mouth daily. 04/21/17  Yes Aliene BeamsHagler, Rachel, MD  levETIRAcetam (KEPPRA) 500 MG tablet Take 1 tablet (500 mg total) by mouth 2 (two) times daily. 01/05/16  Yes Ward, Layla MawKristen N, DO  levonorgestrel (MIRENA) 20 MCG/24HR IUD 1 each by Intrauterine route once.   Yes [provider]  Vitamin D, Ergocalciferol, (DRISDOL) 50000 units CAPS  capsule Take 1 capsule (50,000 Units total) by mouth every 7 (seven) days. 04/21/17  Yes Hagler, Fleet Contrasachel, MD  metroNIDAZOLE (METROGEL) 1 % gel Apply topically daily. 03/19/17   Aliene BeamsHagler, Rachel, MD    Family History Family History  Problem Relation Age of Onset  . Arthritis Mother   . Depression Mother   . Hypertension Mother   . Breast cancer Mother        lymph nodes  . Heart disease Father   . Arthritis Maternal Grandmother   . Cancer Maternal Grandmother        breast  . Diabetes Maternal Grandmother   . Cancer Maternal Grandfather        lung  . Stroke Paternal Grandmother   . Heart disease Paternal Grandfather     Social History Social History   Tobacco Use  . Smoking status: Never Smoker  . Smokeless tobacco: Never Used  Substance Use Topics  . Alcohol use: No    Alcohol/week: 0.0 oz  . Drug use: No     Allergies   Patient has no known allergies.   Review of Systems Review of Systems  All other systems reviewed and are negative.    Physical Exam Updated Vital Signs BP 124/71 (BP Location: Right Arm)   Pulse 91   Temp 98.5 F (36.9 C) (Oral)   Resp 18   Ht 5\' 2"  (1.575 m)   Wt 103.4 kg (228 lb)   SpO2 96%   BMI 41.70 kg/m   Physical Exam  Nursing note and vitals reviewed.  32 year old female, resting comfortably and in no acute distress. Vital signs are normal. Oxygen saturation is 96%, which is normal. Head is normocephalic and atraumatic. PERRLA, EOMI. Oropharynx is clear.  Bite mark present on lower lip without active bleeding. Neck is nontender and supple without adenopathy or JVD. Back is nontender and there is no CVA tenderness. Lungs are clear without rales, wheezes, or rhonchi. Chest is nontender. Heart has regular rate and rhythm without murmur. Abdomen is soft, flat, nontender without masses or hepatosplenomegaly and peristalsis is normoactive. Extremities have no cyanosis or edema, full range of motion is present. Skin is warm and dry  without rash. Neurologic: Mental status is normal, cranial nerves are intact, there are no motor or sensory deficits.  ED Treatments / Results   Procedures Procedures   Medications Ordered in ED Medications  levETIRAcetam (KEPPRA) tablet 500 mg (has no administration in time range)     Initial Impression / Assessment and Plan / ED Course  I have reviewed the triage vital signs and the nursing notes.  Seizure secondary to medication noncompliance.  She is given an additional dose of levetiracetam in the emergency department.  Old records are reviewed, and she has prior ED visits with similar presentation.  She is advised of [place] law stating no one may drive who has had a seizure in the past 6 months.  Patient states that she was told that she could drive because EEG was normal and seizures only occurred at night.  I have advised her that that is not my understanding of the law.  Final Clinical Impressions(s) / ED Diagnoses   Final diagnoses:  Seizure [facility])    ED Discharge Orders    None       Dione Booze, MD 05/31/17 0403

## 2017-05-31 NOTE — ED Triage Notes (Signed)
Pt brought in by [facility] for seizure had at home in presence of children. Pt states she missed her seizure medication a couple of days ago. Pt currently alert and oriented. Pt c/o headache at this time.

## 2017-05-31 NOTE — ED Notes (Signed)
Pt refused cardiac monitoring, EKG, I-stat blood work and IV. Attempted to discuss plan of care with pt at different times, pt stated each time "I don' know why I need that. I just had a seizure.  I just need something for a headache so I can go home and sleep", notified EDP of pt refusal and EDP to bedside to discuss plan of care with pt

## 2017-05-31 NOTE — Discharge Instructions (Signed)
Never miss a dose of your seizure medication.  [place] law states that you are not allowed to drive if you have had a seizure in the prior six months.

## 2017-07-06 ENCOUNTER — Ambulatory Visit (INDEPENDENT_AMBULATORY_CARE_PROVIDER_SITE_OTHER): Payer: Medicaid Other | Admitting: Family Medicine

## 2017-07-06 ENCOUNTER — Other Ambulatory Visit: Payer: Self-pay

## 2017-07-06 ENCOUNTER — Encounter: Payer: Self-pay | Admitting: Family Medicine

## 2017-07-06 VITALS — BP 130/80 | HR 67 | Temp 98.4°F | Resp 16 | Ht 62.0 in | Wt 253.2 lb

## 2017-07-06 DIAGNOSIS — J020 Streptococcal pharyngitis: Secondary | ICD-10-CM

## 2017-07-06 LAB — POCT RAPID STREP A (OFFICE): RAPID STREP A SCREEN: NEGATIVE

## 2017-07-06 MED ORDER — AZITHROMYCIN 250 MG PO TABS: ORAL_TABLET | ORAL | 0 refills | Status: DC

## 2017-07-06 MED ORDER — IBUPROFEN 800 MG PO TABS: 800.0000 mg | ORAL_TABLET | Freq: Three times a day (TID) | ORAL | 0 refills | Status: DC | PRN

## 2017-07-06 NOTE — Patient Instructions (Signed)
Note for work for today and tomorrow

## 2017-07-06 NOTE — Progress Notes (Signed)
Patient ID: Alexandra Garrett, female    DOB: August 17, 1985, 32 y.o.   MRN: [id number]  Chief Complaint  Patient presents with  . Sore Throat    Allergies Patient has no known allergies.  Subjective:   Alexandra Garrett is a 32 y.o. female who presents to [facility] today.  HPI [name] presents today for evaluation of a sore throat.  She reports she has had several exposures to strep throat and is subsequently developed a sore throat.  She reports that over the past couple days she has  noticed white spots on the back of her throat and her tonsils.  She reports that it hurts to swallow.  She reports the glands in her neck have been sore.  She denies any fevers.  She reports that she has felt like she is sick.  She is able to eat and drink well.  She has tried Chloraseptic throat spray, Aleve, and Tylenol but her symptoms have not gotten any better.  She reports she was concerned that she has strep throat.  She has had some postnasal drip and drainage down the back of her throat due to her allergies.  She has had a mild cough.  She denies any shortness of breath.  Denies any nausea, vomiting, or abdominal pain.  Has not had any rashes.  She reports that during the past 4 days her symptoms have gotten worse and she has not gone to work today because of her symptoms.  She reports it hurts to swallow.  Denies any hemoptysis.  Is able to swallow food but just has pain.  Reports that she did get some mild relief with Aleve but then her symptoms returned.   Past Medical History:  Diagnosis Date  . Anemia   . History of medication noncompliance   . Seizures (HCC)     No past surgical history on file.  Family History  Problem Relation Age of Onset  . Arthritis Mother   . Depression Mother   . Hypertension Mother   . Breast cancer Mother        lymph nodes  . Heart disease Father   . Arthritis Maternal Grandmother   . Cancer Maternal Grandmother        breast  . Diabetes  Maternal Grandmother   . Cancer Maternal Grandfather        lung  . Stroke Paternal Grandmother   . Heart disease Paternal Grandfather      Social History   Socioeconomic History  . Marital status: Divorced    Spouse name: Not on file  . Number of children: Not on file  . Years of education: Not on file  . Highest education level: Not on file  Occupational History  . Not on file  Social Needs  . Financial resource strain: Not on file  . Food insecurity:    Worry: Not on file    Inability: Not on file  . Transportation needs:    Medical: Not on file    Non-medical: Not on file  Tobacco Use  . Smoking status: Never Smoker  . Smokeless tobacco: Never Used  Substance and Sexual Activity  . Alcohol use: No    Alcohol/week: 0.0 oz  . Drug use: No  . Sexual activity: Not Currently    Birth control/protection: IUD  Lifestyle  . Physical activity:    Days per week: 0 days    Minutes per session: 0 min  . Stress: Very  much  Relationships  . Social connections:    Talks on phone: Not on file    Gets together: Not on file    Attends religious service: Not on file    Active member of club or organization: Not on file    Attends meetings of clubs or organizations: Not on file    Relationship status: Not on file  Other Topics Concern  . 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   Has two children, 2 boys   Divorced.    Eats all food groups.   Wears seatbelt.    Attends church.     Review of Systems  Constitutional: Positive for chills and fatigue. Negative for fever and unexpected weight change.  HENT: Positive for congestion, sneezing and sore throat. Negative for ear pain and voice change.   Respiratory: Positive for cough.   Cardiovascular: Negative for chest pain, palpitations and leg swelling.  Gastrointestinal: Negative for abdominal pain, diarrhea and nausea.    Current Outpatient Medications on File Prior to Visit  Medication Sig  Dispense Refill  . levETIRAcetam (KEPPRA) 500 MG tablet Take 1 tablet (500 mg total) by mouth 2 (two) times daily. 60 tablet 3  . levonorgestrel (MIRENA) 20 MCG/24HR IUD 1 each by Intrauterine route once.    . metroNIDAZOLE (METROGEL) 1 % gel Apply topically daily. 45 g 0  . escitalopram (LEXAPRO) 10 MG tablet Take 1 tablet (10 mg total) by mouth daily. (Patient not taking: Reported on 07/06/2017) 90 tablet 1  . Vitamin D, Ergocalciferol, (DRISDOL) 50000 units CAPS capsule Take 1 capsule (50,000 Units total) by mouth every 7 (seven) days. (Patient not taking: Reported on 07/06/2017) 12 capsule 0   No current facility-administered medications on file prior to visit.     Objective:   BP 130/80 (BP Location: Left Arm, Patient Position: Sitting, Cuff Size: Normal)   Pulse 67   Temp 98.4 F (36.9 C) (Temporal)   Resp 16   Ht  (1.575 m)   Wt 253 lb 4 oz (114.9 kg)   SpO2 98%   BMI 46.32 kg/m   Physical Exam  Constitutional: She is oriented to person, place, and time. She appears well-developed and well-nourished. No distress.  HENT:  Head: Normocephalic and atraumatic.  Mouth/Throat: Uvula is midline and mucous membranes are normal. No oral lesions. No uvula swelling. Oropharyngeal exudate and posterior oropharyngeal erythema present. No posterior oropharyngeal edema or tonsillar abscesses. Tonsils are 1+ on the right. Tonsils are 1+ on the left. Tonsillar exudate.  Eyes: Pupils are equal, round, and reactive to light. EOM are normal.  Neck: Normal range of motion. Neck supple.  Tender submandibular glands, slightly enlarged.   Cardiovascular: Normal rate and regular rhythm.  No murmur heard. Pulmonary/Chest: Effort normal and breath sounds normal. She has no wheezes.  Abdominal: Soft. Bowel sounds are normal.  Lymphadenopathy:    She has no cervical adenopathy.  Neurological: She is alert and oriented to person, place, and time.  Skin: Skin is warm and dry.  Psychiatric: She has  a normal mood and affect. Her behavior is normal.     Assessment and Plan    1. Pharyngitis due to Streptococcus species Treat for strep pharyngitis at this time.  Patient has had strep contacts and is symptomatic at this time with multiple criteria. - POCT rapid strep A, or sending for culture secondary to cost. - azithromycin (ZITHROMAX Z-PAK) 250 MG tablet; Take  two pills for the first day and one pill a day for the next four days.  Dispense: 6 each; Refill: 0 - ibuprofen (ADVIL,MOTRIN) 800 MG tablet; Take 1 tablet (800 mg total) by mouth every 8 (eight) hours as needed.  Dispense: 30 tablet; Refill: 0 -Counseled regarding worrisome signs and symptoms and if those develop to call, return to clinic, or go to the emergency department. Discussed risks of cardiovascular thrombotic events related to NSAIDS. Discussed increased risk of AMI and CVA. Discussed risk of serious GI adverse events including bleeding, ulcers, and perforation. Patient understands risks of this medication.  -Keep scheduled follow-up. Return if symptoms worsen or fail to improve. Aliene Beams, MD 07/06/2017

## 2017-07-21 ENCOUNTER — Ambulatory Visit: Payer: Medicaid Other | Admitting: Family Medicine

## 2017-08-14 ENCOUNTER — Encounter: Payer: Self-pay | Admitting: Family Medicine

## 2017-08-22 ENCOUNTER — Other Ambulatory Visit: Payer: Self-pay | Admitting: Family Medicine

## 2017-09-20 ENCOUNTER — Other Ambulatory Visit: Payer: Self-pay | Admitting: Family Medicine

## 2017-09-20 DIAGNOSIS — L719 Rosacea, unspecified: Secondary | ICD-10-CM

## 2017-09-29 DIAGNOSIS — R569 Unspecified convulsions: Secondary | ICD-10-CM | POA: Diagnosis not present

## 2017-10-20 ENCOUNTER — Telehealth: Payer: Self-pay | Admitting: Family Medicine

## 2017-10-20 NOTE — Telephone Encounter (Signed)
Pt LVM for the nurse to call her back regarding RX

## 2017-10-20 NOTE — Telephone Encounter (Signed)
Called patient to discuss message. No answer. Left vm requesting call back tomorrow since our phones have stopped ringing for the day.

## 2017-10-21 ENCOUNTER — Other Ambulatory Visit: Payer: Self-pay

## 2017-10-21 MED ORDER — LEVETIRACETAM ER 500 MG PO TB24: 1000.0000 mg | ORAL_TABLET | Freq: Every day | ORAL | 0 refills | Status: DC

## 2017-10-21 NOTE — Telephone Encounter (Signed)
Pt wants a refill on her Medication for seizures. She wants thru the end of the year, But I advised she would need an appointment, she sees a new Dr in Late Sept.--please call in to [facility] Sheralyn Boatman([place])

## 2017-10-21 NOTE — Telephone Encounter (Signed)
Medication refilled for 1 month

## 2017-11-26 DIAGNOSIS — Z131 Encounter for screening for diabetes mellitus: Secondary | ICD-10-CM | POA: Diagnosis not present

## 2017-11-26 DIAGNOSIS — E559 Vitamin D deficiency, unspecified: Secondary | ICD-10-CM | POA: Diagnosis not present

## 2017-11-26 DIAGNOSIS — G5600 Carpal tunnel syndrome, unspecified upper limb: Secondary | ICD-10-CM | POA: Diagnosis not present

## 2017-11-26 DIAGNOSIS — R5383 Other fatigue: Secondary | ICD-10-CM | POA: Diagnosis not present

## 2017-11-26 DIAGNOSIS — R569 Unspecified convulsions: Secondary | ICD-10-CM | POA: Diagnosis not present

## 2017-11-26 DIAGNOSIS — Z13220 Encounter for screening for lipoid disorders: Secondary | ICD-10-CM | POA: Diagnosis not present

## 2018-02-11 ENCOUNTER — Emergency Department (HOSPITAL_COMMUNITY)
Admission: EM | Admit: 2018-02-11 | Discharge: 2018-02-11 | Disposition: A | Discharge status: Home / Self Care | Payer: Medicaid Other | Attending: Emergency Medicine | Admitting: Emergency Medicine

## 2018-02-11 ENCOUNTER — Other Ambulatory Visit: Payer: Self-pay

## 2018-02-11 ENCOUNTER — Encounter (HOSPITAL_COMMUNITY): Payer: Self-pay | Admitting: Emergency Medicine

## 2018-02-11 DIAGNOSIS — M542 Cervicalgia: Secondary | ICD-10-CM | POA: Diagnosis present

## 2018-02-11 DIAGNOSIS — Z79899 Other long term (current) drug therapy: Secondary | ICD-10-CM | POA: Insufficient documentation

## 2018-02-11 DIAGNOSIS — M436 Torticollis: Secondary | ICD-10-CM | POA: Diagnosis not present

## 2018-02-11 MED ORDER — CYCLOBENZAPRINE HCL 10 MG PO TABS: 10.0000 mg | ORAL_TABLET | Freq: Three times a day (TID) | ORAL | 0 refills | Status: DC | PRN

## 2018-02-11 MED ORDER — DICLOFENAC SODIUM 75 MG PO TBEC: 75.0000 mg | DELAYED_RELEASE_TABLET | Freq: Two times a day (BID) | ORAL | 0 refills | Status: DC

## 2018-02-11 MED ORDER — HYDROCODONE-ACETAMINOPHEN 5-325 MG PO TABS: ORAL_TABLET | ORAL | 0 refills | Status: DC

## 2018-02-11 MED ORDER — KETOROLAC TROMETHAMINE 60 MG/2ML IM SOLN
60.0000 mg | Freq: Once | INTRAMUSCULAR | Status: AC
  Administered 2018-02-11: 60 mg via INTRAMUSCULAR
  Filled 2018-02-11: qty 2

## 2018-02-11 MED ORDER — DIAZEPAM 5 MG PO TABS
5.0000 mg | ORAL_TABLET | Freq: Once | ORAL | Status: AC
  Administered 2018-02-11: 5 mg via ORAL
  Filled 2018-02-11: qty 1

## 2018-02-11 NOTE — ED Triage Notes (Signed)
Patient reports neck pain that started yesterday, awakened with discomfort. Patient states the pain has become worse, radiates into her back and her right arm tingles.

## 2018-02-11 NOTE — Discharge Instructions (Addendum)
Alternate ice and heat to your neck.  Take the medication as directed.  Follow-up with your primary doctor for recheck, return to the ER for any worsening symptoms.

## 2018-02-11 NOTE — ED Provider Notes (Signed)
[facility] EMERGENCY DEPARTMENT Provider Note   CSN: [id number] Arrival date & time: 02/11/18  1519     History   Chief Complaint Chief Complaint  Patient presents with  . Neck Pain    HPI Alexandra Garrett is a 32 y.o. female.  HPI   Alexandra Garrett is a 32 y.o. female who presents to the Emergency Department complaining of neck pain for one day.  States that she woke with pain and stiffness of her right neck and pain associated with movement.  She also has some occasional tingling in her right arm.  She admits to sleeping on the sofa recently.  She denies known injury, headache, dizziness, chest pain and visual changes.  She applied a muscle cream last evening and took Advil with some relief.     Past Medical History:  Diagnosis Date  . Anemia   . History of medication noncompliance   . Seizures [facility])     Patient Active Problem List   Diagnosis Date Noted  . Anxiety 04/21/2017  . Low vitamin B12 level 04/21/2017  . Class 3 severe obesity due to excess calories with serious comorbidity and body mass index (BMI) of 45.0 to 49.9 in adult (HCC) 04/21/2017  . Vitamin D deficiency 04/21/2017  . Mixed hyperlipidemia 04/21/2017  . Encounter for IUD insertion 12/19/2015  . Seizures (HCC)     History reviewed. No pertinent surgical history.   OB History    Gravida      Para      Term      Preterm      AB      Living  2     SAB      TAB      Ectopic      Multiple      Live Births               Home Medications    Prior to Admission medications   Medication Sig Start Date End Date Taking? Authorizing Provider  azithromycin (ZITHROMAX Z-PAK) 250 MG tablet Take two pills for the first day and one pill a day for the next four days. 07/06/17   Aliene Beams, MD  escitalopram (LEXAPRO) 10 MG tablet Take 1 tablet (10 mg total) by mouth daily. Patient not taking: Reported on 07/06/2017 04/21/17   Aliene Beams, MD  ibuprofen (ADVIL,MOTRIN) 800 MG tablet  Take 1 tablet (800 mg total) by mouth every 8 (eight) hours as needed. 07/06/17   Aliene Beams, MD  levETIRAcetam (KEPPRA XR) 500 MG 24 hr tablet Take 2 tablets (1,000 mg total) by mouth at bedtime. 10/21/17   Aliene Beams, MD  levETIRAcetam (KEPPRA) 500 MG tablet Take 1 tablet (500 mg total) by mouth 2 (two) times daily. 01/05/16   Ward, Layla Maw, DO  levonorgestrel (MIRENA) 20 MCG/24HR IUD 1 each by Intrauterine route once.    [provider]  METROGEL 1 % gel APPLY TOPICALLY EVERY DAY 09/21/17   Aliene Beams, MD  Vitamin D, Ergocalciferol, (DRISDOL) 50000 units CAPS capsule Take 1 capsule (50,000 Units total) by mouth every 7 (seven) days. Patient not taking: Reported on 07/06/2017 04/21/17   Aliene Beams, MD    Family History Family History  Problem Relation Age of Onset  . Arthritis Mother   . Depression Mother   . Hypertension Mother   . Breast cancer Mother        lymph nodes  . Heart disease Father   . Arthritis Maternal  Grandmother   . Cancer Maternal Grandmother        breast  . Diabetes Maternal Grandmother   . Cancer Maternal Grandfather        lung  . Stroke Paternal Grandmother   . Heart disease Paternal Grandfather     Social History Social History   Tobacco Use  . Smoking status: Never Smoker  . Smokeless tobacco: Never Used  Substance Use Topics  . Alcohol use: No    Alcohol/week: 0.0 standard drinks  . Drug use: No     Allergies   Patient has no known allergies.   Review of Systems Review of Systems  Constitutional: Negative for chills and fever.  Eyes: Negative for visual disturbance.  Respiratory: Negative for chest tightness.   Cardiovascular: Negative for chest pain.  Gastrointestinal: Negative for abdominal pain and vomiting.  Genitourinary: Negative for difficulty urinating and dysuria.  Musculoskeletal: Positive for neck pain and neck stiffness. Negative for arthralgias and joint swelling.  Skin: Negative for color change and  wound.  Neurological: Negative for dizziness, syncope, facial asymmetry, weakness and light-headedness.     Physical Exam Updated Vital Signs BP (!) 115/58 (BP Location: Left Arm)   Pulse 68   Temp 98 F (36.7 C) (Oral)   Resp 20   Ht 5\' 2"  (1.575 m)   Wt 117.9 kg   SpO2 100%   BMI 47.55 kg/m   Physical Exam  Constitutional: She is oriented to person, place, and time. She appears well-nourished. No distress.  HENT:  Head: Atraumatic.  Mouth/Throat: Oropharynx is clear and moist.  Eyes: Pupils are equal, round, and reactive to light. Conjunctivae and EOM are normal.  Neck: Phonation normal. Muscular tenderness present. Decreased range of motion present. No edema and no erythema present.    ttp of the right cervical paraspinal muscles, trapezius and rhomboid muscles.  No edema or skin changes  Cardiovascular: Normal rate, regular rhythm and intact distal pulses.  Pulmonary/Chest: Effort normal and breath sounds normal. No respiratory distress. She exhibits no tenderness.  Musculoskeletal:       Cervical back: She exhibits no bony tenderness and normal pulse.  5/5 motor strength of the bilateral UE's   Neurological: She is alert and oriented to person, place, and time. She has normal strength. No sensory deficit. GCS eye subscore is 4. GCS verbal subscore is 5. GCS motor subscore is 6.  CN II-XII grossly intact, speech clear,   Skin: Skin is warm. Capillary refill takes less than 2 seconds. No rash noted.  Psychiatric: She has a normal mood and affect.  Nursing note and vitals reviewed.    ED Treatments / Results  Labs (all labs ordered are listed, but only abnormal results are displayed) Labs Reviewed - No data to display  EKG None  Radiology No results found.  Procedures Procedures (including critical care time)  Medications Ordered in ED Medications  ketorolac (TORADOL) injection 60 mg (has no administration in time range)  diazepam (VALIUM) tablet 5 mg (has  no administration in time range)     Initial Impression / Assessment and Plan / ED Course  I have reviewed the triage vital signs and the nursing notes.  Pertinent labs & imaging results that were available during my care of the patient were reviewed by me and considered in my medical decision making (see chart for details).     Pt with neck pain after sleeping on the sofa.  NV intact.  No focal neuro deficits on exam.  No hx of trauma.  Likely torticollis.    On recheck, pain has improved after IM toradol and valium.  She appears appropriate for d/c home and agrees to tx plan.    Final Clinical Impressions(s) / ED Diagnoses   Final diagnoses:  Torticollis, acute    ED Discharge Orders    None       Pauline Ausriplett, Henley Boettner, PA-C 02/12/18 0039    Loren RacerYelverton, David, MD 02/12/18 2328

## 2018-02-25 DIAGNOSIS — E559 Vitamin D deficiency, unspecified: Secondary | ICD-10-CM | POA: Diagnosis not present

## 2018-05-26 DIAGNOSIS — E785 Hyperlipidemia, unspecified: Secondary | ICD-10-CM | POA: Diagnosis not present

## 2018-05-26 DIAGNOSIS — R569 Unspecified convulsions: Secondary | ICD-10-CM | POA: Diagnosis not present

## 2018-05-26 DIAGNOSIS — E559 Vitamin D deficiency, unspecified: Secondary | ICD-10-CM | POA: Diagnosis not present

## 2018-06-18 DIAGNOSIS — R51 Headache: Secondary | ICD-10-CM | POA: Diagnosis not present

## 2018-06-18 DIAGNOSIS — R569 Unspecified convulsions: Secondary | ICD-10-CM | POA: Diagnosis not present

## 2018-08-16 DIAGNOSIS — R410 Disorientation, unspecified: Secondary | ICD-10-CM | POA: Diagnosis not present

## 2018-08-16 DIAGNOSIS — R569 Unspecified convulsions: Secondary | ICD-10-CM | POA: Diagnosis not present

## 2018-08-16 DIAGNOSIS — R404 Transient alteration of awareness: Secondary | ICD-10-CM | POA: Diagnosis not present

## 2018-08-31 ENCOUNTER — Ambulatory Visit (INDEPENDENT_AMBULATORY_CARE_PROVIDER_SITE_OTHER): Payer: Medicaid Other | Admitting: Internal Medicine

## 2018-09-14 DIAGNOSIS — Z131 Encounter for screening for diabetes mellitus: Secondary | ICD-10-CM | POA: Diagnosis not present

## 2018-09-14 DIAGNOSIS — E785 Hyperlipidemia, unspecified: Secondary | ICD-10-CM | POA: Diagnosis not present

## 2018-09-14 DIAGNOSIS — E669 Obesity, unspecified: Secondary | ICD-10-CM | POA: Diagnosis not present

## 2018-09-14 DIAGNOSIS — R5383 Other fatigue: Secondary | ICD-10-CM | POA: Diagnosis not present

## 2018-09-14 DIAGNOSIS — Z0001 Encounter for general adult medical examination with abnormal findings: Secondary | ICD-10-CM | POA: Diagnosis not present

## 2018-09-14 DIAGNOSIS — Z139 Encounter for screening, unspecified: Secondary | ICD-10-CM | POA: Diagnosis not present

## 2018-09-14 DIAGNOSIS — E559 Vitamin D deficiency, unspecified: Secondary | ICD-10-CM | POA: Diagnosis not present

## 2018-09-14 DIAGNOSIS — L719 Rosacea, unspecified: Secondary | ICD-10-CM | POA: Diagnosis not present

## 2018-11-11 ENCOUNTER — Ambulatory Visit: Payer: Medicaid Other | Admitting: Neurology

## 2018-11-13 ENCOUNTER — Other Ambulatory Visit (INDEPENDENT_AMBULATORY_CARE_PROVIDER_SITE_OTHER): Payer: Self-pay | Admitting: Nurse Practitioner

## 2018-11-13 DIAGNOSIS — G40909 Epilepsy, unspecified, not intractable, without status epilepticus: Secondary | ICD-10-CM

## 2018-11-18 ENCOUNTER — Encounter: Payer: Self-pay | Admitting: Neurology

## 2018-11-18 ENCOUNTER — Telehealth: Payer: Self-pay | Admitting: Neurology

## 2018-11-18 ENCOUNTER — Ambulatory Visit: Payer: Medicaid Other | Admitting: Neurology

## 2018-11-18 NOTE — Telephone Encounter (Signed)
The patient did not show for a new patient appointment today. 

## 2018-11-29 ENCOUNTER — Encounter (INDEPENDENT_AMBULATORY_CARE_PROVIDER_SITE_OTHER): Payer: Medicaid Other | Admitting: Internal Medicine

## 2018-12-14 ENCOUNTER — Other Ambulatory Visit: Payer: Self-pay | Admitting: *Deleted

## 2018-12-14 DIAGNOSIS — Z20822 Contact with and (suspected) exposure to covid-19: Secondary | ICD-10-CM

## 2018-12-14 DIAGNOSIS — Z20828 Contact with and (suspected) exposure to other viral communicable diseases: Secondary | ICD-10-CM | POA: Diagnosis not present

## 2018-12-16 LAB — NOVEL CORONAVIRUS, NAA: SARS-CoV-2, NAA: NOT DETECTED

## 2018-12-21 ENCOUNTER — Ambulatory Visit (INDEPENDENT_AMBULATORY_CARE_PROVIDER_SITE_OTHER): Payer: Medicaid Other | Admitting: Internal Medicine

## 2018-12-21 ENCOUNTER — Encounter (INDEPENDENT_AMBULATORY_CARE_PROVIDER_SITE_OTHER): Payer: Self-pay | Admitting: Internal Medicine

## 2018-12-21 DIAGNOSIS — J0100 Acute maxillary sinusitis, unspecified: Secondary | ICD-10-CM | POA: Diagnosis not present

## 2018-12-21 MED ORDER — PREDNISONE 20 MG PO TABS: 40.0000 mg | ORAL_TABLET | Freq: Every day | ORAL | 1 refills | Status: DC

## 2018-12-21 MED ORDER — AMOXICILLIN-POT CLAVULANATE 875-125 MG PO TABS
1.0000 | ORAL_TABLET | Freq: Two times a day (BID) | ORAL | 0 refills | Status: DC
Start: 2018-12-21 — End: 2019-08-23

## 2018-12-21 NOTE — Progress Notes (Signed)
Wellness Office Visit  Subjective:  Patient ID: Alexandra Garrett, female    DOB: March 02, 1986  Age: 33 y.o. MRN: [id number]  CC: This is an audio telemedicine visit with the permission of the patient who is at home and I am in my office. Her chief complaint is sinus congestion and discomfort for the last 2 days. HPI  She describes facial and nasal congestion, sneezing, postnasal drainage, sore throat, cough productive of green sputum.  She feels hot/cold but no objective fever.  She denies any significant wheezing or difficulties with breathing.  Approximately 1 week ago, she had COVID-19 testing which was negative when she did not have symptoms although other family members did have symptoms of nasal congestion. Past Medical History:  Diagnosis Date  . Anemia   . History of medication noncompliance   . Seizures ([place])       Family History  Problem Relation Age of Onset  . Arthritis Mother   . Depression Mother   . Hypertension Mother   . Breast cancer Mother        lymph nodes  . Heart disease Father   . Arthritis Maternal Grandmother   . Cancer Maternal Grandmother        breast  . Diabetes Maternal Grandmother   . Cancer Maternal Grandfather        lung  . Stroke Paternal Grandmother   . 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   Has two children, 2 boys   Divorced.    Eats all food groups.   Wears seatbelt.    Attends church.      Current Meds  Medication Sig  . escitalopram (LEXAPRO) 10 MG tablet Take 1 tablet (10 mg total) by mouth daily.  Marland Kitchen levETIRAcetam (KEPPRA XR) 500 MG 24 hr tablet TAKE 2 TABLETS BY MOUTH EVERY EVENING  . levETIRAcetam (KEPPRA) 500 MG tablet Take 1 tablet (500 mg total) by mouth 2 (two) times daily.  Marland Kitchen levonorgestrel (MIRENA) 20 MCG/24HR IUD 1 each by Intrauterine route once.  . Vitamin D, Ergocalciferol, (DRISDOL) 50000 units CAPS capsule Take 1 capsule (50,000  Units total) by mouth every 7 (seven) days.       Objective:   Today's Vitals: There were no vitals taken for this visit. Vitals with BMI 12/21/2018 02/11/2018 07/06/2017  Height (No Data) 5\' 2"  5\' 2"   Weight (No Data) 260 lbs 253 lbs 4 oz  BMI - 96.22 29.79  Systolic (No Data) 892 119  Diastolic (No Data) 58 80  Pulse - 68 67     Physical Exam   She is alert and orientated on the phone and her speech is normal.  She did cough while she was speaking to me.  She does appear to have frontal and maxillary sinus tenderness.    Assessment   1. Acute non-recurrent maxillary sinusitis       Tests ordered No orders of the defined types were placed in this encounter.    Plan: 1. I have sent prescription for antibiotics and oral steroids and I have explained possible side effects of steroids.  She will take these medications and if she does not improve, she will let me know. 2. The phone call lasted 6 minutes and 51 seconds.   Meds ordered this encounter  Medications  . amoxicillin-clavulanate (AUGMENTIN) 875-125 MG tablet    Sig: Take 1 tablet by mouth  2 (two) times daily.    Dispense:  20 tablet    Refill:  0  . predniSONE (DELTASONE) 20 MG tablet    Sig: Take 2 tablets (40 mg total) by mouth daily with breakfast.    Dispense:  10 tablet    Refill:  1    Christpoher Sievers Normajean Glasgow, MD

## 2018-12-22 ENCOUNTER — Telehealth (INDEPENDENT_AMBULATORY_CARE_PROVIDER_SITE_OTHER): Payer: Self-pay | Admitting: Internal Medicine

## 2018-12-22 ENCOUNTER — Encounter (INDEPENDENT_AMBULATORY_CARE_PROVIDER_SITE_OTHER): Payer: Self-pay | Admitting: Internal Medicine

## 2018-12-23 NOTE — Telephone Encounter (Signed)
done

## 2018-12-28 ENCOUNTER — Telehealth: Payer: Self-pay | Admitting: Neurology

## 2018-12-28 ENCOUNTER — Ambulatory Visit: Payer: Medicaid Other | Admitting: Neurology

## 2018-12-28 NOTE — Telephone Encounter (Signed)
This is the second new patient no-show for this patient, we will discharge from practice. 

## 2019-01-04 DIAGNOSIS — R569 Unspecified convulsions: Secondary | ICD-10-CM | POA: Diagnosis not present

## 2019-01-04 DIAGNOSIS — R404 Transient alteration of awareness: Secondary | ICD-10-CM | POA: Diagnosis not present

## 2019-01-04 DIAGNOSIS — R410 Disorientation, unspecified: Secondary | ICD-10-CM | POA: Diagnosis not present

## 2019-02-04 DIAGNOSIS — H5203 Hypermetropia, bilateral: Secondary | ICD-10-CM | POA: Diagnosis not present

## 2019-02-08 DIAGNOSIS — H5213 Myopia, bilateral: Secondary | ICD-10-CM | POA: Diagnosis not present

## 2019-02-11 ENCOUNTER — Other Ambulatory Visit (INDEPENDENT_AMBULATORY_CARE_PROVIDER_SITE_OTHER): Payer: Self-pay | Admitting: Internal Medicine

## 2019-02-11 DIAGNOSIS — G40909 Epilepsy, unspecified, not intractable, without status epilepticus: Secondary | ICD-10-CM

## 2019-02-24 DIAGNOSIS — H5203 Hypermetropia, bilateral: Secondary | ICD-10-CM | POA: Diagnosis not present

## 2019-02-24 DIAGNOSIS — H52223 Regular astigmatism, bilateral: Secondary | ICD-10-CM | POA: Diagnosis not present

## 2019-05-11 ENCOUNTER — Other Ambulatory Visit (INDEPENDENT_AMBULATORY_CARE_PROVIDER_SITE_OTHER): Payer: Self-pay | Admitting: Internal Medicine

## 2019-05-11 DIAGNOSIS — G40909 Epilepsy, unspecified, not intractable, without status epilepticus: Secondary | ICD-10-CM

## 2019-06-20 ENCOUNTER — Other Ambulatory Visit: Payer: Self-pay

## 2019-06-20 ENCOUNTER — Ambulatory Visit
Admission: EM | Admit: 2019-06-20 | Discharge: 2019-06-20 | Disposition: A | Discharge status: Home / Self Care | Payer: Medicaid Other | Attending: Family Medicine | Admitting: Family Medicine

## 2019-06-20 DIAGNOSIS — U071 COVID-19: Secondary | ICD-10-CM

## 2019-06-20 DIAGNOSIS — R059 Cough, unspecified: Secondary | ICD-10-CM

## 2019-06-20 DIAGNOSIS — Z20822 Contact with and (suspected) exposure to covid-19: Secondary | ICD-10-CM

## 2019-06-20 DIAGNOSIS — R05 Cough: Secondary | ICD-10-CM

## 2019-06-20 LAB — POC SARS CORONAVIRUS 2 AG -  ED: SARS Coronavirus 2 Ag: POSITIVE — AB

## 2019-06-20 MED ORDER — CETIRIZINE HCL 10 MG PO TABS: 10.0000 mg | ORAL_TABLET | Freq: Every day | ORAL | 11 refills | Status: DC

## 2019-06-20 MED ORDER — BENZONATATE 100 MG PO CAPS: 100.0000 mg | ORAL_CAPSULE | Freq: Three times a day (TID) | ORAL | 0 refills | Status: DC | PRN

## 2019-06-20 MED ORDER — IPRATROPIUM BROMIDE 0.03 % NA SOLN: 2.0000 | Freq: Three times a day (TID) | NASAL | 0 refills | Status: DC

## 2019-06-20 NOTE — ED Provider Notes (Signed)
[facility]    CSN: [id number] Arrival date & time: 06/20/19  0901      History   Chief Complaint Chief Complaint  Patient presents with  . Cough    HPI Alexandra Garrett is a 34 y.o. female.   HPI  Patient presents today for evaluation of a cough and nasal congestion along with fatigue that began Saturday.  Patient endorses a recent exposure to someone positive for COVID-19 over 7 days ago.  Patient reports 5 days ago developed an acute onset of fatigue, headache, nasal congestion, and altered taste.  She was recently notified that individuals in which she has had close contact with her church has recently tested positive for COVID-19.  She denies fever, nausea, vomiting, diarrhea.  Endorses mild chest congestion and tightness which has improved from yesterday.  She has a persistent nonproductive cough which she is taking Mucinex for without significant improvement.  Denies any history of asthma or chronic recurrent bronchitis.  Patient is a non-smoker, without previous smoking history of exposure.  Moderate risk for complications related to COVID-19 due to morbid obesity, hyperlipidemia, and history of low vitamin D level.  Past Medical History:  Diagnosis Date  . Anemia   . History of medication noncompliance   . Seizures [facility])     Patient Active Problem List   Diagnosis Date Noted  . Anxiety 04/21/2017  . Low vitamin B12 level 04/21/2017  . Class 3 severe obesity due to excess calories with serious comorbidity and body mass index (BMI) of 45.0 to 49.9 in adult ([place]) 04/21/2017  . Vitamin D deficiency 04/21/2017  . Mixed hyperlipidemia 04/21/2017  . Encounter for IUD insertion 12/19/2015  . Seizures ([place])     History reviewed. No pertinent surgical history.  OB History    Gravida      Para      Term      Preterm      AB      Living  2     SAB      TAB      Ectopic      Multiple      Live Births               Home Medications     Prior to Admission medications   Medication Sig Start Date End Date Taking? Authorizing Provider  amoxicillin-clavulanate (AUGMENTIN) 875-125 MG tablet Take 1 tablet by mouth 2 (two) times daily. 12/21/18   Hurshel Party C, MD  escitalopram (LEXAPRO) 10 MG tablet Take 1 tablet (10 mg total) by mouth daily. 04/21/17   Caren Macadam, MD  levETIRAcetam (KEPPRA XR) 500 MG 24 hr tablet TAKE 2 TABLETS BY MOUTH EVERY EVENING 05/11/19 08/09/19  Ailene Ards, NP  levETIRAcetam (KEPPRA) 500 MG tablet Take 1 tablet (500 mg total) by mouth 2 (two) times daily. 01/05/16   Ward, Delice Bison, DO  levonorgestrel (MIRENA) 20 MCG/24HR IUD 1 each by Intrauterine route once.    [provider]  predniSONE (DELTASONE) 20 MG tablet Take 2 tablets (40 mg total) by mouth daily with breakfast. 12/21/18   Doree Albee, MD  Vitamin D, Ergocalciferol, (DRISDOL) 50000 units CAPS capsule Take 1 capsule (50,000 Units total) by mouth every 7 (seven) days. 04/21/17   Caren Macadam, MD  levETIRAcetam (KEPPRA XR) 500 MG 24 hr tablet Take 2 tablets (1,000 mg total) by mouth at bedtime. 10/21/17   Caren Macadam, MD    Family History Family History  Problem Relation Age of Onset  . Arthritis Mother   . Depression Mother   . Hypertension Mother   . Breast cancer Mother        lymph nodes  . Heart disease Father   . Arthritis Maternal Grandmother   . Cancer Maternal Grandmother        breast  . Diabetes Maternal Grandmother   . Cancer Maternal Grandfather        lung  . Stroke Paternal Grandmother   . Heart disease Paternal Grandfather     Social History Social History   Tobacco Use  . Smoking status: Never Smoker  . Smokeless tobacco: Never Used  Substance Use Topics  . Alcohol use: No    Alcohol/week: 0.0 standard drinks  . Drug use: No     Allergies   Patient has no known allergies.   Review of Systems Review of Systems Pertinent negatives listed in HPI  Physical Exam Triage Vital  Signs ED Triage Vitals  Enc Vitals Group     BP 06/20/19 0915 115/84     Pulse Rate 06/20/19 0915 78     Resp 06/20/19 0915 20     Temp 06/20/19 0915 98.6 F (37 C)     Temp src --      SpO2 06/20/19 0915 98 %     Weight --      Height --      Head Circumference --      Peak Flow --      Pain Score 06/20/19 0914 0     Pain Loc --      Pain Edu? --      Excl. in GC? --    No data found.  Updated Vital Signs BP 115/84   Pulse 78   Temp 98.6 F (37 C)   Resp 20   SpO2 98%   Visual Acuity Right Eye Distance:   Left Eye Distance:   Bilateral Distance:    Right Eye Near:   Left Eye Near:    Bilateral Near:     Physical Exam Constitutional:      Appearance: She is ill-appearing.  HENT:     Right Ear: Tympanic membrane normal.     Left Ear: Tympanic membrane normal.     Nose: Mucosal edema and congestion present.  Cardiovascular:     Rate and Rhythm: Normal rate and regular rhythm.  Pulmonary:     Effort: Pulmonary effort is normal.     Breath sounds: Normal breath sounds and air entry.  Skin:    General: Skin is warm and dry.  Neurological:     General: No focal deficit present.     Mental Status: She is alert and oriented to person, place, and time.  Psychiatric:        Attention and Perception: Attention normal.        Mood and Affect: Mood normal.    UC Treatments / Results  Labs (all labs ordered are listed, but only abnormal results are displayed) Labs Reviewed  POC SARS CORONAVIRUS 2 AG -  ED    EKG   Radiology No results found.  Procedures Procedures (including critical care time)  Medications Ordered in UC Medications - No data to display  Initial Impression / Assessment and Plan / UC Course  I have reviewed the triage vital signs and the nursing notes.  Pertinent labs & imaging results that were available during my care of the patient were reviewed by me  and considered in my medical decision making (see chart for details).      COVID-19 virus infection -Start Vitamin D 5,000 IU daily, Vitamin C 500 mg twice daily, and  Zinc 50 mg daily -Cetirizine 10 mg once daily. -Atrovent nasal spray 2 sprays in each nares TID PRN for congestion   Cough -Benzonatate 100-200 mg TID PRN   Final Clinical Impressions(s) / UC Diagnoses   Final diagnoses:  COVID-19 virus infection  Cough     Discharge Instructions     Recommendation management of viral illness include: Vitamin D 5,000 IU daily Vitamin C 500 mg twice daily Zinc 50 mg daily      ED Prescriptions    Medication Sig Dispense Auth. Provider   benzonatate (TESSALON) 100 MG capsule Take 1-2 capsules (100-200 mg total) by mouth 3 (three) times daily as needed for cough. 60 capsule Bing Neighbors, FNP   ipratropium (ATROVENT) 0.03 % nasal spray Place 2 sprays into both nostrils 3 (three) times daily. 30 mL Bing Neighbors, FNP   cetirizine (ZYRTEC) 10 MG tablet Take 1 tablet (10 mg total) by mouth daily. 30 tablet Bing Neighbors, FNP     PDMP not reviewed this encounter.   Bing Neighbors, FNP 06/22/19 [phone]

## 2019-06-20 NOTE — ED Triage Notes (Signed)
Pt presents with c/o cough, nasal congestion and fatigue that began saturday. Pt has had positive covid exposure last week

## 2019-06-20 NOTE — Discharge Instructions (Addendum)
Recommendation management of viral illness include: Vitamin D 5,000 IU daily Vitamin C 500 mg twice daily Zinc 50 mg daily  

## 2019-06-21 ENCOUNTER — Telehealth: Payer: Self-pay | Admitting: Unknown Physician Specialty

## 2019-06-21 NOTE — Telephone Encounter (Signed)
Left a mab mychart message

## 2019-06-21 NOTE — Telephone Encounter (Signed)
Called to discuss with patient about Covid symptoms and the use of bamlanivimab, a monoclonal antibody infusion for those with mild to moderate Covid symptoms and at a high risk of hospitalization.  Pt is qualified for this infusion at the [facility] due to BMI>35   Message left to call back  

## 2019-06-21 NOTE — Telephone Encounter (Signed)
  I connected by phone with Alexandra Garrett on 06/21/2019 at 5:21 PM to discuss the potential use of an new treatment for mild to moderate COVID-19 viral infection in non-hospitalized patients.  This patient is a 34 y.o. female that meets the FDA criteria for Emergency Use Authorization of bamlanivimab/etesevimab or casirivimab/imdevimab.  Has a (+) direct SARS-CoV-2 viral test result  Has mild or moderate COVID-19   Is ? 35 years of age and weighs ? 40 kg  Is NOT hospitalized due to COVID-19  Is NOT requiring oxygen therapy or requiring an increase in baseline oxygen flow rate due to COVID-19  Is within 10 days of symptom onset  Has at least one of the high risk factor(s) for progression to severe COVID-19 and/or hospitalization as defined in EUA.  Specific high risk criteria : BMI >/= 35   I have spoken and communicated the following to the patient or parent/caregiver:  1. FDA has authorized the emergency use of bamlanivimab/etesevimab and casirivimab\imdevimab for the treatment of mild to moderate COVID-19 in adults and pediatric patients with positive results of direct SARS-CoV-2 viral testing who are 73 years of age and older weighing at least 40 kg, and who are at high risk for progressing to severe COVID-19 and/or hospitalization.  2. The significant known and potential risks and benefits of bamlanivimab/etesevimab and casirivimab\imdevimab, and the extent to which such potential risks and benefits are unknown.  3. Information on available alternative treatments and the risks and benefits of those alternatives, including clinical trials.  4. Patients treated with bamlanivimab/etesevimab and casirivimab\imdevimab should continue to self-isolate and use infection control measures (e.g., wear mask, isolate, social distance, avoid sharing personal items, clean and disinfect "high touch" surfaces, and frequent handwashing) according to CDC guidelines.   5. The patient or  parent/caregiver has the option to accept or refuse bamlanivimab/etesevimab or casirivimab\imdevimab .  After reviewing this information with the patient, she would like to think about it,Richrd Kuzniar 06/21/2019 5:21 PM

## 2019-08-01 ENCOUNTER — Ambulatory Visit (INDEPENDENT_AMBULATORY_CARE_PROVIDER_SITE_OTHER): Payer: Medicaid Other | Admitting: Internal Medicine

## 2019-08-11 ENCOUNTER — Other Ambulatory Visit (INDEPENDENT_AMBULATORY_CARE_PROVIDER_SITE_OTHER): Payer: Self-pay | Admitting: Nurse Practitioner

## 2019-08-11 DIAGNOSIS — G40909 Epilepsy, unspecified, not intractable, without status epilepticus: Secondary | ICD-10-CM

## 2019-08-23 ENCOUNTER — Encounter (INDEPENDENT_AMBULATORY_CARE_PROVIDER_SITE_OTHER): Payer: Self-pay | Admitting: Internal Medicine

## 2019-08-23 ENCOUNTER — Ambulatory Visit (INDEPENDENT_AMBULATORY_CARE_PROVIDER_SITE_OTHER): Payer: Medicaid Other | Admitting: Internal Medicine

## 2019-08-23 ENCOUNTER — Other Ambulatory Visit: Payer: Self-pay

## 2019-08-23 DIAGNOSIS — E559 Vitamin D deficiency, unspecified: Secondary | ICD-10-CM | POA: Diagnosis not present

## 2019-08-23 DIAGNOSIS — E782 Mixed hyperlipidemia: Secondary | ICD-10-CM

## 2019-08-23 DIAGNOSIS — R5381 Other malaise: Secondary | ICD-10-CM | POA: Diagnosis not present

## 2019-08-23 DIAGNOSIS — R5383 Other fatigue: Secondary | ICD-10-CM

## 2019-08-23 DIAGNOSIS — E282 Polycystic ovarian syndrome: Secondary | ICD-10-CM | POA: Diagnosis not present

## 2019-08-23 DIAGNOSIS — R569 Unspecified convulsions: Secondary | ICD-10-CM | POA: Diagnosis not present

## 2019-08-23 MED ORDER — LEVETIRACETAM 500 MG PO TABS: 500.0000 mg | ORAL_TABLET | Freq: Two times a day (BID) | ORAL | 3 refills | Status: DC

## 2019-08-23 NOTE — Progress Notes (Signed)
Metrics: Intervention Frequency ACO  Documented Smoking Status Yearly  Screened one or more times in 24 months  Cessation Counseling or  Active cessation medication Past 24 months  Past 24 months   Guideline developer: UpToDate (See UpToDate for funding source) Date Released: 2014       Wellness Office Visit  Subjective:  Patient ID: Alexandra Garrett, female    DOB: [birth year]  Age: 34 y.o. MRN: [id number]  CC: This lady comes in after a long hiatus for follow-up on her seizure disorder, morbid obesity, vitamin D deficiency. HPI  She also has a history of hyperlipidemia with low HDL levels.  She told me today that her father had a myocardial infarction at the age of less than 12 years old. She and her new husband have been trying to lose weight for the last 1 month.  She is using apple cider vinegar as a tool.  She is drinking lots of water and trying to eat healthier.  She feels that she has lost weight in the last 1 month. She did have a history of vitamin D deficiency but has not been consistent with taking vitamin D supplementation. In the past, her free T3 levels were also on the low end. On closer questioning, she describes acne and hirsutism on her legs especially. Past Medical History:  Diagnosis Date  . Anemia   . History of medication noncompliance   . Seizures ([facility])    Since age 1 yrs   History reviewed. No pertinent surgical history.   Family History  Problem Relation Age of Onset  . Arthritis Mother   . Depression Mother   . Hypertension Mother   . Breast cancer Mother        lymph nodes  . Heart disease Father   . Arthritis Maternal Grandmother   . Cancer Maternal Grandmother        breast  . Diabetes Maternal Grandmother   . Cancer Maternal Grandfather        lung  . Stroke Paternal Grandmother   . Heart disease Paternal Grandfather     Social History   Social History Narrative   Works at [employer].   Married April 2020,second  marriage.Lives with husband and 2 kids.      Social History   Tobacco Use  . Smoking status: Never Smoker  . Smokeless tobacco: Never Used  Substance Use Topics  . Alcohol use: No    Alcohol/week: 0.0 standard drinks    Current Meds  Medication Sig  . cetirizine (ZYRTEC) 10 MG tablet Take 1 tablet (10 mg total) by mouth daily.  Marland Kitchen levETIRAcetam (KEPPRA) 500 MG tablet Take 1 tablet (500 mg total) by mouth 2 (two) times daily.  Marland Kitchen levonorgestrel (MIRENA) 20 MCG/24HR IUD 1 each by Intrauterine route once.  . [DISCONTINUED] levETIRAcetam (KEPPRA) 500 MG tablet Take 1 tablet (500 mg total) by mouth 2 (two) times daily.  . [DISCONTINUED] levETIRAcetam (KEPPRA) 500 MG tablet Take 1 tablet (500 mg total) by mouth 2 (two) times daily.  . [DISCONTINUED] Vitamin D, Ergocalciferol, (DRISDOL) 50000 units CAPS capsule Take 1 capsule (50,000 Units total) by mouth every 7 (seven) days.       Depression screen [facility] 2/9 03/19/2017 02/12/2017  Decreased Interest 0 0  Down, Depressed, Hopeless 0 0  PHQ - 2 Score 0 0     Objective:   Today's Vitals: BP 125/80 (BP Location: Right Arm, Patient Position: Sitting, Cuff Size: Normal)   Pulse 72  Temp (!) 97.1 F (36.2 C) (Temporal)   Ht 5\' 2"  (1.575 m)   Wt 257 lb (116.6 kg)   SpO2 98%   BMI 47.01 kg/m  Vitals with BMI 08/23/2019 06/20/2019 12/21/2018  Height 5\' 2"  - (No Data)  Weight 257 lbs - (No Data)  BMI 89.38 - -  Systolic 101 751 (No Data)  Diastolic 80 84 (No Data)  Pulse 72 78 -     Physical Exam  She looks systemically well.  She remains morbidly obese.  Blood pressure is acceptable.  She is alert and orientated without any focal neurological signs.     Assessment   1. Morbid obesity ([place])   2. Vitamin D deficiency disease   3. Seizures ([place])   4. Mixed hyperlipidemia   5. PCOS (polycystic ovarian syndrome)   6. Malaise and fatigue       Tests ordered Orders Placed This Encounter  Procedures  . Cardio IQ Adv Lipid  and Inflamm Pnl  . Cardio IQ Insulin Resistance Panel with Score  . COMPLETE METABOLIC PANEL WITH GFR  . VITAMIN D 25 Hydroxy (Vit-D Deficiency, Fractures)  . T3, free  . T4  . TSH  . Luteinizing hormone  . Follicle stimulating hormone  . Testos,Total,Free and SHBG (Female)  . CBC     Plan: 1. Blood work is ordered. 2. I wonder if she actually has PCOS which would explain some of her symptoms and her morbid obesity. 3. I have refilled her Keppra today for her seizure disorder. 4. I will discuss all her blood results in detail in about 3 weeks time and I stressed to her the importance of becoming healthier especially with genetics that she has with early coronary artery disease in her father. 5. I spent 30 minutes with this patient reviewing her medical records and describing to her the importance of losing weight and becoming healthier.   Meds ordered this encounter  Medications  . DISCONTD: levETIRAcetam (KEPPRA) 500 MG tablet    Sig: Take 1 tablet (500 mg total) by mouth 2 (two) times daily.    Dispense:  60 tablet    Refill:  3  . levETIRAcetam (KEPPRA) 500 MG tablet    Sig: Take 1 tablet (500 mg total) by mouth 2 (two) times daily.    Dispense:  60 tablet    Refill:  3    Demarie Hyneman Luther Parody, MD

## 2019-08-27 LAB — CARDIO IQ ADV LIPID AND INFLAMM PNL
Apolipoprotein B: 89 mg/dL (ref <90)
Cholesterol: 154 mg/dL (ref <200)
HDL: 34 mg/dL — ABNORMAL LOW (ref >49)
LDL Cholesterol (Calc): 102 mg/dL (calc) — ABNORMAL HIGH (ref <100)
LDL Large: 4668 nmol/L — ABNORMAL LOW (ref >6729)
LDL Medium: 290 nmol/L — ABNORMAL HIGH (ref <215)
LDL Particle Number: 1126 nmol/L (ref <1138)
LDL Peak Size: 219.9 Angstrom — ABNORMAL LOW (ref >222.9)
LDL Small: 184 nmol/L — ABNORMAL HIGH (ref <142)
Lipoprotein (a): 14 nmol/L (ref <75)
Non-HDL Cholesterol (Calc): 120 mg/dL (calc) (ref <130)
PLAC: 101 nmol/min/mL (ref <124)
Total CHOL/HDL Ratio: 4.5 calc — ABNORMAL HIGH (ref <3.6)
Triglycerides: 90 mg/dL (ref <150)
hs-CRP: 8.1 mg/L — ABNORMAL HIGH (ref <1.0)

## 2019-08-27 LAB — COMPLETE METABOLIC PANEL WITH GFR
AG Ratio: 1.5 (calc) (ref 1.0–2.5)
ALT: 26 U/L (ref 6–29)
AST: 16 U/L (ref 10–30)
Albumin: 3.9 g/dL (ref 3.6–5.1)
Alkaline phosphatase (APISO): 61 U/L (ref 31–125)
BUN: 12 mg/dL (ref 7–25)
CO2: 22 mmol/L (ref 20–32)
Calcium: 8.7 mg/dL (ref 8.6–10.2)
Chloride: 108 mmol/L (ref 98–110)
Creat: 0.84 mg/dL (ref 0.50–1.10)
GFR, Est African American: 105 mL/min/{1.73_m2} (ref >=60)
GFR, Est Non African American: 91 mL/min/{1.73_m2} (ref >=60)
Globulin: 2.6 g/dL (calc) (ref 1.9–3.7)
Glucose, Bld: 91 mg/dL (ref 65–99)
Potassium: 4.2 mmol/L (ref 3.5–5.3)
Sodium: 140 mmol/L (ref 135–146)
Total Bilirubin: 0.8 mg/dL (ref 0.2–1.2)
Total Protein: 6.5 g/dL (ref 6.1–8.1)

## 2019-08-27 LAB — CBC
HCT: 42.1 % (ref 35.0–45.0)
Hemoglobin: 13.8 g/dL (ref 11.7–15.5)
MCH: 28.7 pg (ref 27.0–33.0)
MCHC: 32.8 g/dL (ref 32.0–36.0)
MCV: 87.5 fL (ref 80.0–100.0)
MPV: 10.3 fL (ref 7.5–12.5)
Platelets: 336 10*3/uL (ref 140–400)
RBC: 4.81 10*6/uL (ref 3.80–5.10)
RDW: 13.1 % (ref 11.0–15.0)
WBC: 6.4 10*3/uL (ref 3.8–10.8)

## 2019-08-27 LAB — VITAMIN D 25 HYDROXY (VIT D DEFICIENCY, FRACTURES): Vit D, 25-Hydroxy: 22 ng/mL — ABNORMAL LOW (ref 30–100)

## 2019-08-27 LAB — FOLLICLE STIMULATING HORMONE: FSH: 9.2 m[IU]/mL

## 2019-08-27 LAB — CARDIO IQ INSULIN RESISTANCE PANEL WITH SCORE

## 2019-08-27 LAB — TESTOS,TOTAL,FREE AND SHBG (FEMALE)
Free Testosterone: 2 pg/mL (ref 0.1–6.4)
Sex Hormone Binding: 21 nmol/L (ref 17–124)
Testosterone, Total, LC-MS-MS: 11 ng/dL (ref 2–45)

## 2019-08-27 LAB — T4: T4, Total: 7.7 ug/dL (ref 5.1–11.9)

## 2019-08-27 LAB — LUTEINIZING HORMONE: LH: 5.5 m[IU]/mL

## 2019-08-27 LAB — TSH: TSH: 1.27 mIU/L

## 2019-08-27 LAB — T3, FREE: T3, Free: 2.8 pg/mL (ref 2.3–4.2)

## 2019-09-20 ENCOUNTER — Other Ambulatory Visit: Payer: Self-pay

## 2019-09-20 ENCOUNTER — Ambulatory Visit (INDEPENDENT_AMBULATORY_CARE_PROVIDER_SITE_OTHER): Payer: Medicaid Other | Admitting: Internal Medicine

## 2019-09-20 ENCOUNTER — Encounter (INDEPENDENT_AMBULATORY_CARE_PROVIDER_SITE_OTHER): Payer: Self-pay | Admitting: Internal Medicine

## 2019-09-20 DIAGNOSIS — E782 Mixed hyperlipidemia: Secondary | ICD-10-CM

## 2019-09-20 DIAGNOSIS — R5381 Other malaise: Secondary | ICD-10-CM

## 2019-09-20 DIAGNOSIS — R5383 Other fatigue: Secondary | ICD-10-CM

## 2019-09-20 DIAGNOSIS — E559 Vitamin D deficiency, unspecified: Secondary | ICD-10-CM | POA: Diagnosis not present

## 2019-09-20 NOTE — Patient Instructions (Signed)
[facility] Dietary Recommendations for Weight Loss What to Avoid . Avoid added sugars o Often added sugar can be found in processed foods such as many condiments, dry cereals, cakes, cookies, chips, crisps, crackers, candies, sweetened drinks, etc.  o Read labels and AVOID/DECREASE use of foods with the following in their ingredient list: Sugar, fructose, high fructose corn syrup, sucrose, glucose, maltose, dextrose, molasses, cane sugar, brown sugar, any type of syrup, agave nectar, etc.   . Avoid snacking in between meals . Avoid foods made with flour o If you are going to eat food made with flour, choose those made with whole-grains; and, minimize your consumption as much as is tolerable . Avoid processed foods o These foods are generally stocked in the middle of the grocery store. Focus on shopping on the perimeter of the grocery.  . Avoid Meat  o We recommend following a plant-based diet at [facility]. Thus, we recommend avoiding meat as a general rule. Consider eating beans, legumes, eggs, and/or dairy products for regular protein sources o If you plan on eating meat limit to 4 ounces of meat at a time and choose lean options such as Fish, chicken, turkey. Avoid red meat intake such as pork and/or steak What to Include . Vegetables o GREEN LEAFY VEGETABLES: Kale, spinach, mustard greens, collard greens, cabbage, broccoli, etc. o OTHER: Asparagus, cauliflower, eggplant, carrots, peas, Brussel sprouts, tomatoes, bell peppers, zucchini, beets, cucumbers, etc. . Grains, seeds, and legumes o Beans: kidney beans, black eyed peas, garbanzo beans, black beans, pinto beans, etc. o Whole, unrefined grains: brown rice, barley, bulgur, oatmeal, etc. . Healthy fats  o Avoid highly processed fats such as vegetable oil o Examples of healthy fats: avocado, olives, virgin olive oil, dark chocolate (?72% Cocoa), nuts (peanuts, almonds, walnuts, cashews, pecans, etc.) . None to Low  Intake of Animal Sources of Protein o Meat sources: chicken, turkey, salmon, tuna. Limit to 4 ounces of meat at one time. o Consider limiting dairy sources, but when choosing dairy focus on: PLAIN Greek yogurt, cottage cheese, high-protein milk . Fruit o Choose berries  When to Eat . Intermittent Fasting: o Choosing not to eat for a specific time period, but DO FOCUS ON HYDRATION when fasting o Multiple Techniques: - Time Restricted Eating: eat 3 meals in a day, each meal lasting no more than 60 minutes, no snacks between meals - 16-18 hour fast: fast for 16 to 18 hours up to 7 days a week. Often suggested to start with 2-3 nonconsecutive days per week.  . Remember the time you sleep is counted as fasting.  . Examples of eating schedule: Fast from 7:00pm-11:00am. Eat between 11:00am-7:00pm.  - 24-hour fast: fast for 24 hours up to every other day. Often suggested to start with 1 day per week . Remember the time you sleep is counted as fasting . Examples of eating schedule:  o Eating day: eat 2-3 meals on your eating day. If doing 2 meals, each meal should last no more than 90 minutes. If doing 3 meals, each meal should last no more than 60 minutes. Finish last meal by 7:00pm. o Fasting day: Fast until 7:00pm.  o IF YOU FEEL UNWELL FOR ANY REASON/IN ANY WAY WHEN FASTING, STOP FASTING BY EATING A NUTRITIOUS SNACK OR LIGHT MEAL o ALWAYS FOCUS ON HYDRATION DURING FASTS - Acceptable Hydration sources: water, broths, tea/coffee (black tea/coffee is best but using a small amount of whole-fat dairy products in coffee/tea is acceptable).  -   Poor Hydration Sources: anything with sugar or artificial sweeteners added to it  These recommendations have been developed for patients that are actively receiving medical care from either Dr. Cherrie Franca or Sarah Gray, DNP, NP-C at [facility]. These recommendations are developed for patients with specific medical conditions and are not meant to be  distributed or used by others that are not actively receiving care from either provider listed above at [facility]. It is not appropriate to participate in the above eating plans without proper medical supervision.   Reference: Fung, J. The obesity code. [place]: [place]; 2016.   

## 2019-09-20 NOTE — Progress Notes (Signed)
Metrics: Intervention Frequency ACO  Documented Smoking Status Yearly  Screened one or more times in 24 months  Cessation Counseling or  Active cessation medication Past 24 months  Past 24 months   Guideline developer: UpToDate (See UpToDate for funding source) Date Released: 2014       Wellness Office Visit  Subjective:  Patient ID: Alexandra Garrett, female    DOB: [birth year]  Age: 34 y.o. MRN: [id number]  CC: This lady comes in for follow-up regarding all her blood work and further recommendations. HPI  I discussed all the results with her and she has vitamin D deficiency.  Her LH/FSH/testosterone levels are not quite typical of PCOS. Her T3 levels are low and she does describe some symptoms of thyroid deficiency.  These include weight gain, fatigue, previous menorrhagia. She continues to lose weight based on a calorie restricted diet and use of apple cider vinegar pills. Past Medical History:  Diagnosis Date  . Anemia   . History of medication noncompliance   . Seizures ([facility])    Since age 20 yrs   History reviewed. No pertinent surgical history.   Family History  Problem Relation Age of Onset  . Arthritis Mother   . Depression Mother   . Hypertension Mother   . Breast cancer Mother        lymph nodes  . Heart disease Father   . Arthritis Maternal Grandmother   . Cancer Maternal Grandmother        breast  . Diabetes Maternal Grandmother   . Cancer Maternal Grandfather        lung  . Stroke Paternal Grandmother   . Heart disease Paternal Grandfather     Social History   Social History Narrative   Works at [employer].   Married April 2020,second marriage.Lives with husband and 2 kids.      Social History   Tobacco Use  . Smoking status: Never Smoker  . Smokeless tobacco: Never Used  Substance Use Topics  . Alcohol use: No    Alcohol/week: 0.0 standard drinks    Current Meds  Medication Sig  . cetirizine (ZYRTEC) 10 MG tablet Take 1  tablet (10 mg total) by mouth daily.  Marland Kitchen levETIRAcetam (KEPPRA) 500 MG tablet Take 1 tablet (500 mg total) by mouth 2 (two) times daily.  Marland Kitchen levonorgestrel (MIRENA) 20 MCG/24HR IUD 1 each by Intrauterine route once.      Depression screen [facility] 2/9 03/19/2017 02/12/2017  Decreased Interest 0 0  Down, Depressed, Hopeless 0 0  PHQ - 2 Score 0 0     Objective:   Today's Vitals: BP 123/84 (BP Location: Right Arm, Patient Position: Sitting, Cuff Size: Normal)   Pulse (!) 55   Temp 98.4 F (36.9 C) (Temporal)   Resp 18   Ht 5\' 5"  (1.651 m)   Wt 250 lb 9.6 oz (113.7 kg)   SpO2 98%   BMI 41.70 kg/m  Vitals with BMI 09/20/2019 08/23/2019 06/20/2019  Height 5\' 5"  5\' 2"  -  Weight 250 lbs 10 oz 257 lbs -  BMI 41.7 46.99 -  Systolic 123 125 08/20/2019  Diastolic 84 80 84  Pulse 55 72 78     Physical Exam  She remains morbidly obese but has lost 7 pounds since the last time I saw her.  Blood pressure is in good range.     Assessment   1. Morbid obesity (HCC)   2. Vitamin D deficiency disease   3. Mixed hyperlipidemia  4. Malaise and fatigue       Tests ordered No orders of the defined types were placed in this encounter.    Plan: 1. For vitamin D deficiency, I recommended vitamin D3 10,000 units daily. 2. I believe she does have symptoms of thyroid deficiency so I am going to give her samples of NP thyroid 30 mg tablets, take 1 daily, being used off label, for symptoms of thyroid deficiency.  I explained to her possible side effects and how to deal with them. 3. As far as her morbid obesity is concerned, she has been successful so far based on a calorie restricted diet but I also introduced the concept of intermittent fasting combined with a more plant-based diet.  I have given her diet sheet for this also. 4. Follow-up in 6 weeks to see how she is doing and we will do blood work at that time also.  Today I spent 40 minutes with this patient discussing all her results in detail and  discussing nutrition above.   No orders of the defined types were placed in this encounter.   Wilson Singer, MD

## 2019-11-08 ENCOUNTER — Ambulatory Visit (INDEPENDENT_AMBULATORY_CARE_PROVIDER_SITE_OTHER): Payer: Medicaid Other | Admitting: Internal Medicine

## 2019-11-08 ENCOUNTER — Encounter (INDEPENDENT_AMBULATORY_CARE_PROVIDER_SITE_OTHER): Payer: Self-pay | Admitting: Internal Medicine

## 2019-11-08 ENCOUNTER — Other Ambulatory Visit: Payer: Self-pay

## 2019-11-08 DIAGNOSIS — E782 Mixed hyperlipidemia: Secondary | ICD-10-CM | POA: Diagnosis not present

## 2019-11-08 DIAGNOSIS — R079 Chest pain, unspecified: Secondary | ICD-10-CM

## 2019-11-08 DIAGNOSIS — E282 Polycystic ovarian syndrome: Secondary | ICD-10-CM | POA: Diagnosis not present

## 2019-11-08 MED ORDER — SPIRONOLACTONE 50 MG PO TABS: 50.0000 mg | ORAL_TABLET | Freq: Every day | ORAL | 3 refills | Status: DC

## 2019-11-08 MED ORDER — NP THYROID 30 MG PO TABS: 60.0000 mg | ORAL_TABLET | Freq: Every day | ORAL | 3 refills | Status: DC

## 2019-11-08 NOTE — Progress Notes (Signed)
Metrics: Intervention Frequency ACO  Documented Smoking Status Yearly  Screened one or more times in 24 months  Cessation Counseling or  Active cessation medication Past 24 months  Past 24 months   Guideline developer: UpToDate (See UpToDate for funding source) Date Released: 2014       Wellness Office Visit  Subjective:  Patient ID: Alexandra Garrett, female    DOB: 01-09-1986  Age: 34 y.o. MRN: [id number]  CC: This lady comes in for follow-up of morbid obesity, PCOS, symptoms of thyroid deficiency. HPI  She did tolerate samples of NP thyroid 30 mg daily but ran out 2 days ago.  She did not feel any improvement with this dose. She is also now complaining of acne which is getting worse. She is also complaining of palpitations when she exercises and chest pain also which lasted couple of minutes with exercise and resolves with rest.  The pain does not radiate anywhere and seems to be a heavy feeling on her chest. Past Medical History:  Diagnosis Date  . Anemia   . History of medication noncompliance   . Seizures (HCC)    Since age 21 yrs   History reviewed. No pertinent surgical history.   Family History  Problem Relation Age of Onset  . Arthritis Mother   . Depression Mother   . Hypertension Mother   . Breast cancer Mother        lymph nodes  . Heart disease Father   . Arthritis Maternal Grandmother   . Cancer Maternal Grandmother        breast  . Diabetes Maternal Grandmother   . Cancer Maternal Grandfather        lung  . Stroke Paternal Grandmother   . Heart disease Paternal Grandfather     Social History   Social History Narrative   Works at [employer].   Married April 2020,second marriage.Lives with husband and 2 kids.      Social History   Tobacco Use  . Smoking status: Never Smoker  . Smokeless tobacco: Never Used  Substance Use Topics  . Alcohol use: No    Alcohol/week: 0.0 standard drinks    Current Meds  Medication Sig  .  cetirizine (ZYRTEC) 10 MG tablet Take 1 tablet (10 mg total) by mouth daily.  Marland Kitchen levETIRAcetam (KEPPRA) 500 MG tablet Take 1 tablet (500 mg total) by mouth 2 (two) times daily.  Marland Kitchen levonorgestrel (MIRENA) 20 MCG/24HR IUD 1 each by Intrauterine route once.      Depression screen [facility] 2/9 03/19/2017 02/12/2017  Decreased Interest 0 0  Down, Depressed, Hopeless 0 0  PHQ - 2 Score 0 0     Objective:   Today's Vitals: BP 100/75 (BP Location: Left Arm, Patient Position: Sitting, Cuff Size: Normal)   Pulse 60   Temp (!) 97.3 F (36.3 C) (Temporal)   Ht 5\' 5"  (1.651 m)   Wt 246 lb (111.6 kg)   SpO2 99%   BMI 40.94 kg/m  Vitals with BMI 11/08/2019 09/20/2019 08/23/2019  Height 5\' 5"  5\' 5"  5\' 2"   Weight 246 lbs 250 lbs 10 oz 257 lbs  BMI 40.94 41.7 46.99  Systolic 100 123 08/25/2019  Diastolic 75 84 80  Pulse 60 55 72     Physical Exam  She looks systemically well.  She continues to lose further weight and has lost 4 pounds since the last time I saw her 6 weeks ago.  Blood pressure is in a very good range.  She remains morbidly obese.     Assessment   1. Morbid obesity (HCC)   2. Mixed hyperlipidemia   3. PCOS (polycystic ovarian syndrome)   4. Chest pain, unspecified type       Tests ordered Orders Placed This Encounter  Procedures  . Ambulatory referral to Cardiology     Plan: 1. I think we can afford to increase the dose of desiccated NP thyroid for symptoms of thyroid deficiency and I have sent the prescription now to the pharmacy. 2. As far as the chest pain is concerned, she is rather young to have coronary artery disease but I think she may have an arrhythmia based on her description so I will refer her to cardiology. 3. Today we discussed nutrition further and I stressed to her the importance of reducing animal protein from her diet which she eats 7 days a week and focusing more on vegetables, especially beans on a daily basis together with some nuts. 4. Today we also  discussed the importance of COVID-19 vaccination and I have stressed to her the importance of getting vaccinated and the science behind it and the safety and efficacy of this vaccine. 5. Follow-up in 6 weeks.  Today I spent 30 minutes with the patient discussing her health as well as COVID-19 vaccination above.   Meds ordered this encounter  Medications  . NP THYROID 30 MG tablet    Sig: Take 2 tablets (60 mg total) by mouth daily before breakfast.    Dispense:  60 tablet    Refill:  3  . spironolactone (ALDACTONE) 50 MG tablet    Sig: Take 1 tablet (50 mg total) by mouth daily.    Dispense:  30 tablet    Refill:  3    Yehudah Standing Normajean Glasgow, MD

## 2019-11-21 ENCOUNTER — Ambulatory Visit
Admission: EM | Admit: 2019-11-21 | Discharge: 2019-11-21 | Disposition: A | Discharge status: Home / Self Care | Payer: Medicaid Other | Attending: Emergency Medicine | Admitting: Emergency Medicine

## 2019-11-21 ENCOUNTER — Other Ambulatory Visit: Payer: Self-pay

## 2019-11-21 ENCOUNTER — Encounter: Payer: Self-pay | Admitting: Emergency Medicine

## 2019-11-21 DIAGNOSIS — Z20822 Contact with and (suspected) exposure to covid-19: Secondary | ICD-10-CM

## 2019-11-21 DIAGNOSIS — R110 Nausea: Secondary | ICD-10-CM

## 2019-11-21 DIAGNOSIS — R197 Diarrhea, unspecified: Secondary | ICD-10-CM

## 2019-11-21 MED ORDER — ONDANSETRON HCL 4 MG PO TABS: 4.0000 mg | ORAL_TABLET | Freq: Four times a day (QID) | ORAL | 0 refills | Status: DC

## 2019-11-21 NOTE — ED Triage Notes (Signed)
Diarrhea, nausea and fatigue that started yesterday. Exposed to co worker that was covid +, would like covid test.  Denies burning with urination or frequency.

## 2019-11-21 NOTE — ED Provider Notes (Signed)
[facility]   [id number] 11/21/19 Arrival Time: 0840   CC: Diarrhea  SUBJECTIVE: History from: patient.  Alexandra Garrett is a 34 y.o. female who presents with 2 episodes of watery diarrhea, nausea, and fatigue x 1 day.  Admits to COVID positive exposure.  Has tried OTC medications with relief.  Denies aggravating factors.  Reports previous symptoms in the past.   Denies fever, chills, sinus pain, rhinorrhea, sore throat, SOB, wheezing, chest pain, nausea, changes in bladder habits.    ROS: As per HPI.  All other pertinent ROS negative.     Past Medical History:  Diagnosis Date  . Anemia   . History of medication noncompliance   . Seizures (HCC)    Since age 32 yrs   History reviewed. No pertinent surgical history. No Known Allergies No current facility-administered medications on file prior to encounter.   Current Outpatient Medications on File Prior to Encounter  Medication Sig Dispense Refill  . cetirizine (ZYRTEC) 10 MG tablet Take 1 tablet (10 mg total) by mouth daily. 30 tablet 11  . levETIRAcetam (KEPPRA) 500 MG tablet Take 1 tablet (500 mg total) by mouth 2 (two) times daily. 60 tablet 3  . levonorgestrel (MIRENA) 20 MCG/24HR IUD 1 each by Intrauterine route once.    . NP THYROID 30 MG tablet Take 2 tablets (60 mg total) by mouth daily before breakfast. 60 tablet 3  . spironolactone (ALDACTONE) 50 MG tablet Take 1 tablet (50 mg total) by mouth daily. 30 tablet 3  . [DISCONTINUED] levETIRAcetam (KEPPRA XR) 500 MG 24 hr tablet Take 2 tablets (1,000 mg total) by mouth at bedtime. 60 tablet 0   Social History   Socioeconomic History  . Marital status: Divorced    Spouse name: Not on file  . Number of children: Not on file  . Years of education: Not on file  . Highest education level: Not on file  Occupational History  . Not on file  Tobacco Use  . Smoking status: Never Smoker  . Smokeless tobacco: Never Used  Vaping Use  . Vaping Use: Never used    Substance and Sexual Activity  . Alcohol use: No    Alcohol/week: 0.0 standard drinks  . Drug use: No  . Sexual activity: Not Currently    Birth control/protection: I.U.D.  Other Topics Concern  . Not on file  Social History Narrative   Works at [employer].   Married April 2020,second marriage.Lives with husband and 2 kids.      Social Determinants of Health   Financial Resource Strain:   . Difficulty of Paying Living Expenses: Not on file  Food Insecurity:   . Worried About Programme researcher, broadcasting/film/video in the Last Year: Not on file  . Ran Out of Food in the Last Year: Not on file  Transportation Needs:   . Lack of Transportation (Medical): Not on file  . Lack of Transportation (Non-Medical): Not on file  Physical Activity:   . Days of Exercise per Week: Not on file  . Minutes of Exercise per Session: Not on file  Stress:   . Feeling of Stress : Not on file  Social Connections:   . Frequency of Communication with Friends and Family: Not on file  . Frequency of Social Gatherings with Friends and Family: Not on file  . Attends Religious Services: Not on file  . Active Member of Clubs or Organizations: Not on file  . Attends Banker Meetings: Not on  file  . Marital Status: Not on file  Intimate Partner Violence:   . Fear of Current or Ex-Partner: Not on file  . Emotionally Abused: Not on file  . Physically Abused: Not on file  . Sexually Abused: Not on file   Family History  Problem Relation Age of Onset  . Arthritis Mother   . Depression Mother   . Hypertension Mother   . Breast cancer Mother        lymph nodes  . Heart disease Father   . Arthritis Maternal Grandmother   . Cancer Maternal Grandmother        breast  . Diabetes Maternal Grandmother   . Cancer Maternal Grandfather        lung  . Stroke Paternal Grandmother   . Heart disease Paternal Grandfather     OBJECTIVE:  Vitals:   11/21/19 0859 11/21/19 0902 11/21/19 0903  BP:  123/81 105/66   Pulse: (!) 56 70   Resp: 16 16   Temp: 98.4 F (36.9 C) 98.1 F (36.7 C)   TempSrc: Oral Oral   SpO2: 98% 97%   Weight:   240 lb (108.9 kg)  Height:   5\' 2"  (1.575 m)     General appearance: alert; appears fatigued, but nontoxic; speaking in full sentences and tolerating own secretions HEENT: NCAT; Ears: EACs clear; Eyes: PERRL.  EOM grossly intact. Nose: nares patent without rhinorrhea, Throat: oropharynx clear, tonsils non erythematous or enlarged, uvula midline  Neck: supple without LAD Lungs: unlabored respirations, symmetrical air entry; cough: absent; no respiratory distress; CTAB Heart: regular rate and rhythm.   Abdomen: soft, nondistended, normal active bowel sounds; nontender to palpation; no guarding  Skin: warm and dry Psychological: alert and cooperative; normal mood and affect  ASSESSMENT & PLAN:  1. Close exposure to COVID-19 virus   2. Diarrhea, unspecified type   3. Nausea without vomiting     Meds ordered this encounter  Medications  . ondansetron (ZOFRAN) 4 MG tablet    Sig: Take 1 tablet (4 mg total) by mouth every 6 (six) hours.    Dispense:  12 tablet    Refill:  0    Order Specific Question:   Supervising Provider    Answer:   Eustace Moore   COVID testing ordered.  It will take between 5-7 days for test results.  Someone will contact you regarding abnormal results.    In the meantime: You should remain isolated in your home for 10 days from symptom onset AND greater than 72 hours after symptoms resolution (absence of fever without the use of fever-reducing medication and improvement in respiratory symptoms), whichever is longer Get plenty of rest and push fluids Zofran for nausea Use OTC zyrtec for nasal congestion, runny nose, and/or sore throat Use OTC flonase for nasal congestion and runny nose Use medications daily for symptom relief Use OTC medications like ibuprofen or tylenol as needed fever or pain Call or  go to the ED if you have any new or worsening symptoms such as fever, cough, shortness of breath, chest tightness, chest pain, turning blue, changes in mental status, etc...   Reviewed expectations re: course of current medical issues. Questions answered. Outlined signs and symptoms indicating need for more acute intervention. Patient verbalized understanding. After Visit Summary given.         [[id number]], PA-C 11/21/19 [phone]

## 2019-11-21 NOTE — Discharge Instructions (Addendum)
COVID testing ordered.  It will take between 5-7 days for test results.  Someone will contact you regarding abnormal results.    In the meantime: You should remain isolated in your home for 10 days from symptom onset AND greater than 72 hours after symptoms resolution (absence of fever without the use of fever-reducing medication and improvement in respiratory symptoms), whichever is longer Get plenty of rest and push fluids Zofran for nausea Use OTC zyrtec for nasal congestion, runny nose, and/or sore throat Use OTC flonase for nasal congestion and runny nose Use medications daily for symptom relief Use OTC medications like ibuprofen or tylenol as needed fever or pain Call or go to the ED if you have any new or worsening symptoms such as fever, cough, shortness of breath, chest tightness, chest pain, turning blue, changes in mental status, etc..Marland Kitchen

## 2019-11-23 LAB — SARS-COV-2, NAA 2 DAY TAT

## 2019-11-23 LAB — NOVEL CORONAVIRUS, NAA: SARS-CoV-2, NAA: NOT DETECTED

## 2019-12-07 ENCOUNTER — Other Ambulatory Visit (INDEPENDENT_AMBULATORY_CARE_PROVIDER_SITE_OTHER): Payer: Self-pay | Admitting: Internal Medicine

## 2019-12-21 ENCOUNTER — Ambulatory Visit (INDEPENDENT_AMBULATORY_CARE_PROVIDER_SITE_OTHER): Payer: Medicaid Other | Admitting: Internal Medicine

## 2020-02-13 ENCOUNTER — Encounter: Payer: Self-pay | Admitting: Cardiology

## 2020-02-13 ENCOUNTER — Ambulatory Visit (INDEPENDENT_AMBULATORY_CARE_PROVIDER_SITE_OTHER): Payer: Medicaid Other | Admitting: Cardiology

## 2020-02-13 VITALS — BP 90/50 | HR 62 | Ht 62.0 in | Wt 252.6 lb

## 2020-02-13 DIAGNOSIS — R079 Chest pain, unspecified: Secondary | ICD-10-CM

## 2020-02-13 DIAGNOSIS — I209 Angina pectoris, unspecified: Secondary | ICD-10-CM

## 2020-02-13 DIAGNOSIS — Z8249 Family history of ischemic heart disease and other diseases of the circulatory system: Secondary | ICD-10-CM | POA: Diagnosis not present

## 2020-02-13 DIAGNOSIS — E282 Polycystic ovarian syndrome: Secondary | ICD-10-CM

## 2020-02-13 DIAGNOSIS — Z87898 Personal history of other specified conditions: Secondary | ICD-10-CM

## 2020-02-13 MED ORDER — METOPROLOL TARTRATE 100 MG PO TABS: 100.0000 mg | ORAL_TABLET | Freq: Once | ORAL | 0 refills | Status: DC

## 2020-02-13 NOTE — Patient Instructions (Addendum)
Medication Instructions:   Your physician recommends that you continue on your current medications as directed. Please refer to the Current Medication list given to you today.  Labwork:  None  Testing/Procedures:  Cardiac CTA-see instructions below  Follow-Up:  Your physician recommends that you schedule a follow-up appointment in: pending  Any Other Special Instructions Will Be Listed Below (If Applicable).  If you need a refill on your cardiac medications before your next appointment, please call your pharmacy.  Your cardiac CT will be scheduled at one of the below locations:   [facility] [address] [place], [place] [address] [phone]   If scheduled at [facility], please arrive at the [facility] main entrance of [facility] 30 minutes prior to test start time. Proceed to the [facility] Radiology Department (first floor) to check-in and test prep.   Please follow these instructions carefully (unless otherwise directed):    On the Night Before the Test: . Be sure to Drink plenty of water. . Do not consume any caffeinated/decaffeinated beverages or chocolate 12 hours prior to your test. . Do not take any antihistamines 12 hours prior to your test. (zyrtec or zoftan)  On the Day of the Test: . Drink plenty of water. Do not drink any water within one hour of the test. . Do not eat any food 4 hours prior to the test. . You may take your regular medications prior to the test.  . Take metoprolol 100 mg (Lopressor) two hours prior to test. . FEMALES- please wear underwire-free bra if available        After the Test: . Drink plenty of water. . After receiving IV contrast, you may experience a mild flushed feeling. This is normal. . On occasion, you may experience a mild rash up to 24 hours after the test. This is not dangerous. If this occurs, you can take Benadryl 25 mg and increase your fluid intake. . If you experience trouble  breathing, this can be serious. If it is severe call 911 IMMEDIATELY. If it is mild, please call our office.   Once we have confirmed authorization from your insurance company, we will call you to set up a date and time for your test. Based on how quickly your insurance processes prior authorizations requests, please allow up to 4 weeks to be contacted for scheduling your Cardiac CT appointment. Be advised that routine Cardiac CT appointments could be scheduled as many as 8 weeks after your provider has ordered it.  For non-scheduling related questions, please contact the cardiac imaging nurse navigator should you have any questions/concerns: Marchia Bond, Cardiac Imaging Nurse Navigator Burley Saver, Interim Cardiac Imaging Nurse [place] [facility] Direct Office Dial: [phone]   For scheduling needs, including cancellations and rescheduling, please call [place], [phone].

## 2020-02-13 NOTE — Progress Notes (Signed)
Cardiology Office Note  Date: 02/13/2020   ID: Alexandra Garrett, DOB 1985/07/08, MRN [id number]  PCP:  Wilson Singer, MD  Cardiologist:  Nona Dell, MD Electrophysiologist:  None   Chief Complaint  Patient presents with  . Chest Pain    History of Present Illness: Alexandra Garrett is a 34 y.o. female referred for cardiology consultation by Dr. Karilyn Cota for the evaluation of chest pain, I reviewed the office encounter note from August.  She is here today with her husband.  She tells me that over the last few months going back to the summer she has been experiencing intermittent episodes of chest tightness.  She first noticed symptoms when she went to the gym to start an exercise plan in the summer, was walking on the treadmill and using a stationary bicycle.  She states that her heart rate increased very rapidly with activity, up to 200 bpm, she felt tight in her chest.  She has had recurring symptoms with other levels of activity including emotional stress, feeling of discomfort/tingling in her left arm when this happens.  She does not report specifically any sudden onset of rapid heartbeat, has had no syncope, although has had orthostatic lightheadedness.  She reports a family history of premature CAD in her father.  She has no personal history of hypertension.  She is currently on Aldactone for treatment of acne by Dr. Karilyn Cota.  Blood pressure was on the low side today.  LDL was 102 in June.  Hemoglobin A1c was 5.4% in 2019.  Also has history of PCOS per Dr. Patty Sermons documentation.  I personally reviewed her ECG today which shows sinus bradycardia with nonspecific T wave changes.   Past Medical History:  Diagnosis Date  . Anemia   . History of medication noncompliance   . Seizures (HCC)    Since age 42 yrs    Past Surgical History:  Procedure Laterality Date  . NO PAST SURGERIES      Current Outpatient Medications  Medication Sig Dispense Refill  . cetirizine  (ZYRTEC) 10 MG tablet Take 1 tablet (10 mg total) by mouth daily. 30 tablet 11  . levETIRAcetam (KEPPRA) 500 MG tablet TAKE 1 TABLET(500 MG) BY MOUTH TWICE DAILY 60 tablet 2  . levonorgestrel (MIRENA) 20 MCG/24HR IUD 1 each by Intrauterine route once.    . NP THYROID 30 MG tablet Take 2 tablets (60 mg total) by mouth daily before breakfast. 60 tablet 3  . ondansetron (ZOFRAN) 4 MG tablet Take 1 tablet (4 mg total) by mouth every 6 (six) hours. 12 tablet 0  . spironolactone (ALDACTONE) 50 MG tablet Take 1 tablet (50 mg total) by mouth daily. 30 tablet 3  . metoprolol tartrate (LOPRESSOR) 100 MG tablet Take 1 tablet (100 mg total) by mouth once for 1 dose. 2 hours before your cardiac cta 1 tablet 0   No current facility-administered medications for this visit.   Allergies:  Patient has no known allergies.   Social History: The patient  reports that she has never smoked. She has never used smokeless tobacco. She reports that she does not drink alcohol and does not use drugs.   Family History: The patient's family history includes Arthritis in her maternal grandmother and mother; Breast cancer in her mother; Cancer in her maternal grandfather and maternal grandmother; Depression in her mother; Diabetes in her maternal grandmother; Heart disease in her father and paternal grandfather; Hypertension in her mother; Stroke in her paternal grandmother.  ROS: No orthopnea or PND.  Physical Exam: VS:  BP (!) 90/50   Pulse 62   Ht 5\' 2"  (1.575 m)   Wt 252 lb 9.6 oz (114.6 kg)   SpO2 98%   BMI 46.20 kg/m , BMI Body mass index is 46.2 kg/m.  Wt Readings from Last 3 Encounters:  02/13/20 252 lb 9.6 oz (114.6 kg)  11/21/19 240 lb (108.9 kg)  11/08/19 246 lb (111.6 kg)    General: Patient appears comfortable at rest. HEENT: Conjunctiva and lids normal, wearing a mask. Neck: Supple, no elevated JVP or carotid bruits, no thyromegaly. Lungs: Clear to auscultation, nonlabored breathing at  rest. Cardiac: Regular rate and rhythm, no S3 or significant systolic murmur. Abdomen: Soft, nontender, bowel sounds present. Extremities: No pitting edema, distal pulses 2+. Skin: Warm and dry.  Facial acne. Musculoskeletal: No kyphosis. Neuropsychiatric: Alert and oriented x3, affect grossly appropriate.  ECG:  An ECG dated 08/30/2016 was personally reviewed today and demonstrated:  Sinus rhythm with decreased R wave progression.  Recent Labwork: 08/23/2019: ALT 26; AST 16; BUN 12; Creat 0.84; Hemoglobin 13.8; Platelets 336; Potassium 4.2; Sodium 140; TSH 1.27     Component Value Date/Time   CHOL 154 08/23/2019 0909   TRIG 90 08/23/2019 0909   HDL 34 (L) 08/23/2019 0909   CHOLHDL 4.5 (H) 08/23/2019 0909   LDLCALC 102 (H) 08/23/2019 08/25/2019    Other Studies Reviewed Today:  No prior cardiac testing for review today.  Assessment and Plan:  7.  34 year old woman with a history of PCOS, premature CAD in her father reporting recurring chest tightness associated with certain levels of activity and also emotional upset, intermittent radiation to the left arm.  LDL 102 in June with HDL 34.  Baseline ECG shows sinus bradycardia with nonspecific T wave changes.  Symptoms are concerning based on description for evaluation of angina pectoris.  We have discussed cardiac CTA and she is in agreement to proceed.  2.  Low normal to low blood pressure.  I note that she is on spironolactone per Dr. July for treatment of acne vulgaris.  I asked her to cut the dose in half for now and follow-up with him.  3.  History of seizures, stable on Keppra.  Medication Adjustments/Labs and Tests Ordered: Current medicines are reviewed at length with the patient today.  Concerns regarding medicines are outlined above.   Tests Ordered: Orders Placed This Encounter  Procedures  . CT CORONARY MORPH W/CTA COR W/SCORE W/CA W/CM &/OR WO/CM  . CT CORONARY FRACTIONAL FLOW RESERVE DATA PREP  . CT CORONARY FRACTIONAL  FLOW RESERVE FLUID ANALYSIS  . EKG 12-Lead    Medication Changes: Meds ordered this encounter  Medications  . metoprolol tartrate (LOPRESSOR) 100 MG tablet    Sig: Take 1 tablet (100 mg total) by mouth once for 1 dose. 2 hours before your cardiac cta    Dispense:  1 tablet    Refill:  0    Disposition:  Follow up test results.  Signed, Karilyn Cota, MD, [facility] 02/13/2020 3:15 PM    [facility] at [facility] [address] [place], [place], [place] Phone: [phone]; Fax: [phone]

## 2020-03-04 ENCOUNTER — Other Ambulatory Visit (INDEPENDENT_AMBULATORY_CARE_PROVIDER_SITE_OTHER): Payer: Self-pay | Admitting: Internal Medicine

## 2020-03-11 ENCOUNTER — Other Ambulatory Visit (INDEPENDENT_AMBULATORY_CARE_PROVIDER_SITE_OTHER): Payer: Self-pay | Admitting: Nurse Practitioner

## 2020-03-11 ENCOUNTER — Other Ambulatory Visit (INDEPENDENT_AMBULATORY_CARE_PROVIDER_SITE_OTHER): Payer: Self-pay | Admitting: Internal Medicine

## 2020-03-19 ENCOUNTER — Telehealth (HOSPITAL_COMMUNITY): Payer: Self-pay | Admitting: Emergency Medicine

## 2020-03-19 NOTE — Telephone Encounter (Signed)
Reaching out to patient to offer assistance regarding upcoming cardiac imaging study; pt verbalizes understanding of appt date/time, parking situation and where to check in, pre-test NPO status and medications ordered, and verified current allergies; name and call back number provided for further questions should they arise Louana Fontenot RN Navigator Cardiac Imaging [facility] [phone] office [phone] cell 

## 2020-03-20 ENCOUNTER — Encounter: Payer: Self-pay | Admitting: *Deleted

## 2020-03-20 ENCOUNTER — Other Ambulatory Visit: Payer: Self-pay

## 2020-03-20 ENCOUNTER — Ambulatory Visit (HOSPITAL_COMMUNITY)
Admission: RE | Admit: 2020-03-20 | Discharge: 2020-03-20 | Disposition: A | Discharge status: Home / Self Care | Payer: Medicaid Other | Source: Ambulatory Visit | Attending: Cardiology | Admitting: Cardiology

## 2020-03-20 DIAGNOSIS — R079 Chest pain, unspecified: Secondary | ICD-10-CM | POA: Insufficient documentation

## 2020-03-20 DIAGNOSIS — Z87898 Personal history of other specified conditions: Secondary | ICD-10-CM | POA: Diagnosis present

## 2020-03-20 DIAGNOSIS — Z006 Encounter for examination for normal comparison and control in clinical research program: Secondary | ICD-10-CM

## 2020-03-20 MED ORDER — NITROGLYCERIN 0.4 MG SL SUBL
SUBLINGUAL_TABLET | SUBLINGUAL | Status: AC
  Administered 2020-03-20: 0.8 mg via SUBLINGUAL
  Filled 2020-03-20: qty 2

## 2020-03-20 MED ORDER — IOHEXOL 350 MG/ML SOLN
80.0000 mL | Freq: Once | INTRAVENOUS | Status: AC | PRN
  Administered 2020-03-20: 80 mL via INTRAVENOUS

## 2020-03-20 MED ORDER — NITROGLYCERIN 0.4 MG SL SUBL: 0.8000 mg | SUBLINGUAL_TABLET | Freq: Once | SUBLINGUAL | Status: AC

## 2020-03-20 NOTE — Research (Signed)
IDENTIFY Informed Consent                  Subject Name:   Alexandra Garrett. [name]   Subject met inclusion and exclusion criteria.  The informed consent form, study requirements and expectations were reviewed with the subject and questions and concerns were addressed prior to the signing of the consent form.  The subject verbalized understanding of the trial requirements.  The subject agreed to participate in the IDENTIFY trial and signed the informed consent.  The informed consent was obtained prior to performance of any protocol-specific procedures for the subject.  A copy of the signed informed consent was given to the subject and a copy was placed in the subject's medical record.   [place] Taneisha Fuson, Research Assistant  03-20-2020 16:16 p.m.

## 2020-03-21 ENCOUNTER — Telehealth: Payer: Self-pay | Admitting: *Deleted

## 2020-03-21 NOTE — Telephone Encounter (Signed)
-----   Message from Jonelle Sidle, MD sent at 03/20/2020  8:08 PM EST ----- Results reviewed.  Please let her know that the coronary CTA was very reassuring, calcium score of 0, and no evidence of obstructive coronary artery disease.  Also no acute extracardiac findings.  No additional cardiac testing is planned at this point. Would keep follow-up with Dr. Karilyn Cota.

## 2020-03-22 NOTE — Telephone Encounter (Signed)
Patient informed. Copy sent to PCP °

## 2020-03-28 ENCOUNTER — Telehealth (INDEPENDENT_AMBULATORY_CARE_PROVIDER_SITE_OTHER): Payer: Self-pay

## 2020-03-28 DIAGNOSIS — R569 Unspecified convulsions: Secondary | ICD-10-CM

## 2020-03-28 MED ORDER — LEVETIRACETAM 500 MG PO TABS: 500.0000 mg | ORAL_TABLET | Freq: Two times a day (BID) | ORAL | 1 refills | Status: DC

## 2020-03-28 NOTE — Telephone Encounter (Signed)
Called patient and let her know that her medication has been sent to the requested pharmacy. Patient verbalized an understanding.

## 2020-03-28 NOTE — Telephone Encounter (Signed)
Refill approved and sent to [facility] on [facility].

## 2020-03-28 NOTE — Telephone Encounter (Signed)
Patient called and left a detailed voice message that she is out of her seizure medication and needs a refill of the following:  levETIRAcetam (KEPPRA) 500 MG tablet  Last filled 12/07/2019, # 60 with 2 refills  Last OV 11/08/2019

## 2020-06-11 ENCOUNTER — Telehealth (INDEPENDENT_AMBULATORY_CARE_PROVIDER_SITE_OTHER): Payer: Self-pay

## 2020-06-11 ENCOUNTER — Encounter (INDEPENDENT_AMBULATORY_CARE_PROVIDER_SITE_OTHER): Payer: Self-pay | Admitting: Internal Medicine

## 2020-06-11 ENCOUNTER — Ambulatory Visit (INDEPENDENT_AMBULATORY_CARE_PROVIDER_SITE_OTHER): Payer: Medicaid Other | Admitting: Internal Medicine

## 2020-06-11 ENCOUNTER — Other Ambulatory Visit: Payer: Self-pay

## 2020-06-11 VITALS — BP 114/66 | HR 75 | Temp 97.7°F | Ht 62.0 in | Wt 257.6 lb

## 2020-06-11 DIAGNOSIS — R569 Unspecified convulsions: Secondary | ICD-10-CM

## 2020-06-11 DIAGNOSIS — R413 Other amnesia: Secondary | ICD-10-CM | POA: Diagnosis not present

## 2020-06-11 MED ORDER — LEVETIRACETAM 500 MG PO TABS: 500.0000 mg | ORAL_TABLET | Freq: Two times a day (BID) | ORAL | 0 refills | Status: DC

## 2020-06-11 NOTE — Telephone Encounter (Signed)
Patient called and left a detailed voice message that she wanted to know who she was referred to for her Epilepsy and she also had a question about her husband. I see there was a referral from 2020 for Neurology Dr. Stephanie Acre, no show appointment on 12/28/2018.  Left a detailed voice message for patient to call back.

## 2020-06-11 NOTE — Telephone Encounter (Signed)
Patient called back and I scheduled her to come to our office today at 1pm to discuss. Patient verbalized an understanding.

## 2020-06-11 NOTE — Progress Notes (Signed)
Metrics: Intervention Frequency ACO  Documented Smoking Status Yearly  Screened one or more times in 24 months  Cessation Counseling or  Active cessation medication Past 24 months  Past 24 months   Guideline developer: UpToDate (See UpToDate for funding source) Date Released: 2014       Wellness Office Visit  Subjective:  Patient ID: Alexandra Garrett, female    DOB: 03-16-1985  Age: 35 y.o. MRN: [id number]  CC: Seizure, memory loss HPI  This lady had a witnessed what appears to be tonic-clonic seizure 2 weeks ago which lasted at least a couple of minutes.  911 was called but by the time they arrived, she appeared to be somewhat orientated and did not want to go to the emergency room so therefore the 911 would not take her.  She is compliant with taking her Keppra twice a day as before.  Prior to this seizure 2 weeks ago, the last one was approximately a year ago.  The husband says that she has been under a lot of stress at work recently which may have triggered this.  However, what is unusual, is that she seems to have more memory loss since the seizure.  She cannot remember recent events and sometimes has word finding difficulties. Past Medical History:  Diagnosis Date  . Anemia   . History of medication noncompliance   . Seizures (HCC)    Since age 14 yrs   Past Surgical History:  Procedure Laterality Date  . NO PAST SURGERIES       Family History  Problem Relation Age of Onset  . Arthritis Mother   . Depression Mother   . Hypertension Mother   . Breast cancer Mother        lymph nodes  . Heart disease Father   . Arthritis Maternal Grandmother   . Cancer Maternal Grandmother        breast  . Diabetes Maternal Grandmother   . Cancer Maternal Grandfather        lung  . Stroke Paternal Grandmother   . Heart disease Paternal Grandfather     Social History   Social History Narrative   Works at [employer].   Married April 2020,second marriage.Lives with husband and  2 kids.      Social History   Tobacco Use  . Smoking status: Never Smoker  . Smokeless tobacco: Never Used  Substance Use Topics  . Alcohol use: No    Alcohol/week: 0.0 standard drinks    Current Meds  Medication Sig  . cetirizine (ZYRTEC) 10 MG tablet Take 1 tablet (10 mg total) by mouth daily.  Marland Kitchen levonorgestrel (MIRENA) 20 MCG/24HR IUD 1 each by Intrauterine route once.  . [DISCONTINUED] levETIRAcetam (KEPPRA) 500 MG tablet Take 1 tablet (500 mg total) by mouth 2 (two) times daily.  . [DISCONTINUED] NP THYROID 30 MG tablet Take 2 tablets (60 mg total) by mouth daily before breakfast.  . [DISCONTINUED] ondansetron (ZOFRAN) 4 MG tablet Take 1 tablet (4 mg total) by mouth every 6 (six) hours.     Flowsheet Row Office Visit from 06/11/2020 in [facility]  PHQ-9 Total Score 0      Objective:   Today's Vitals: BP 114/66   Pulse 75   Temp 97.7 F (36.5 C) (Temporal)   Ht 5\' 2"  (1.575 m)   Wt 257 lb 9.6 oz (116.8 kg)   SpO2 99%   BMI 47.12 kg/m  Vitals with BMI 06/11/2020 03/20/2020 03/20/2020  Height 5\' 2"  - -  Weight 257 lbs 10 oz - -  BMI 47.1 - -  Systolic 114 111 270  Diastolic 66 65 55  Pulse 75 60 62     Physical Exam  She remains morbidly obese.  Today she is alert and orientated without any obvious focal neurological signs.     Assessment   1. Seizures (HCC)   2. Memory loss       Tests ordered Orders Placed This Encounter  Procedures  . Ambulatory referral to Neurology     Plan: 1. I will refer her fairly urgently to neurology in [facility] for further evaluation.  She made an MR of her brain and I have told the patient and the husband that if she gets another seizure, she should go to the emergency room.  She will continue with Keppra which I have refilled today. 2. Follow-up with Maralyn Sago in about 2 months.   Meds ordered this encounter  Medications  . levETIRAcetam (KEPPRA) 500 MG tablet    Sig: Take 1 tablet (500 mg total) by  mouth 2 (two) times daily.    Dispense:  180 tablet    Refill:  0    Aikam Hellickson Normajean Glasgow, MD

## 2020-06-14 ENCOUNTER — Encounter: Payer: Self-pay | Admitting: Neurology

## 2020-06-14 ENCOUNTER — Ambulatory Visit: Payer: Medicaid Other | Admitting: Neurology

## 2020-06-14 VITALS — BP 119/80 | HR 57 | Ht 62.0 in | Wt 257.0 lb

## 2020-06-14 DIAGNOSIS — R569 Unspecified convulsions: Secondary | ICD-10-CM | POA: Diagnosis not present

## 2020-06-14 DIAGNOSIS — G44209 Tension-type headache, unspecified, not intractable: Secondary | ICD-10-CM

## 2020-06-14 DIAGNOSIS — G40309 Generalized idiopathic epilepsy and epileptic syndromes, not intractable, without status epilepticus: Secondary | ICD-10-CM | POA: Diagnosis not present

## 2020-06-14 DIAGNOSIS — R413 Other amnesia: Secondary | ICD-10-CM | POA: Diagnosis not present

## 2020-06-14 MED ORDER — LEVETIRACETAM 750 MG PO TABS
750.0000 mg | ORAL_TABLET | Freq: Two times a day (BID) | ORAL | 3 refills | Status: DC
Start: 2020-06-14 — End: 2020-10-25

## 2020-06-14 NOTE — Patient Instructions (Signed)
I had a long discussion with the patient with regards to her longstanding history of nocturnal epilepsy and generalized seizures.  She is still having them on the current dose of Keppra and I recommend increasing the dose to 750 mg twice daily.  Check EEG and MRI scan of the brain with and without contrast.  She is having some memory and cognitive difficulties following her last seizure as well as some tension headaches.  Recommend checking memory panel labs and doing neck stretching exercises.  I also advised her to increase participation in stress laxation activities like meditation yoga and regular exercises.  She was advised to stay away from seizure provoking triggers like sleep deprivation, medication noncompliance, stimulants like alcohol, marijuana, substance abuse drugs extremes of dieting and physical activities.  She will return for follow-up in the future in 3 months or call earlier if necessary.  Neck Exercises Ask your health care provider which exercises are safe for you. Do exercises exactly as told by your health care provider and adjust them as directed. It is normal to feel mild stretching, pulling, tightness, or discomfort as you do these exercises. Stop right away if you feel sudden pain or your pain gets worse. Do not begin these exercises until told by your health care provider. Neck exercises can be important for many reasons. They can improve strength and maintain flexibility in your neck, which will help your upper back and prevent neck pain. Stretching exercises Rotation neck stretching 1. Sit in a chair or stand up. 2. Place your feet flat on the floor, shoulder width apart. 3. Slowly turn your head (rotate) to the right until a slight stretch is felt. Turn it all the way to the right so you can look over your right shoulder. Do not tilt or tip your head. 4. Hold this position for 10-30 seconds. 5. Slowly turn your head (rotate) to the left until a slight stretch is felt. Turn  it all the way to the left so you can look over your left shoulder. Do not tilt or tip your head. 6. Hold this position for 10-30 seconds. Repeat __________ times. Complete this exercise __________ times a day.   Neck retraction 1. Sit in a sturdy chair or stand up. 2. Look straight ahead. Do not bend your neck. 3. Use your fingers to push your chin backward (retraction). Do not bend your neck for this movement. Continue to face straight ahead. If you are doing the exercise properly, you will feel a slight sensation in your throat and a stretch at the back of your neck. 4. Hold the stretch for 1-2 seconds. Repeat __________ times. Complete this exercise __________ times a day. Strengthening exercises Neck press 1. Lie on your back on a firm bed or on the floor with a pillow under your head. 2. Use your neck muscles to push your head down on the pillow and straighten your spine. 3. Hold the position as well as you can. Keep your head facing up (in a neutral position) and your chin tucked. 4. Slowly count to 5 while holding this position. Repeat __________ times. Complete this exercise __________ times a day. Isometrics These are exercises in which you strengthen the muscles in your neck while keeping your neck still (isometrics). 1. Sit in a supportive chair and place your hand on your forehead. 2. Keep your head and face facing straight ahead. Do not flex or extend your neck while doing isometrics. 3. Push forward with your head and neck  while pushing back with your hand. Hold for 10 seconds. 4. Do the sequence again, this time putting your hand against the back of your head. Use your head and neck to push backward against the hand pressure. 5. Finally, do the same exercise on either side of your head, pushing sideways against the pressure of your hand. Repeat __________ times. Complete this exercise __________ times a day. Prone head lifts 1. Lie face-down (prone position), resting on your  elbows so that your chest and upper back are raised. 2. Start with your head facing downward, near your chest. Position your chin either on or near your chest. 3. Slowly lift your head upward. Lift until you are looking straight ahead. Then continue lifting your head as far back as you can comfortably stretch. 4. Hold your head up for 5 seconds. Then slowly lower it to your starting position. Repeat __________ times. Complete this exercise __________ times a day. Supine head lifts 1. Lie on your back (supine position), bending your knees to point to the ceiling and keeping your feet flat on the floor. 2. Lift your head slowly off the floor, raising your chin toward your chest. 3. Hold for 5 seconds. Repeat __________ times. Complete this exercise __________ times a day. Scapular retraction 1. Stand with your arms at your sides. Look straight ahead. 2. Slowly pull both shoulders (scapulae) backward and downward (retraction) until you feel a stretch between your shoulder blades in your upper back. 3. Hold for 10-30 seconds. 4. Relax and repeat. Repeat __________ times. Complete this exercise __________ times a day. Contact a health care provider if:  Your neck pain or discomfort gets much worse when you do an exercise.  Your neck pain or discomfort does not improve within 2 hours after you exercise. If you have any of these problems, stop exercising right away. Do not do the exercises again unless your health care provider says that you can. Get help right away if:  You develop sudden, severe neck pain. If this happens, stop exercising right away. Do not do the exercises again unless your health care provider says that you can. This information is not intended to replace advice given to you by your health care provider. Make sure you discuss any questions you have with your health care provider. Document Revised: 12/23/2017 Document Reviewed: 12/23/2017 Elsevier Patient Education  2021  [employer].

## 2020-06-14 NOTE — Progress Notes (Signed)
[facility] [address] [place]. [place] [address] [phone]       OFFICE CONSULT NOTE  Alexandra Garrett Date of Birth:  12-Dec-1985 Medical Record Number:  [id number]   Referring MD: Lilly Cove Reason for Referral: Seizure  HPI: Alexandra Garrett is a 35 year old Caucasian lady seen today for initial office consultation visit for seizures.  History is obtained from the patient, review of electronic medical records and I personally reviewed available imaging films in PACS.  Patient has past medical history of anemia, seizures.  Her seizures began at approximately age 29 and continued for around 10 years and stopped at age 30.  She was seen by Dr. Sharene Skeans in was on Tegretol which worked quite well.  Seizures were described as being generalized tonic-clonic with tongue bite and injury.  Tegretol was tapered off and she did well and remained seizure-free for for almost 10 years and started having nocturnal seizures from age 35.  The patient states she knows she is at a seizure because she wakes up with a bad headache and occasionally has tongue bites as well as feels muscles are sore and she is tired for the next day.  She was seen in the emergency room at [facility] where she lived for several times for the seizures.  She has moved to [place] 7 years ago and has seizures around 102/year.  She is has been seen Dr. Gerilyn Pilgrim.  He did a polysomnogram which was apparently unremarkable.  He started her on Keppra 500 mg twice daily which she is tolerating well without any side effects.  Her last seizure was 2 3 weeks ago and the EMS were called but by the time they arrived patient was recovering back to baseline and refused to go to the ER.  She states she is been compliant with her Keppra and has not missed a single dose.  She states she has been starting having headaches after seizures and at times even memory loss.  Seizures often triggered by stress and lack of sleep.  She had a  Mini-Mental status exam done today and scored   26 out of 30 with deficits in orientation and attention calculation and recall.  She is worried that her memory loss after seizure is a new finding.  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  I was unable to get records from Dr. Gerilyn Pilgrim, Dr. Sharene Skeans or any prior EEG studies for my review today  ROS:   14 system review of systems is positive for seizure, loss of consciousness, disorientation, tiredness confusion and all other systems negative  PMH:  Past Medical History:  Diagnosis Date  . Anemia   . History of medication noncompliance   . Seizures (HCC)    Since age 35 yrs    Social History:  Social History   Socioeconomic History  . Marital status: Divorced    Spouse name: Not on file  . Number of children: 2  . Years of education: some college  . Highest education level: Not on file  Occupational History  . Not on file  Tobacco Use  . Smoking status: Never Smoker  . Smokeless tobacco: Never Used  Vaping Use  . Vaping Use: Never used  Substance and Sexual Activity  . Alcohol use: No    Alcohol/week: 0.0 standard drinks  . Drug use: No  . Sexual activity:  Not Currently    Birth control/protection: I.U.D.  Other Topics Concern  . Not on file  Social History Narrative   Right handed   Soda: 2-6 per day   Works at [employer].   Married April 2020,second marriage.Lives with husband and 2 kids.      Social Determinants of Health   Financial Resource Strain: Not on file  Food Insecurity: Not on file  Transportation Needs: Not on file  Physical Activity: Not on file  Stress: Not on file  Social Connections: Not on file  Intimate Partner Violence: Not on file    Medications:   Current Outpatient Medications on File Prior to Visit  Medication Sig Dispense Refill  . cetirizine (ZYRTEC) 10 MG  tablet Take 1 tablet (10 mg total) by mouth daily. 30 tablet 11  . levonorgestrel (MIRENA) 20 MCG/24HR IUD 1 each by Intrauterine route once.     No current facility-administered medications on file prior to visit.    Allergies:  No Known Allergies  Physical Exam General: Morbidly obese young Caucasian lady, seated, in no evident distress Head: head normocephalic and atraumatic.   Neck: supple with no carotid or supraclavicular bruits Cardiovascular: regular rate and rhythm, no murmurs Musculoskeletal: no deformity Skin:  no rash/petichiae Vascular:  Normal pulses all extremities  Neurologic Exam Mental Status: Awake and fully alert. Oriented to place and time. Recent and remote memory intact. Attention span, concentration and fund of knowledge appropriate. Mood and affect appropriate.  Mini-Mental status exam score 26/30 with deficits in orientation, attention calculation and recall.  She had no trouble copying intersecting pentagons or with clock drawing. Cranial Nerves: Fundoscopic exam reveals sharp disc margins. Pupils equal, briskly reactive to light. Extraocular movements full without nystagmus. Visual fields full to confrontation. Hearing intact. Facial sensation intact. Face, tongue, palate moves normally and symmetrically.  Motor: Normal bulk and tone. Normal strength in all tested extremity muscles. Sensory.: intact to touch , pinprick , position and vibratory sensation.  Coordination: Rapid alternating movements normal in all extremities. Finger-to-nose and heel-to-shin performed accurately bilaterally. Gait and Station: Arises from chair without difficulty. Stance is normal. Gait demonstrates normal stride length and balance . Able to heel, toe and tandem walk without difficulty.  Reflexes: 1+ and symmetric. Toes downgoing.      ASSESSMENT: 35 year old Caucasian lady with with childhood history of generalized epilepsy which she had outgrown  but started having nocturnal  seizures for the last 10 to 12 years.     PLAN: I had a long discussion with the patient with regards to her longstanding history of nocturnal epilepsy and generalized seizures.  She is still having them on the current dose of Keppra and I recommend increasing the dose to 750 mg twice daily.  Check EEG and MRI scan of the brain with and without contrast.  She is having some memory and cognitive difficulties following her last seizure as well as some tension headaches.  Recommend checking memory panel labs and doing neck stretching exercises.  I also advised her to increase participation in stress laxation activities like meditation yoga and regular exercises.  She was advised to stay away from seizure provoking triggers like sleep deprivation, medication noncompliance, stimulants like alcohol, marijuana, substance abuse drugs extremes of dieting and physical activities.  She will return for follow-up in the future in 3 months or call earlier if necessary.  Greater than 50% time during this 45-minute consultation visit was spent in counseling and coordination of care about her nocturnal seizures  and discussion about tension headaches and memory loss and answering questions. Delia Heady, MD Note: This document was prepared with digital dictation and possible smart phrase technology. Any transcriptional errors that result from this process are unintentional.

## 2020-06-15 LAB — DEMENTIA PANEL
Homocysteine: 8.8 umol/L (ref 0.0–14.5)
RPR Ser Ql: NONREACTIVE
TSH: 1.93 u[IU]/mL (ref 0.450–4.500)
Vitamin B-12: 439 pg/mL (ref 232–1245)

## 2020-06-18 ENCOUNTER — Telehealth: Payer: Self-pay | Admitting: Neurology

## 2020-06-18 NOTE — Progress Notes (Signed)
Kindly inform the patient that lab work for reversible causes of memory loss was all normal

## 2020-06-18 NOTE — Telephone Encounter (Signed)
[facility] pending uploaded notes on the portal it can take up to 15 business days to hear back

## 2020-06-19 ENCOUNTER — Ambulatory Visit: Payer: Medicaid Other | Admitting: Neurology

## 2020-06-19 ENCOUNTER — Encounter: Payer: Self-pay | Admitting: *Deleted

## 2020-06-19 DIAGNOSIS — R413 Other amnesia: Secondary | ICD-10-CM

## 2020-06-19 DIAGNOSIS — R569 Unspecified convulsions: Secondary | ICD-10-CM

## 2020-06-21 NOTE — Telephone Encounter (Signed)
[facility] Berkley Harvey: [id number] (exp. 06/18/20 to 08/17/20) order sent to GI. They will reach out to the patient to schedule.

## 2020-06-25 ENCOUNTER — Other Ambulatory Visit: Payer: Self-pay

## 2020-06-25 ENCOUNTER — Ambulatory Visit
Admission: RE | Admit: 2020-06-25 | Discharge: 2020-06-25 | Disposition: A | Discharge status: Home / Self Care | Payer: Medicaid Other | Source: Ambulatory Visit | Attending: Neurology | Admitting: Neurology

## 2020-06-25 DIAGNOSIS — R569 Unspecified convulsions: Secondary | ICD-10-CM

## 2020-06-25 DIAGNOSIS — R413 Other amnesia: Secondary | ICD-10-CM

## 2020-06-25 MED ORDER — GADOBENATE DIMEGLUMINE 529 MG/ML IV SOLN
20.0000 mL | Freq: Once | INTRAVENOUS | Status: AC | PRN
  Administered 2020-06-25: 20 mL via INTRAVENOUS

## 2020-06-27 ENCOUNTER — Telehealth: Payer: Self-pay | Admitting: Neurology

## 2020-06-27 NOTE — Telephone Encounter (Signed)
Pt called stating that she does not know how to get in to her Mychart and has requested [place] to deactivate in and call her with results. Pt was informed of her Normal Lab results but she is still waiting for her MRI and EEG results. Please advise.

## 2020-07-02 NOTE — Telephone Encounter (Signed)
Pt called again and was informed that the provider is out of the office this week but will review her test results when he is back in the office.

## 2020-07-02 NOTE — Telephone Encounter (Signed)
Pt returned call. Please call back when available. 

## 2020-07-02 NOTE — Telephone Encounter (Signed)
LVM for return call.  Want to inform patient that Dr. Pearlean Brownie is out of the office this week and it may be next week before I have an answer on the his findings concerning her MRI and EEG.

## 2020-07-02 NOTE — Telephone Encounter (Signed)
LVM for patient to return call. 

## 2020-07-03 NOTE — Telephone Encounter (Signed)
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  No new or worrisome findings.  EEG has not been read yet and will call back with the results.

## 2020-07-04 NOTE — Telephone Encounter (Signed)
LVM for patient to return call. 

## 2020-07-04 NOTE — Telephone Encounter (Signed)
Pt returned call. Please call back when available. 

## 2020-07-04 NOTE — Telephone Encounter (Signed)
Returned patient's call and discussed Dr. Marlis Edelson findings regarding MRI.  Patient understands will call when EEG results are in.  Patient denied further questions, verbalized understanding and expressed appreciation for the phone call.

## 2020-07-13 NOTE — Progress Notes (Signed)
Kindly inform the patient that EEG study was normal

## 2020-07-13 NOTE — Progress Notes (Signed)
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  No new or worrisome finding

## 2020-07-16 ENCOUNTER — Telehealth: Payer: Self-pay | Admitting: Neurology

## 2020-07-16 NOTE — Telephone Encounter (Signed)
Called the patient to review the MRI results of the brain.  Advised that there were no changes from previous imaging completed in 2017.  Overall there was no new or worrisome findings on the imaging per Dr. Pearlean Brownie.  Also advised the patient that the EEG was normal and there was no signs of seizure-like activity.  Patient was appreciative for the information but is still concerned with why she is having memory issues as well as headache.  When questioned if the patient has an upcoming appointment.  It is not scheduled until July with Dr Pearlean Brownie.  Patient is asking to be seen sooner to address these concerns.  Was able to get the patient in with the nurse practitioner this week. Pt verbalized understanding. Pt had no questions at this time but was encouraged to call back if questions arise.

## 2020-07-16 NOTE — Telephone Encounter (Signed)
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  No new or worrisome finding

## 2020-07-18 ENCOUNTER — Ambulatory Visit: Payer: Medicaid Other | Admitting: Adult Health

## 2020-07-18 ENCOUNTER — Encounter: Payer: Self-pay | Admitting: Adult Health

## 2020-07-18 VITALS — BP 118/71 | HR 71 | Ht 62.0 in | Wt 263.0 lb

## 2020-07-18 DIAGNOSIS — R29818 Other symptoms and signs involving the nervous system: Secondary | ICD-10-CM | POA: Diagnosis not present

## 2020-07-18 DIAGNOSIS — G44209 Tension-type headache, unspecified, not intractable: Secondary | ICD-10-CM | POA: Diagnosis not present

## 2020-07-18 DIAGNOSIS — F419 Anxiety disorder, unspecified: Secondary | ICD-10-CM | POA: Diagnosis not present

## 2020-07-18 DIAGNOSIS — R413 Other amnesia: Secondary | ICD-10-CM

## 2020-07-18 DIAGNOSIS — F32A Depression, unspecified: Secondary | ICD-10-CM

## 2020-07-18 DIAGNOSIS — R569 Unspecified convulsions: Secondary | ICD-10-CM

## 2020-07-18 MED ORDER — TOPIRAMATE 25 MG PO TABS: 25.0000 mg | ORAL_TABLET | Freq: Two times a day (BID) | ORAL | 5 refills | Status: DC

## 2020-07-18 NOTE — Patient Instructions (Addendum)
Your Plan:  Start topamax 25mg  twice daily for headache prevention - after the weekend if you are still having headaches, let me know and we can discuss increasing dosage  Referral placed to behavioral health to help manage increased stress and underlying anxiety which may be largely contributing to your symptoms   Referral placed to our sleep clinic for possible underlying sleep apnea    Follow-up as scheduled with Dr.      Thank you for coming to see Pearlean Brownie at [facility]. I hope we have been able to provide you high quality care today.  You may receive a patient satisfaction survey over the next few weeks. We would appreciate your feedback and comments so that we may continue to improve ourselves and the health of our patients.

## 2020-07-18 NOTE — Progress Notes (Signed)
[facility] [address] [place]. [place] [address] [phone]       OFFICE FOLLOW UP NOTE  [name] Date of Birth:  [birth year] Medical Record Number:  [id number]   Referring MD: Lilly Cove Reason for Referral: Seizure  HPI:   Today, 07/18/2020, Mrs. Alexandra Garrett returns for sooner visit to review recent test results and continued memory concerns and headaches.  She is accompanied by her husband.  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  She is also been experiencing frequent headaches located bitemporally with constant pressure which can fluctuate throughout the day at times associated with photophobia and phonophobia.  Denies any visual changes.  At times memory can be worse with worsening headache.  She does admit to greatly increased stress levels as well as underlying untreated anxiety.  She will take Aleve frequently throughout the day without much benefit.  Reports sleeping well at night but does have daytime fatigue and husband reports snoring and witnessed apnea.  Thankfully, she has not had any additional seizures that she is aware of and has been tolerating Keppra 750 mg twice daily without side effects.  No further concerns at this time   History provided for reference purposes only Consult visit 06/14/2020 Dr. Pearlean Brownie: [name] is a 35 year old Caucasian lady seen today for initial office consultation visit for seizures.  History is obtained from the patient, review of electronic medical records and I  personally reviewed available imaging films in PACS.  Patient has past medical history of anemia, seizures.  Her seizures began at approximately age 21 and continued for around 10 years and stopped at age 62.  She was seen by Dr. Sharene Skeans in was on Tegretol which worked quite well.  Seizures were described as being generalized tonic-clonic with tongue bite and injury.  Tegretol was tapered off and she did well and remained seizure-free for for almost 10 years and started having nocturnal seizures from age 10.  The patient states she knows she is at a seizure because she wakes up with a bad headache and occasionally has tongue bites as well as feels muscles are sore and she is tired for the next day.  She was seen in the emergency room at [facility] where she lived for several times for the seizures.  She has moved to [place] 7 years ago and has seizures around 102/year.  She is has been seen Dr. Gerilyn Pilgrim.  He did a polysomnogram which was apparently unremarkable.  He started her on Keppra 500 mg twice daily which she is tolerating well without any side effects.  Her last seizure was 2 3 weeks ago and the EMS were called but by the time they arrived patient was recovering back to baseline and refused to go to the ER.  She states she is been compliant with her Keppra and has not missed a single dose.  She states she has been starting having headaches after seizures and at times even memory loss.  Seizures often triggered by stress and lack of sleep.  She had a Mini-Mental status exam done  today and scored   26 out of 30 with deficits in orientation and attention calculation and recall.  She is worried that her memory loss after seizure is a new finding.  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  I was unable to get records from Dr. Gerilyn Pilgrim, Dr. Sharene Skeans or any prior EEG studies for my review  today  ROS:   14 system review of systems is positive for those listed in HPI and all other systems negative  PMH:  Past Medical History:  Diagnosis Date  . Anemia   . History of medication noncompliance   . Seizures (HCC)    Since age 50 yrs    Social History:  Social History   Socioeconomic History  . Marital status: Divorced    Spouse name: Not on file  . Number of children: 2  . Years of education: some college  . Highest education level: Not on file  Occupational History  . Not on file  Tobacco Use  . Smoking status: Never Smoker  . Smokeless tobacco: Never Used  Vaping Use  . Vaping Use: Never used  Substance and Sexual Activity  . Alcohol use: No    Alcohol/week: 0.0 standard drinks  . Drug use: No  . Sexual activity: Not Currently    Birth control/protection: I.U.D.  Other Topics Concern  . Not on file  Social History Narrative   Right handed   Soda: 2-6 per day   Works at [employer].   Married April 2020,second marriage.Lives with husband and 2 kids.      Social Determinants of Health   Financial Resource Strain: Not on file  Food Insecurity: Not on file  Transportation Needs: Not on file  Physical Activity: Not on file  Stress: Not on file  Social Connections: Not on file  Intimate Partner Violence: Not on file    Medications:   Current Outpatient Medications on File Prior to Visit  Medication Sig Dispense Refill  . cetirizine (ZYRTEC) 10 MG tablet Take 1 tablet (10 mg total) by mouth daily. 30 tablet 11  . levETIRAcetam (KEPPRA) 750 MG tablet Take 1 tablet (750 mg total) by mouth 2 (two) times daily. 60 tablet 3  . levonorgestrel (MIRENA) 20 MCG/24HR IUD 1 each by Intrauterine route once.     No current facility-administered medications on file prior to visit.    Allergies:  No Known Allergies  Physical Exam General: Morbidly obese mildly anxious young Caucasian lady, seated, in no evident distress Head: head normocephalic and  atraumatic.   Neck: supple with no carotid or supraclavicular bruits Cardiovascular: regular rate and rhythm, no murmurs Musculoskeletal: no deformity Skin:  no rash/petichiae Vascular:  Normal pulses all extremities  Neurologic Exam Mental Status: Awake and fully alert. Oriented to place and time. Recent memory subjectively impaired and remote memory intact. Attention span, concentration and fund of knowledge appropriate during visit. Mood and affect appropriate.  Mini-Mental status exam score 28/30 with deficits in recall and attention/calculation.  She had no trouble copying intersecting pentagons or with clock drawing. MMSE - Mini Mental State Exam 07/18/2020 06/14/2020  Orientation to time 5 4  Orientation to Place 5 5  Registration 3 3  Attention/ Calculation 4 3  Recall 2 2  Language- name 2 objects 2 2  Language- repeat 1 1  Language- follow 3 step command 3 3  Language- read &  follow direction 1 1  Write a sentence 1 1  Copy design 1 1  Total score 28 26   Cranial Nerves: Pupils equal, briskly reactive to light. Extraocular movements full without nystagmus. Visual fields full to confrontation. Hearing intact. Facial sensation intact. Face, tongue, palate moves normally and symmetrically.  Motor: Normal bulk and tone. Normal strength in all tested extremity muscles. Sensory.: intact to touch , pinprick , position and vibratory sensation.  Coordination: Rapid alternating movements normal in all extremities. Finger-to-nose and heel-to-shin performed accurately bilaterally. Gait and Station: Arises from chair without difficulty. Stance is normal. Gait demonstrates normal stride length and balance . Able to heel, toe and tandem walk without difficulty.  Reflexes: 1+ and symmetric. Toes downgoing.      ASSESSMENT: 35 year old Caucasian lady with with childhood history of generalized epilepsy which she had outgrown  but started having nocturnal seizures for the last 10 to 12 years.   Also complains of short-term memory concerns and frequent tension type headaches     PLAN:  Nocturnal seizures -No additional seizures since prior visit -Continue Keppra 750 mg twice daily for seizure prevention -refill provided -EEG unremarkable -MR brain no acute abnormality; chronic T2/FLAIR hyperintense foci similar appearance from 2017 imaging likely in setting of very minimal chronic microvascular ischemic change or the sequela of migraine headaches as well as stable right posterior temporal developmental venous anomaly   Short-term memory loss -Greatest difficulty with short-term recall and focusing -high suspicion for underlying uncontrolled stressors and anxiety/depression contributing especially as dementia panel, EEG and MRI relatively unremarkable - referred to behavioral health.  Discussed importance of managing stressors  -MMSE today 28/30 (prior 26/30)  Tension type headaches -Present since recent seizure -Initiate topiramate 25 mg twice daily -Discussed possible underlying stressors possibly contributing to frequent headaches and again importance of managing stressors and stress relaxation techniques  Suspected sleep apnea -Referral placed to [facility] sleep clinic -Discussed importance of testing and treatment as untreated sleep apnea could contribute to memory loss concerns, headaches, anxiety/depression as well as possibly increasing risk of continued nocturnal seizures    F/u as scheduled with Dr. Pearlean Brownie in July   CC:  [facility] provider: Elenore Paddy, NP   Ihor Austin, AGNP-BC  [facility] [address] [place], [place] [address]  Phone [phone] Fax [phone] Note: This document was prepared with digital dictation and possible smart phrase technology. Any transcriptional errors that result from this process are unintentional.

## 2020-07-19 NOTE — Progress Notes (Signed)
I agree with the above plan 

## 2020-07-31 ENCOUNTER — Telehealth: Payer: Self-pay

## 2020-07-31 DIAGNOSIS — Z006 Encounter for examination for normal comparison and control in clinical research program: Secondary | ICD-10-CM

## 2020-07-31 NOTE — Telephone Encounter (Signed)
I called pt for her 90-day Identify Study follow up phone call. Pt is doing well. Pt stated she was still having chest pain at times with exertion. Pt has an upcoming appointment with her doctor on 08/16/2020. Pt stated she is also having nocturnal seizures. She is following up with her Neurologist for these symptoms.   I reminded pt I would call her in January for her 1 year follow-up.

## 2020-08-02 ENCOUNTER — Telehealth: Payer: Self-pay | Admitting: Adult Health

## 2020-08-02 MED ORDER — TOPIRAMATE 50 MG PO TABS: 50.0000 mg | ORAL_TABLET | Freq: Two times a day (BID) | ORAL | 5 refills | Status: DC

## 2020-08-02 MED ORDER — PROPRANOLOL HCL ER 60 MG PO CP24: 60.0000 mg | ORAL_CAPSULE | Freq: Every day | ORAL | 5 refills | Status: DC

## 2020-08-02 NOTE — Telephone Encounter (Signed)
Pt called to request increase topiramate (TOPAMAX) 25 MG tablet dosage. Still having headaches through out the day. Would like a call from the nurse

## 2020-08-02 NOTE — Telephone Encounter (Signed)
Recommend she continue topiramate 50 mg twice daily and start propranolol ER 60mg  nightly which can help with both headaches and anxiety.  Would recommend monitoring of blood pressure as this medication can decrease blood pressure and heart rate.

## 2020-08-02 NOTE — Addendum Note (Signed)
Addended by: Raliegh Ip on: 08/02/2020 04:25 PM   Modules accepted: Orders

## 2020-08-02 NOTE — Telephone Encounter (Signed)
I called patient.  She has not noted any decrease headaches taking Topamax 25 mg twice a day.  She did say that she did take 50 mg twice a day but it did not seem like it was consistent.  No side effects that she is aware of.  She did have a fall did not hurt herself other than sore and also has a sinus infection which could be related to some of the  off balance/ dizzy that she sort of feels.  I told her I would check with Shanda Bumps and then let her know prior to end of day.

## 2020-08-02 NOTE — Telephone Encounter (Signed)
I called patient.  I relayed Jessica's instructions of continuing Topamax 50 mg p p.o. twice daily and then also start propranolol extended release 60 mg tablets at bedtime patient can monitor her blood pressure on a daily basis I recommended in the morning an hour after she got up.  She does have a machine.  I noted side effect sometimes can have dizziness related to that so she will watch out for anything new that may appear after starting the medications and call us for questions.  New medications were called in or sent in by [facility]. Pt verbalized understanding.

## 2020-08-16 ENCOUNTER — Ambulatory Visit (INDEPENDENT_AMBULATORY_CARE_PROVIDER_SITE_OTHER): Payer: Medicaid Other | Admitting: Nurse Practitioner

## 2020-08-29 ENCOUNTER — Ambulatory Visit (INDEPENDENT_AMBULATORY_CARE_PROVIDER_SITE_OTHER): Payer: Medicaid Other | Admitting: Nurse Practitioner

## 2020-08-29 ENCOUNTER — Encounter (INDEPENDENT_AMBULATORY_CARE_PROVIDER_SITE_OTHER): Payer: Self-pay | Admitting: Nurse Practitioner

## 2020-08-29 ENCOUNTER — Other Ambulatory Visit: Payer: Self-pay

## 2020-08-29 ENCOUNTER — Telehealth (INDEPENDENT_AMBULATORY_CARE_PROVIDER_SITE_OTHER): Payer: Self-pay

## 2020-08-29 VITALS — BP 112/70 | HR 52 | Temp 98.2°F | Ht 62.0 in | Wt 258.8 lb

## 2020-08-29 DIAGNOSIS — F419 Anxiety disorder, unspecified: Secondary | ICD-10-CM | POA: Diagnosis not present

## 2020-08-29 DIAGNOSIS — Z6841 Body Mass Index (BMI) 40.0 and over, adult: Secondary | ICD-10-CM | POA: Diagnosis not present

## 2020-08-29 DIAGNOSIS — R5383 Other fatigue: Secondary | ICD-10-CM

## 2020-08-29 DIAGNOSIS — R569 Unspecified convulsions: Secondary | ICD-10-CM

## 2020-08-29 DIAGNOSIS — E559 Vitamin D deficiency, unspecified: Secondary | ICD-10-CM | POA: Diagnosis not present

## 2020-08-29 DIAGNOSIS — E66813 Obesity, class 3: Secondary | ICD-10-CM

## 2020-08-29 DIAGNOSIS — R079 Chest pain, unspecified: Secondary | ICD-10-CM | POA: Diagnosis not present

## 2020-08-29 DIAGNOSIS — E782 Mixed hyperlipidemia: Secondary | ICD-10-CM | POA: Diagnosis not present

## 2020-08-29 NOTE — Telephone Encounter (Signed)
Patient called and and left a detailed voice message that she forgot to show her legs to Maralyn Sago and she is having fluid retention. Patient wanted to know if she should send a picture to [place] to see.  I called patient back and left a detailed voice message that I discussed with Maralyn Sago and she wants to wait until tomorrow to see how her labs look and will call her with further advise depending on her lab results.

## 2020-08-29 NOTE — Progress Notes (Addendum)
Subjective:  Patient ID: Alexandra Garrett, female    DOB: 03/04/1986  Age: 35 y.o. MRN: [id number]  CC:  Chief Complaint  Patient presents with   Follow-up    Anxiety is getting worse and wants to see if her medication needs to be changed   Other    Vitamin D deficiency, seizures   Hyperlipidemia   Anxiety      HPI  This patient arrives today for the above.  Anxiety: She reports feeling anxiety is worsening.  She denies any suicidal ideations.  She tells me she is experience anxiety on a daily basis.  She does follow with neurology for treatment of headaches as well.  She is on topiramate and it was prescribed as twice a day, but she tells me she has not been taking this medication because she forgot that this was prescribed or that it was increased to twice a day dosing.  Vitamin D deficiency: She does have a history of vitamin D deficiency.  She is not on any supplement currently.  Seizures: She has a history of seizures and is being followed by neurology for this.  Her main concern is that she continues to have some memory loss especially difficulty with short-term memory since her last seizure.  So far work-up with neurology has been negative, they recommended she see psychiatry for assistance with managing her anxiety to see if this will help also with her memory.  Hyperlipidemia: She does have a history of hyperlipidemia and last LDL was collected about 1 year ago at that point it was 102.  Past Medical History:  Diagnosis Date   Anemia    History of medication noncompliance    Seizures ([place])    Since age 59 yrs      Family History  Problem Relation Age of Onset   Arthritis Mother    Depression Mother    Hypertension Mother    Breast cancer Mother        lymph nodes   Heart disease Father    Arthritis Maternal Grandmother    Cancer Maternal Grandmother        breast   Diabetes Maternal Grandmother    Cancer Maternal Grandfather        lung   Stroke  Paternal Grandmother    Heart disease Paternal Grandfather     Social History   Social History Narrative   Right handed   Soda: 2-6 per day   Works at [employer].   Married April 2020,second marriage.Lives with husband and 2 kids.      Social History   Tobacco Use   Smoking status: Never   Smokeless tobacco: Never  Substance Use Topics   Alcohol use: No    Alcohol/week: 0.0 standard drinks     Current Meds  Medication Sig   cetirizine (ZYRTEC) 10 MG tablet Take 1 tablet (10 mg total) by mouth daily.   levETIRAcetam (KEPPRA) 750 MG tablet Take 1 tablet (750 mg total) by mouth 2 (two) times daily.   levonorgestrel (MIRENA) 20 MCG/24HR IUD 1 each by Intrauterine route once.   propranolol ER (INDERAL LA) 60 MG 24 hr capsule Take 1 capsule (60 mg total) by mouth at bedtime.   topiramate (TOPAMAX) 50 MG tablet Take 1 tablet (50 mg total) by mouth 2 (two) times daily.    ROS:  Review of Systems  Respiratory:  Negative for shortness of breath.   Cardiovascular:  Positive for chest pain.  Neurological:  Positive for weakness and headaches.  Psychiatric/Behavioral:  Negative for suicidal ideas. The patient is nervous/anxious. The patient does not have insomnia.     Objective:   Today's Vitals: BP 112/70   Pulse (!) 52   Temp 98.2 F (36.8 C) (Temporal)   Ht _0  (1.575 m)   Wt 258 lb 12.8 oz (117.4 kg)   SpO2 97%   BMI 47.34 kg/m  Vitals with BMI 08/29/2020 07/18/2020 06/14/2020  Height _1  _2  _3   Weight 258 lbs 13 oz 263 lbs 257 lbs  BMI 47.32 67.61 95.09  Systolic 326 712 458  Diastolic 70 71 80  Pulse 52 71 57     Physical Exam Vitals reviewed.  Constitutional:      Appearance: Normal appearance.  HENT:     Head: Normocephalic and atraumatic.  Neck:     Vascular: No carotid bruit.  Cardiovascular:     Rate and Rhythm: Normal rate and regular rhythm.     Pulses: Normal pulses.     Heart sounds: Normal heart sounds. No murmur heard. Pulmonary:      Effort: Pulmonary effort is normal.     Breath sounds: Normal breath sounds.  Musculoskeletal:     Cervical back: Neck supple. No muscular tenderness.  Lymphadenopathy:     Cervical: No cervical adenopathy.  Skin:    General: Skin is warm and dry.  Neurological:     General: No focal deficit present.     Mental Status: She is alert and oriented to person, place, and time.     Motor: No weakness.     Gait: Gait normal.  Psychiatric:        Mood and Affect: Mood normal.        Behavior: Behavior normal.        Judgment: Judgment normal.       GAD 7 : Generalized Anxiety Score 08/29/2020  Nervous, Anxious, on Edge 3  Control/stop worrying 3  Worry too much - different things 3  Trouble relaxing 3  Restless 3  Easily annoyed or irritable 3  Afraid - awful might happen 3  Total GAD 7 Score 21      Assessment and Plan   1. Anxiety   2. Class 3 severe obesity without serious comorbidity with body mass index (BMI) of 45.0 to 49.9 in adult, unspecified obesity type ([place])   3. Mixed hyperlipidemia   4. Fatigue, unspecified type   5. Vitamin D deficiency   6. Seizures ([place])   7. Chest pain, unspecified type      Plan: 1., 7.  GAD-7 score is quite elevated.  She will follow-up in about 3 weeks after she starts taking the topiramate as currently prescribed.  If symptoms continue will consider trialing medication for treatment of her anxiety.  I have also referred her to psychiatry and virtual behavioral health counseling for assistance with managing her concerns.  During review of systems she did mention that she continues to have chest pain.  She tells me she has had work-up of this with cardiology and plans on following up with them. 2.  We will check blood work including A1c for further evaluation. 3.  We will check lipid panel today. 4.  We will check CBC. 5.  I encouraged her to consider restarting vitamin D3 supplement we will not check vitamin D level today as we know  is probably can be low as its been low in the past and she is  not on supplement currently. 6.  She will follow-up with neurology as scheduled.   Phone call: Patient called the office after her appointment had ended to notify the medical assistant that she has been having some swelling in her lower extremities.  I attempted to call her back to discuss further to determine neck steps, she cannot answer the phone by left her a voicemail asking her to call our office back.   Tests ordered Orders Placed This Encounter  Procedures   Lipid Panel   CMP with eGFR([facility])   Hemoglobin A1c   CBC   Ambulatory referral to Psychiatry      No orders of the defined types were placed in this encounter.   Patient to follow-up in 1 month or sooner as needed  Ailene Ards, NP

## 2020-08-30 ENCOUNTER — Other Ambulatory Visit (INDEPENDENT_AMBULATORY_CARE_PROVIDER_SITE_OTHER): Payer: Self-pay | Admitting: Nurse Practitioner

## 2020-08-30 DIAGNOSIS — R2243 Localized swelling, mass and lump, lower limb, bilateral: Secondary | ICD-10-CM

## 2020-08-30 LAB — COMPLETE METABOLIC PANEL WITH GFR
AG Ratio: 1.4 (calc) (ref 1.0–2.5)
ALT: 14 U/L (ref 6–29)
AST: 13 U/L (ref 10–30)
Albumin: 4.1 g/dL (ref 3.6–5.1)
Alkaline phosphatase (APISO): 75 U/L (ref 31–125)
BUN: 16 mg/dL (ref 7–25)
CO2: 21 mmol/L (ref 20–32)
Calcium: 8.9 mg/dL (ref 8.6–10.2)
Chloride: 108 mmol/L (ref 98–110)
Creat: 1.03 mg/dL (ref 0.50–1.10)
GFR, Est African American: 82 mL/min/{1.73_m2} (ref >=60)
GFR, Est Non African American: 70 mL/min/{1.73_m2} (ref >=60)
Globulin: 3 g/dL (calc) (ref 1.9–3.7)
Glucose, Bld: 84 mg/dL (ref 65–139)
Potassium: 3.9 mmol/L (ref 3.5–5.3)
Sodium: 139 mmol/L (ref 135–146)
Total Bilirubin: 0.4 mg/dL (ref 0.2–1.2)
Total Protein: 7.1 g/dL (ref 6.1–8.1)

## 2020-08-30 LAB — LIPID PANEL
Cholesterol: 184 mg/dL (ref <200)
HDL: 36 mg/dL — ABNORMAL LOW (ref >=50)
LDL Cholesterol (Calc): 126 mg/dL (calc) — ABNORMAL HIGH
Non-HDL Cholesterol (Calc): 148 mg/dL (calc) — ABNORMAL HIGH (ref <130)
Total CHOL/HDL Ratio: 5.1 (calc) — ABNORMAL HIGH (ref <5.0)
Triglycerides: 110 mg/dL (ref <150)

## 2020-08-30 LAB — CBC
HCT: 41.4 % (ref 35.0–45.0)
Hemoglobin: 13.5 g/dL (ref 11.7–15.5)
MCH: 28.4 pg (ref 27.0–33.0)
MCHC: 32.6 g/dL (ref 32.0–36.0)
MCV: 87.2 fL (ref 80.0–100.0)
MPV: 10.6 fL (ref 7.5–12.5)
Platelets: 349 10*3/uL (ref 140–400)
RBC: 4.75 10*6/uL (ref 3.80–5.10)
RDW: 12.8 % (ref 11.0–15.0)
WBC: 11.9 10*3/uL — ABNORMAL HIGH (ref 3.8–10.8)

## 2020-08-30 LAB — HEMOGLOBIN A1C
Hgb A1c MFr Bld: 5.3 % of total Hgb (ref <5.7)
Mean Plasma Glucose: 105 mg/dL
eAG (mmol/L): 5.8 mmol/L

## 2020-08-30 MED ORDER — FUROSEMIDE 20 MG PO TABS: 20.0000 mg | ORAL_TABLET | Freq: Every day | ORAL | 0 refills | Status: DC | PRN

## 2020-08-30 NOTE — Progress Notes (Signed)
I called this patient back to discuss her leg swelling.  She tells me for the last year so she has been having bilateral lower extremity swelling but this seems to have been getting progressively worse.  It is also painful bilaterally with the swelling.  She will come in today to have D-dimer drawn and we will start her on low-dose furosemide that she can take as needed for swelling.  She is going to follow-up next week for an office visit to discuss further.  May need to consider cardiac echocardiogram for further evaluation.  She also mentions that she has not fact been on the topiramate 50 mg twice a day for some time and she is wondering if she can take something else for anxiety.  I recommend she bring all of her medications with her to her next office visit to avoid any additional confusion regarding what medications she is or is not taking in we will discuss treatment options at that time.  She is agreeable to this.

## 2020-08-30 NOTE — Telephone Encounter (Signed)
Patient came into the office for labs today and let me know that she has spoken to Maralyn Sago and knows to return next week for visit. Patient verbalized an understanding.

## 2020-08-31 ENCOUNTER — Emergency Department (HOSPITAL_COMMUNITY): Payer: Medicaid Other

## 2020-08-31 ENCOUNTER — Emergency Department (HOSPITAL_COMMUNITY)
Admission: EM | Admit: 2020-08-31 | Discharge: 2020-08-31 | Disposition: A | Discharge status: Home / Self Care | Payer: Medicaid Other | Attending: Emergency Medicine | Admitting: Emergency Medicine

## 2020-08-31 ENCOUNTER — Encounter (HOSPITAL_COMMUNITY): Payer: Self-pay | Admitting: *Deleted

## 2020-08-31 ENCOUNTER — Other Ambulatory Visit: Payer: Self-pay

## 2020-08-31 DIAGNOSIS — R079 Chest pain, unspecified: Secondary | ICD-10-CM | POA: Insufficient documentation

## 2020-08-31 DIAGNOSIS — M7989 Other specified soft tissue disorders: Secondary | ICD-10-CM | POA: Diagnosis not present

## 2020-08-31 DIAGNOSIS — R600 Localized edema: Secondary | ICD-10-CM | POA: Diagnosis not present

## 2020-08-31 DIAGNOSIS — Z0389 Encounter for observation for other suspected diseases and conditions ruled out: Secondary | ICD-10-CM | POA: Diagnosis not present

## 2020-08-31 DIAGNOSIS — I517 Cardiomegaly: Secondary | ICD-10-CM | POA: Diagnosis not present

## 2020-08-31 DIAGNOSIS — R918 Other nonspecific abnormal finding of lung field: Secondary | ICD-10-CM | POA: Diagnosis not present

## 2020-08-31 LAB — CBC WITH DIFFERENTIAL/PLATELET
Abs Immature Granulocytes: 0.04 10*3/uL (ref 0.00–0.07)
Basophils Absolute: 0.1 10*3/uL (ref 0.0–0.1)
Basophils Relative: 1 %
Eosinophils Absolute: 0.3 10*3/uL (ref 0.0–0.5)
Eosinophils Relative: 2 %
HCT: 42.7 % (ref 36.0–46.0)
Hemoglobin: 13.6 g/dL (ref 12.0–15.0)
Immature Granulocytes: 0 %
Lymphocytes Relative: 36 %
Lymphs Abs: 4.5 10*3/uL — ABNORMAL HIGH (ref 0.7–4.0)
MCH: 28.7 pg (ref 26.0–34.0)
MCHC: 31.9 g/dL (ref 30.0–36.0)
MCV: 90.1 fL (ref 80.0–100.0)
Monocytes Absolute: 0.9 10*3/uL (ref 0.1–1.0)
Monocytes Relative: 7 %
Neutro Abs: 6.7 10*3/uL (ref 1.7–7.7)
Neutrophils Relative %: 54 %
Platelets: 347 10*3/uL (ref 150–400)
RBC: 4.74 MIL/uL (ref 3.87–5.11)
RDW: 14 % (ref 11.5–15.5)
WBC: 12.4 10*3/uL — ABNORMAL HIGH (ref 4.0–10.5)
nRBC: 0 % (ref 0.0–0.2)

## 2020-08-31 LAB — BRAIN NATRIURETIC PEPTIDE: B Natriuretic Peptide: 42 pg/mL (ref 0.0–100.0)

## 2020-08-31 LAB — COMPREHENSIVE METABOLIC PANEL
ALT: 19 U/L (ref 0–44)
AST: 16 U/L (ref 15–41)
Albumin: 4.1 g/dL (ref 3.5–5.0)
Alkaline Phosphatase: 79 U/L (ref 38–126)
Anion gap: 4 — ABNORMAL LOW (ref 5–15)
BUN: 17 mg/dL (ref 6–20)
CO2: 22 mmol/L (ref 22–32)
Calcium: 8.4 mg/dL — ABNORMAL LOW (ref 8.9–10.3)
Chloride: 109 mmol/L (ref 98–111)
Creatinine, Ser: 0.96 mg/dL (ref 0.44–1.00)
GFR, Estimated: >60 mL/min (ref >60)
Glucose, Bld: 98 mg/dL (ref 70–99)
Potassium: 3.7 mmol/L (ref 3.5–5.1)
Sodium: 135 mmol/L (ref 135–145)
Total Bilirubin: 0.5 mg/dL (ref 0.3–1.2)
Total Protein: 7.7 g/dL (ref 6.5–8.1)

## 2020-08-31 LAB — HCG, SERUM, QUALITATIVE: Preg, Serum: NEGATIVE

## 2020-08-31 LAB — TROPONIN I (HIGH SENSITIVITY)
Troponin I (High Sensitivity): 2 ng/L (ref <18)
Troponin I (High Sensitivity): 2 ng/L (ref <18)

## 2020-08-31 LAB — D-DIMER, QUANTITATIVE: D-Dimer, Quant: 0.84 mcg/mL FEU — ABNORMAL HIGH (ref <0.50)

## 2020-08-31 MED ORDER — IOHEXOL 350 MG/ML SOLN
100.0000 mL | Freq: Once | INTRAVENOUS | Status: AC | PRN
  Administered 2020-08-31: 100 mL via INTRAVENOUS

## 2020-08-31 NOTE — ED Notes (Signed)
Pt saying she hasn't ate today and is hungry and wants something- informed pt that the Dr has to give permission before I can give her anything.

## 2020-08-31 NOTE — ED Triage Notes (Signed)
Bilateral leg pain for quite a while

## 2020-08-31 NOTE — Discharge Instructions (Addendum)
Your work-up here was reassuring today.  Your heart enzymes were normal.  You did not have any fluid on your lungs.  The CT scan of your chest did not show any pneumonia, blood clots in your lungs or other abnormalities.  The ultrasounds of your legs did not show any evidence of a blood clot.  Your kidney and liver function were all normal.  I suspect that your swelling is likely due to something called dependent edema which is a benign cause.  Continue taking the Lasix that your primary care provider prescribed you.  You should wear compression stockings and keep legs elevated at night.  You should follow-up with your regular doctor in regards to your symptoms and return to the emergency department for any other new or worsening concerns.

## 2020-08-31 NOTE — ED Notes (Signed)
Pt back from CT- lab to bedside to draw troponin

## 2020-08-31 NOTE — ED Provider Notes (Signed)
[facility] EMERGENCY DEPARTMENT Provider Note   CSN: [id number] Arrival date & time: 08/31/20  1703     History Chief Complaint  Patient presents with   Leg Pain    Alexandra [employer] is a 35 y.o. female.  HPI   Pt is a 35 y/o female with a h/o anemia, seizure who presents to the ED today for eval of ble swelling that started a few months ago. Sxs have been worsening since onset. She notes that the swelling is worse at the end of the day and upon waking in the morning or staying off of her legs the sxs improve and the swelling goes down.   She reports some intermittent chest discomfort that is intermittent in nature and has been ongoing for at least the last 6 months. She was referred to cardiology and had a coronary CTA which was negative for any CAD. There was no noted PE at that time. She denies any sob.   She is currently on birth control. Denies hemoptysis, recent surgery/trauma, recent long travel, personal hx of cancer, or hx of DVT/PE.   She was evaluated by her pcp earlier today and started on lasix. She also had a ddimer ordered which was apparently positive so was sent here for further eval.     Past Medical History:  Diagnosis Date   Anemia    History of medication noncompliance    Seizures (HCC)    Since age 47 yrs    Patient Active Problem List   Diagnosis Date Noted   Anxiety 04/21/2017   Low vitamin B12 level 04/21/2017   Class 3 severe obesity due to excess calories with serious comorbidity and body mass index (BMI) of 45.0 to 49.9 in adult [facility]) 04/21/2017   Vitamin D deficiency 04/21/2017   Mixed hyperlipidemia 04/21/2017   Encounter for IUD insertion 12/19/2015   Seizures (HCC)     Past Surgical History:  Procedure Laterality Date   NO PAST SURGERIES       OB History     Gravida      Para      Term      Preterm      AB      Living  2      SAB      IAB      Ectopic      Multiple      Live Births              Family  History  Problem Relation Age of Onset   Arthritis Mother    Depression Mother    Hypertension Mother    Breast cancer Mother        lymph nodes   Heart disease Father    Arthritis Maternal Grandmother    Cancer Maternal Grandmother        breast   Diabetes Maternal Grandmother    Cancer Maternal Grandfather        lung   Stroke Paternal Grandmother    Heart disease Paternal Grandfather     Social History   Tobacco Use   Smoking status: Never   Smokeless tobacco: Never  Vaping Use   Vaping Use: Never used  Substance Use Topics   Alcohol use: No    Alcohol/week: 0.0 standard drinks   Drug use: No    Home Medications Prior to Admission medications   Medication Sig Start Date End Date Taking? Authorizing Provider  cetirizine (ZYRTEC) 10 MG tablet Take 1  tablet (10 mg total) by mouth daily. 06/20/19  Yes Bing NeighborsHarris, Kimberly S, FNP  Cholecalciferol (VITAMIN D3) 25 MCG (1000 UT) CAPS Take 1 capsule by mouth daily.   Yes [provider]  levETIRAcetam (KEPPRA) 750 MG tablet Take 1 tablet (750 mg total) by mouth 2 (two) times daily. 06/14/20  Yes Micki RileySethi, Pramod S, MD  levonorgestrel (MIRENA) 20 MCG/24HR IUD 1 each by Intrauterine route once.   Yes [provider]  propranolol ER (INDERAL LA) 60 MG 24 hr capsule Take 1 capsule (60 mg total) by mouth at bedtime. 08/02/20  Yes McCue, Shanda BumpsJessica, NP  topiramate (TOPAMAX) 50 MG tablet Take 1 tablet (50 mg total) by mouth 2 (two) times daily. 08/02/20  Yes McCue, Shanda BumpsJessica, NP  furosemide (LASIX) 20 MG tablet Take 1 tablet (20 mg total) by mouth daily as needed for fluid. Patient not taking: No sig reported 08/30/20   Elenore PaddyGray, Sarah E, NP  topiramate (TOPAMAX) 25 MG tablet Take 25 mg by mouth 2 (two) times daily. Patient not taking: No sig reported 08/28/20   [provider]    Allergies    Patient has no known allergies.  Review of Systems   Review of Systems  Constitutional:  Negative for chills and fever.  HENT:   Negative for ear pain and sore throat.   Eyes:  Negative for pain and visual disturbance.  Respiratory:  Negative for cough and shortness of breath.   Cardiovascular:  Positive for chest pain and leg swelling.  Gastrointestinal:  Negative for abdominal pain and vomiting.  Genitourinary:  Negative for dysuria and hematuria.  Musculoskeletal:  Negative for arthralgias and back pain.  Skin:  Negative for color change and rash.  Neurological:  Negative for seizures and syncope.  All other systems reviewed and are negative.  Physical Exam Updated Vital Signs BP 122/75   Pulse (!) 49   Temp 98 F (36.7 C) (Oral)   Resp 19   SpO2 100%   Physical Exam Vitals and nursing note reviewed.  Constitutional:      General: She is not in acute distress.    Appearance: She is well-developed.  HENT:     Head: Normocephalic and atraumatic.  Eyes:     Conjunctiva/sclera: Conjunctivae normal.  Cardiovascular:     Rate and Rhythm: Normal rate and regular rhythm.     Heart sounds: Normal heart sounds. No murmur heard. Pulmonary:     Effort: Pulmonary effort is normal. No respiratory distress.     Breath sounds: Normal breath sounds. No wheezing, rhonchi or rales.  Abdominal:     General: Bowel sounds are normal.     Palpations: Abdomen is soft.     Tenderness: There is no abdominal tenderness.  Musculoskeletal:        General: No tenderness.     Cervical back: Neck supple.     Right lower leg: No edema.     Left lower leg: No edema.  Skin:    General: Skin is warm and dry.  Neurological:     Mental Status: She is alert.    ED Results / Procedures / Treatments   Labs (all labs ordered are listed, but only abnormal results are displayed) Labs Reviewed  CBC WITH DIFFERENTIAL/PLATELET - Abnormal; Notable for the following components:      Result Value   WBC 12.4 (*)    Lymphs Abs 4.5 (*)    All other components within normal limits  COMPREHENSIVE METABOLIC PANEL - Abnormal; Notable  for  the following components:   Calcium 8.4 (*)    Anion gap 4 (*)    All other components within normal limits  HCG, SERUM, QUALITATIVE  BRAIN NATRIURETIC PEPTIDE  TROPONIN I (HIGH SENSITIVITY)  TROPONIN I (HIGH SENSITIVITY)    EKG EKG Interpretation  Date/Time:  Friday August 31 2020 19:55:22 EDT Ventricular Rate:  51 PR Interval:  153 QRS Duration: 104 QT Interval:  463 QTC Calculation: 427 R Axis:   -11 Text Interpretation: Sinus rhythm Confirmed by Alvester Chou [phone]) on 08/31/2020 9:19:12 PM  Radiology CT Angio Chest PE W and/or Wo Contrast  Result Date: 08/31/2020 CLINICAL DATA:  Suspected embolism in a 35 year old female. EXAM: CT ANGIOGRAPHY CHEST WITH CONTRAST TECHNIQUE: Multidetector CT imaging of the chest was performed using the standard protocol during bolus administration of intravenous contrast. Multiplanar CT image reconstructions and MIPs were obtained to evaluate the vascular anatomy. CONTRAST:  OMNIPAQUE IOHEXOL 350 MG/ML SOLN COMPARISON:  Cardiac CT from January of 2022. FINDINGS: Cardiovascular: Heart size is stable. Mild enlargement without pericardial effusion. The aortic caliber and contour is normal. No signs of pulmonary embolism. Central pulmonary vasculature at 344 Hounsfield units showing adequate opacification of the pulmonary vascular bed. Mediastinum/Nodes: Esophagus grossly normal. No axillary lymphadenopathy. No mediastinal lymphadenopathy. No hilar adenopathy. Lungs/Pleura: No consolidation. No pleural effusion. Airways are patent. Upper Abdomen: Incidental imaging of upper abdominal contents is unremarkable. Imaged portions of liver, spleen, pancreas, adrenals and kidneys as well as limited assessment of the upper abdomen, gastrointestinal tract without acute process. Musculoskeletal: No chest wall mass. No acute bone finding. No destructive bone process. Review of the MIP images confirms the above findings. IMPRESSION: 1. Negative for pulmonary  embolism. 2. No acute cardiopulmonary process. Electronically Signed   By: Donzetta Kohut M.D.   On: 08/31/2020 21:25   US Venous Img Lower Bilateral  Result Date: 08/31/2020 CLINICAL DATA:  Leg swelling. EXAM: BILATERAL LOWER EXTREMITY VENOUS DOPPLER ULTRASOUND TECHNIQUE: Gray-scale sonography with compression, as well as color and duplex ultrasound, were performed to evaluate the deep venous system(s) from the level of the common femoral vein through the popliteal and proximal calf veins. COMPARISON:  None. FINDINGS: VENOUS Normal compressibility of the common femoral, superficial femoral, and popliteal veins, as well as the visualized calf veins. Visualized portions of profunda femoral vein and great saphenous vein unremarkable. No filling defects to suggest DVT on grayscale or color Doppler imaging. Doppler waveforms show normal direction of venous flow, normal respiratory plasticity and response to augmentation. OTHER None. Limitations: none IMPRESSION: No evidence of bilateral lower extremity DVT. Electronically Signed   By: Narda Rutherford M.D.   On: 08/31/2020 18:45    Procedures Procedures   Medications Ordered in ED Medications  iohexol (OMNIPAQUE) 350 MG/ML injection 100 mL (100 mLs Intravenous Contrast Given 08/31/20 2056)    ED Course  I have reviewed the triage vital signs and the nursing notes.  Pertinent labs & imaging results that were available during my care of the patient were reviewed by me and considered in my medical decision making (see chart for details).    MDM Rules/Calculators/A&P                          35 y/o female presenting for eval of ble edema. She was sent here for further eval after having a positive ddimer as an outpatient.   BLE [place] were neg for DVT.   On further hx she  c/o chest pain as well. Given positive ddimer and hx birth control use, added cta chest and labs  Reviewed/interpreted labs CBC with mild leukocytosis w/o anemia CMP  unremarkable BNP neg Trop neg Serum qual neg  Reviewed/interpreted labs CTA chest - neg  Patient's work-up here is reassuring.  I suspect that her symptoms are related to dependent edema.  Advised on compression stockings.  She is already been given Rx for Lasix by her PCP.  Advised on follow-up and return precautions.  She voices understanding of plan and reasons to return.  Questions answered.  Patient stable for discharge  Final Clinical Impression(s) / ED Diagnoses Final diagnoses:  Leg swelling    Rx / DC Orders ED Discharge Orders     None        Rayne Du 08/31/20 2319    Terald Sleeper, MD 09/01/20 1125

## 2020-08-31 NOTE — ED Provider Notes (Signed)
Emergency Medicine Provider Triage Evaluation Note  Alexandra Garrett , a 35 y.o. female  was evaluated in triage.  Pt complains of She was sent in for a leg ultrasound.  She had an elevated d-dimer and leg and chest pain.  This has been on and off for about a month and worsening.  Her pain and swelling is in both legs.  She reports chest pain that starts as she gets more active.  No shortness of breath, no fevers.   Review of Systems  Positive: Chest pressure, leg swelling,  Negative: Fevers, N/V.  Physical Exam  BP 108/62   Pulse (!) 49   Temp 98 F (36.7 C) (Oral)   Resp 20   SpO2 100%  Gen:   Awake, no distress   Resp:  Normal effort  MSK:   Moves extremities without difficulty  Other:  Patient is awake and alert.   Medical Decision Making  Medically screening exam initiated at 6:48 PM.  Appropriate orders placed.  DERRICA SIEG was informed that the remainder of the evaluation will be completed by another provider, this initial triage assessment does not replace that evaluation, and the importance of remaining in the ED until their evaluation is complete.     Cristina Gong, [place] 08/31/20 1851    Terald Sleeper, MD 09/01/20 1125

## 2020-08-31 NOTE — ED Notes (Signed)
Pt in CT at this time.

## 2020-09-03 ENCOUNTER — Telehealth: Payer: Self-pay

## 2020-09-03 NOTE — Telephone Encounter (Signed)
Transition Care Management Unsuccessful Follow-up Telephone Call  Date of discharge and from where:  08/31/2020 from [place]  Attempts:  1st Attempt  Reason for unsuccessful TCM follow-up call:  Left voice message    

## 2020-09-04 NOTE — Telephone Encounter (Signed)
Transition Care Management Unsuccessful Follow-up Telephone Call  Date of discharge and from where:  08/31/2020 from [place]  Attempts:  2nd Attempt  Reason for unsuccessful TCM follow-up call:  Left voice message    

## 2020-09-05 NOTE — Telephone Encounter (Signed)
Transition Care Management Unsuccessful Follow-up Telephone Call  Date of discharge and from where:  08/31/2020 from [place]  Attempts:  3rd Attempt  Reason for unsuccessful TCM follow-up call:  Unable to reach patient    

## 2020-09-06 ENCOUNTER — Ambulatory Visit (INDEPENDENT_AMBULATORY_CARE_PROVIDER_SITE_OTHER): Payer: Medicaid Other | Admitting: Nurse Practitioner

## 2020-09-07 ENCOUNTER — Other Ambulatory Visit (INDEPENDENT_AMBULATORY_CARE_PROVIDER_SITE_OTHER): Payer: Self-pay | Admitting: Internal Medicine

## 2020-09-07 DIAGNOSIS — R569 Unspecified convulsions: Secondary | ICD-10-CM

## 2020-09-11 ENCOUNTER — Telehealth: Payer: Self-pay | Admitting: Licensed Clinical Social Worker

## 2020-09-11 ENCOUNTER — Telehealth (INDEPENDENT_AMBULATORY_CARE_PROVIDER_SITE_OTHER): Payer: Self-pay

## 2020-09-11 NOTE — Telephone Encounter (Signed)
Patient called and left a detailed voice message on Thursday, 09/06/2020 and stated that she was not able to make her appointment as they called in Hospice for her mother in law and only gave her 24 hours to live.  I called patient back and she stated that her mother in law did pass away and they had the funeral this weekend. Patient stated that she did not get the Lasix until recently due to everything that happened and she will keep her scheduled appointment on 09/25/2020 and if she needs an acute visit she will call us if she needs to. Patient verbalized an understanding and I gave patient our condolences.

## 2020-09-11 NOTE — Telephone Encounter (Signed)
Left message encouraging call back °

## 2020-09-18 ENCOUNTER — Ambulatory Visit: Payer: Medicaid Other | Admitting: Neurology

## 2020-09-18 ENCOUNTER — Encounter: Payer: Self-pay | Admitting: Neurology

## 2020-09-18 VITALS — BP 112/64 | HR 50 | Ht 62.0 in | Wt 264.2 lb

## 2020-09-18 DIAGNOSIS — G4719 Other hypersomnia: Secondary | ICD-10-CM

## 2020-09-18 DIAGNOSIS — R351 Nocturia: Secondary | ICD-10-CM | POA: Diagnosis not present

## 2020-09-18 DIAGNOSIS — Z820 Family history of epilepsy and other diseases of the nervous system: Secondary | ICD-10-CM | POA: Diagnosis not present

## 2020-09-18 DIAGNOSIS — R569 Unspecified convulsions: Secondary | ICD-10-CM | POA: Diagnosis not present

## 2020-09-18 DIAGNOSIS — Z6841 Body Mass Index (BMI) 40.0 and over, adult: Secondary | ICD-10-CM

## 2020-09-18 DIAGNOSIS — R0681 Apnea, not elsewhere classified: Secondary | ICD-10-CM | POA: Diagnosis not present

## 2020-09-18 DIAGNOSIS — R519 Headache, unspecified: Secondary | ICD-10-CM

## 2020-09-18 DIAGNOSIS — R0683 Snoring: Secondary | ICD-10-CM | POA: Diagnosis not present

## 2020-09-18 NOTE — Progress Notes (Signed)
Subjective:    Patient ID: Alexandra Garrett is a 35 y.o. female.  HPI    Alexandra Foley, MD, PhD [facility] [address] [address] [place], [place] [address]  Dear Alexandra Garrett and Alexandra Garrett,   I saw your patient, Alexandra Garrett, upon your kind request in my sleep clinic today for initial consultation of her sleep disorder, in particular, concern for underlying obstructive sleep apnea.  The patient is accompanied by her husband today.  As you know, Alexandra Garrett is a 35 year old right-handed woman with an underlying medical history of anemia, seizure disorder, memory loss, and severe obesity with a BMI of over 45, who reports snoring and excessive daytime somnolence.  I reviewed your office note 01/2021.  Her Epworth sleepiness score is 18 out of 24, fatigue severity score is 49 out of 63.  Her husband has noted pauses in her breathing and more deeper breaths afterwards.  Her father has sleep apnea and has a CPAP machine and she is familiar with the diagnosis of sleep apnea as her husband is also on treatment.  She lives with her husband and her 2 sons, ages 54 and 41.  She works full-time for [employer] but has a variable work schedule.  Her bedtime is generally between 9 PM and 10:30 PM and rise time between 430 and 6:30 AM depending on her work schedule.  She has woken up with a headache.  She is suspected to have nocturnal seizures.  She has nocturia about once per average night.  She drinks caffeine in the form of diet soda, about 3-4 servings per day.  They have 2 birds in the household, no four-legged pets.  He has also noted that she wakes up with a cough at night after a breathing pauses.  Her weight has been fluctuating.  Of note, she had a sleep study in the past which was interpreted by Dr. Gerilyn Garrett.  I was able to review the test results.  She had a sleep study at [facility] on 11/30/2015.  Her BMI was 37 at the time, weight documented at 200 pounds.  Her AHI  was 0.6/h, O2 nadir 91%.  She has had interim weight gain.  She does not smoke or drink alcohol.  Her Past Medical History Is Significant For: Past Medical History:  Diagnosis Date   Anemia    History of medication noncompliance    Seizures (HCC)    Since age 35 yrs    Her Past Surgical History Is Significant For: Past Surgical History:  Procedure Laterality Date   NO PAST SURGERIES      Her Family History Is Significant For: Family History  Problem Relation Age of Onset   Arthritis Mother    Depression Mother    Hypertension Mother    Breast cancer Mother        lymph nodes   Heart disease Father    Arthritis Maternal Grandmother    Cancer Maternal Grandmother        breast   Diabetes Maternal Grandmother    Cancer Maternal Grandfather        lung   Stroke Paternal Grandmother    Heart disease Paternal Grandfather     Her Social History Is Significant For: Social History   Socioeconomic History   Marital status: Divorced    Spouse name: Not on file   Number of children: 2   Years of education: some college   Highest education level: Not on file  Occupational History   Not on file  Tobacco Use   Smoking status: Never   Smokeless tobacco: Never  Vaping Use   Vaping Use: Never used  Substance and Sexual Activity   Alcohol use: No    Alcohol/week: 0.0 standard drinks   Drug use: No   Sexual activity: Not Currently    Birth control/protection: I.U.D.  Other Topics Concern   Not on file  Social History Narrative   Right handed   Soda: 2-6 per day   Works at [employer].   Married April 2020,second marriage.Lives with husband and 2 kids.      Social Determinants of Health   Financial Resource Strain: Not on file  Food Insecurity: Not on file  Transportation Needs: Not on file  Physical Activity: Not on file  Stress: Not on file  Social Connections: Not on file    Her Allergies Are:  No Known Allergies:   Her Current Medications Are:   Outpatient Encounter Medications as of 09/18/2020  Medication Sig   cetirizine (ZYRTEC) 10 MG tablet Take 1 tablet (10 mg total) by mouth daily.   Cholecalciferol (VITAMIN D3) 25 MCG (1000 UT) CAPS Take 1 capsule by mouth daily.   furosemide (LASIX) 20 MG tablet Take 1 tablet (20 mg total) by mouth daily as needed for fluid.   levETIRAcetam (KEPPRA) 750 MG tablet Take 1 tablet (750 mg total) by mouth 2 (two) times daily.   levonorgestrel (MIRENA) 20 MCG/24HR IUD 1 each by Intrauterine route once.   propranolol ER (INDERAL LA) 60 MG 24 hr capsule Take 1 capsule (60 mg total) by mouth at bedtime.   topiramate (TOPAMAX) 50 MG tablet Take 1 tablet (50 mg total) by mouth 2 (two) times daily.   [DISCONTINUED] topiramate (TOPAMAX) 25 MG tablet Take 25 mg by mouth 2 (two) times daily.   No facility-administered encounter medications on file as of 09/18/2020.  :   Review of Systems:  Out of a complete 14 point review of systems, all are reviewed and negative with the exception of these symptoms as listed below:  Review of Systems  Neurological:        Pt presents today for sleep study due to nocturnal sz, snoring and memory loss. Pts husband states she will take a deep breath and starts to coughing and gasp for air.    Objective:  Neurological Exam  Physical Exam Physical Examination:   Vitals:   09/18/20 1042  BP: 112/64  Pulse: (!) 50    General Examination: The patient is a very pleasant 35 y.o. female in no acute distress. She appears well-developed and well-nourished and well groomed.   HEENT: Normocephalic, atraumatic, pupils are equal, round and reactive to light, extraocular tracking is good without limitation to gaze excursion or nystagmus noted. Hearing is grossly intact. Face is symmetric with normal facial animation. Speech is clear with no dysarthria noted. There is no hypophonia. There is no lip, neck/head, jaw or voice tremor. Neck is supple with full range of passive and  active motion. There are no carotid bruits on auscultation. Oropharynx exam reveals: mild mouth dryness, adequate dental hygiene and mild airway crowding, due to small airway entry, Mallampati class I.  Tonsils on the smaller side, tongue protrudes centrally and palate elevates symmetrically.  Neck circumference of 17 1/8 inches.  She has a minimal overbite.  Nasal inspection shows no significant nasal septal deviation or inferior turbinate hypertrophy, small nasal passages noted.  Chest: Clear to auscultation without wheezing,  rhonchi or crackles noted.  Heart: S1+S2+0, regular and normal without murmurs, rubs or gallops noted.   Abdomen: Soft, non-tender and non-distended with normal bowel sounds appreciated on auscultation.  Extremities: There is no pitting edema in the distal lower extremities bilaterally.   Skin: Warm and dry without trophic changes noted.   Musculoskeletal: exam reveals no obvious joint deformities, tenderness or joint swelling or erythema.   Neurologically:  Mental status: The patient is awake, alert and oriented in all 4 spheres. Her immediate and remote memory, attention, language skills and fund of knowledge are appropriate. Thought process is linear. Mood is normal and affect is normal.  Cranial nerves II - XII are as described above under HEENT exam.  Motor exam: Normal bulk, strength and tone is noted. There is no tremor, Romberg is negative. Fine motor skills and coordination: grossly intact.  Cerebellar testing: No dysmetria or intention tremor. There is no truncal or gait ataxia.  Sensory exam: intact to light touch in the upper and lower extremities.  Gait, station and balance: She stands easily. No veering to one side is noted. No leaning to one side is noted. Posture is age-appropriate and stance is narrow based. Gait shows normal stride length and normal pace. No problems turning are noted. Tandem walk is unremarkable.                Assessment and Plan:    In summary, Alexandra Garrett is a very pleasant 35 y.o.-year old female with an underlying medical history of anemia, seizure disorder, memory loss, and severe obesity with a BMI of over 45, whose history and physical exam are concerning for obstructive sleep apnea (OSA). I had a long chat with the patient and her husband about my findings and the diagnosis of OSA, its prognosis and treatment options. We talked about medical treatments, surgical interventions and non-pharmacological approaches. I explained in particular the risks and ramifications of untreated moderate to severe OSA, especially with respect to developing cardiovascular disease down the Road, including congestive heart failure, difficult to treat hypertension, cardiac arrhythmias, or stroke. Even type 2 diabetes has, in part, been linked to untreated OSA. Symptoms of untreated OSA include daytime sleepiness, memory problems, mood irritability and mood disorder such as depression and anxiety, lack of energy, as well as recurrent headaches, especially morning headaches. We talked about trying to maintain a healthy lifestyle in general, as well as the importance of weight control. We also talked about the importance of good sleep hygiene. I recommended the following at this time: sleep study.  A laboratory attended sleep study would be preferred given her concern for nocturnal seizures.  I explained to them the difference between a home sleep test and a laboratory sleep study.  I explained the sleep test procedure to the patient and also outlined possible surgical and non-surgical treatment options of OSA, including the use of a custom-made dental device (which would require a referral to a specialist dentist or oral surgeon), upper airway surgical options, such as traditional UPPP or a novel less invasive surgical option in the form of Inspire hypoglossal nerve stimulation (which would involve a referral to an ENT surgeon). I also explained the  CPAP treatment option to the patient, who indicated that she would be willing to try CPAP if the need arises. I explained the importance of being compliant with PAP treatment, not only for insurance purposes but primarily to improve Her symptoms, and for the patient's long term health benefit, including to reduce  Her cardiovascular risks. I answered all their questions today and the patient and her husband were in agreement. I plan to see her back after the sleep study is completed and encouraged them to call with any interim questions, concerns, problems or updates.   Thank you very much for allowing me to participate in the care of this nice patient. If I can be of any further assistance to you please do not hesitate to talk to me.  Sincerely,   Alexandra FoleySaima Honi Name, MD, PhD

## 2020-09-18 NOTE — Patient Instructions (Signed)
It was nice to meet you today. Based on your symptoms and your exam I believe you are at risk for obstructive sleep apnea (aka OSA), and I think we should proceed with a sleep study to determine whether you do or do not have OSA and how severe it is. Even, if you have mild OSA, I may want you to consider treatment with CPAP, as treatment of even borderline or mild sleep apnea can result and improvement of symptoms such as sleep disruption, daytime sleepiness, nighttime bathroom breaks, restless leg symptoms, improvement of headache syndromes, even improved mood disorder.   As explained, an attended sleep study meaning you get to stay overnight in the sleep lab, lets us monitor sleep-related behaviors such as sleep talking and leg movements in sleep, in addition to monitoring for sleep apnea.  A home sleep test is a screening tool for sleep apnea only, and unfortunately does not help with any other sleep-related diagnoses.  Please remember, the long-term risks and ramifications of untreated moderate to severe obstructive sleep apnea are: increased Cardiovascular disease, including congestive heart failure, stroke, difficult to control hypertension, treatment resistant obesity, arrhythmias, especially irregular heartbeat commonly known as A. Fib. (atrial fibrillation); even type 2 diabetes has been linked to untreated OSA.   Sleep apnea can cause disruption of sleep and sleep deprivation in most cases, which, in turn, can cause recurrent headaches, problems with memory, mood, concentration, focus, and vigilance. Most people with untreated sleep apnea report excessive daytime sleepiness, which can affect their ability to drive. Please do not drive if you feel sleepy. Patients with sleep apnea can also develop difficulty initiating and maintaining sleep (aka insomnia).   Having sleep apnea may increase your risk for other sleep disorders, including involuntary behaviors sleep such as sleep terrors, sleep  talking, sleepwalking.    Having sleep apnea can also increase your risk for restless leg syndrome and leg movements at night.   Please note that untreated obstructive sleep apnea may carry additional perioperative morbidity. Patients with significant obstructive sleep apnea (typically, in the moderate to severe degree) should receive, if possible, perioperative PAP (positive airway pressure) therapy and the surgeons and particularly the anesthesiologists should be informed of the diagnosis and the severity of the sleep disordered breathing.   I will likely see you back after your sleep study to go over the test results and where to go from there. We will call you after your sleep study to advise about the results (most likely, you will hear from Megan, my nurse) and to set up an appointment at the time, as necessary.    Our sleep lab administrative assistant will call you to schedule your sleep study and give you further instructions, regarding the check in process for the sleep study, arrival time, what to bring, when you can expect to leave after the study, etc., and to answer any other logistical questions you may have. If you don't hear back from her by about 2 weeks from now, please feel free to call her direct line at [phone] or you can call our general clinic number, or email us through My Chart.    

## 2020-09-25 ENCOUNTER — Ambulatory Visit (INDEPENDENT_AMBULATORY_CARE_PROVIDER_SITE_OTHER): Payer: Medicaid Other | Admitting: Nurse Practitioner

## 2020-10-01 ENCOUNTER — Ambulatory Visit: Payer: Medicaid Other | Admitting: Neurology

## 2020-10-03 ENCOUNTER — Other Ambulatory Visit (INDEPENDENT_AMBULATORY_CARE_PROVIDER_SITE_OTHER): Payer: Self-pay | Admitting: Nurse Practitioner

## 2020-10-03 DIAGNOSIS — R2243 Localized swelling, mass and lump, lower limb, bilateral: Secondary | ICD-10-CM

## 2020-10-09 ENCOUNTER — Ambulatory Visit: Payer: Medicaid Other | Admitting: Student

## 2020-10-09 NOTE — Progress Notes (Deleted)
Cardiology Office Note    Date:  10/09/2020   ID:  Alexandra Garrett, DOB 27-Feb-1986, MRN [id number]  PCP:  Alexandra Paddy, NP  Cardiologist: Alexandra Dell, MD    No chief complaint on file.   History of Present Illness:    Alexandra Garrett is a 35 y.o. female with past medical history of anemia and family history of CAD who presents to the office today for follow-up.  She was last examined by Dr. Diona Garrett in 02/2020 is a new patient referral and reported intermittent episodes of chest pain over the past several months.  Reported some tightness in her chest with activity but could occur in the setting of emotional stress.  A coronary CT was recommended for further evaluation.  This showed a coronary calcium score of 0 with no evidence of CAD.    Past Medical History:  Diagnosis Date   Anemia    History of medication noncompliance    Seizures (HCC)    Since age 62 yrs    Past Surgical History:  Procedure Laterality Date   NO PAST SURGERIES      Current Medications: Outpatient Medications Prior to Visit  Medication Sig Dispense Refill   cetirizine (ZYRTEC) 10 MG tablet Take 1 tablet (10 mg total) by mouth daily. 30 tablet 11   Cholecalciferol (VITAMIN D3) 25 MCG (1000 UT) CAPS Take 1 capsule by mouth daily.     furosemide (LASIX) 20 MG tablet TAKE 1 TABLET(20 MG) BY MOUTH DAILY AS NEEDED FOR FLUID RETENTION 30 tablet 0   levETIRAcetam (KEPPRA) 750 MG tablet Take 1 tablet (750 mg total) by mouth 2 (two) times daily. 60 tablet 3   levonorgestrel (MIRENA) 20 MCG/24HR IUD 1 each by Intrauterine route once.     propranolol ER (INDERAL LA) 60 MG 24 hr capsule Take 1 capsule (60 mg total) by mouth at bedtime. 30 capsule 5   topiramate (TOPAMAX) 50 MG tablet Take 1 tablet (50 mg total) by mouth 2 (two) times daily. 60 tablet 5   No facility-administered medications prior to visit.     Allergies:   Patient has no known allergies.   Social History   Socioeconomic History    Marital status: Divorced    Spouse name: Not on file   Number of children: 2   Years of education: some college   Highest education level: Not on file  Occupational History   Not on file  Tobacco Use   Smoking status: Never   Smokeless tobacco: Never  Vaping Use   Vaping Use: Never used  Substance and Sexual Activity   Alcohol use: No    Alcohol/week: 0.0 standard drinks   Drug use: No   Sexual activity: Not Currently    Birth control/protection: I.U.D.  Other Topics Concern   Not on file  Social History Narrative   Right handed   Soda: 2-6 per day   Works at [employer].   Married April 2020,second marriage.Lives with husband and 2 kids.      Social Determinants of Health   Financial Resource Strain: Not on file  Food Insecurity: Not on file  Transportation Needs: Not on file  Physical Activity: Not on file  Stress: Not on file  Social Connections: Not on file     Family History:  The patient's ***family history includes Arthritis in her maternal grandmother and mother; Breast cancer in her mother; Cancer in her maternal grandfather and maternal grandmother; Depression in her mother; Diabetes in  her maternal grandmother; Heart disease in her father and paternal grandfather; Hypertension in her mother; Stroke in her paternal grandmother.   Review of Systems:    Please see the history of present illness.     All other systems reviewed and are otherwise negative except as noted above.   Physical Exam:    VS:  There were no vitals taken for this visit.   General: Well developed, well nourished,female appearing in no acute distress. Head: Normocephalic, atraumatic. Neck: No carotid bruits. JVD not elevated.  Lungs: Respirations regular and unlabored, without wheezes or rales.  Heart: ***Regular rate and rhythm. No S3 or S4.  No murmur, no rubs, or gallops appreciated. Abdomen: Appears non-distended. No obvious abdominal masses. Msk:  Strength and tone appear normal  for age. No obvious joint deformities or effusions. Extremities: No clubbing or cyanosis. No edema.  Distal pedal pulses are 2+ bilaterally. Neuro: Alert and oriented X 3. Moves all extremities spontaneously. No focal deficits noted. Psych:  Responds to questions appropriately with a normal affect. Skin: No rashes or lesions noted  Wt Readings from Last 3 Encounters:  09/18/20 264 lb 3.2 oz (119.8 kg)  08/29/20 258 lb 12.8 oz (117.4 kg)  07/18/20 263 lb (119.3 kg)        Studies/Labs Reviewed:   EKG:  EKG is*** ordered today.  The ekg ordered today demonstrates ***  Recent Labs: 06/14/2020: TSH 1.930 08/31/2020: ALT 19; B Natriuretic Peptide 42.0; BUN 17; Creatinine, Ser 0.96; Hemoglobin 13.6; Platelets 347; Potassium 3.7; Sodium 135   Lipid Panel    Component Value Date/Time   CHOL 184 08/29/2020 1534   TRIG 110 08/29/2020 1534   HDL 36 (L) 08/29/2020 1534   CHOLHDL 5.1 (H) 08/29/2020 1534   LDLCALC 126 (H) 08/29/2020 1534    Additional studies/ records that were reviewed today include:    Coronary CT: 03/2020 Coronary Arteries:  Normal coronary origin.  Right dominance.   RCA is a large dominant artery that gives rise to PDA and PLA. There is no plaque.   Left main is a large artery that gives rise to LAD and LCX arteries.   LAD is a large vessel that has no plaque. Small underfilled diagonals.   LCX is a non-dominant artery that gives rise to one large OM1 branch. There is no plaque. Small underfilled distal branches.   Other findings:   Normal pulmonary vein drainage into the left atrium.   Normal left atrial appendage without a thrombus.   Normal size of the pulmonary artery.   Please see radiology report for non cardiac findings.   IMPRESSION: 1. Coronary calcium score of 0.  Low risk study.   2. Normal coronary origin with right dominance.   3. No evidence of CAD.   4. CAD-RADS 0. No evidence of CAD (0%). Consider non-atherosclerotic causes of  chest pain.   Assessment:    No diagnosis found.   Plan:   In order of problems listed above:  ***    Shared Decision Making/Informed Consent:   {Are you ordering a CV Procedure (e.g. stress test, cath, DCCV, TEE, etc)?   Press F2        :[id number]}    Medication Adjustments/Labs and Tests Ordered: Current medicines are reviewed at length with the patient today.  Concerns regarding medicines are outlined above.  Medication changes, Labs and Tests ordered today are listed in the Patient Instructions below. There are no Patient Instructions on file for this visit.  Signed, Ellsworth Lennox, PA-C  10/09/2020 7:34 AM    [facility] [address] [place], [place] [address] Phone: [phone] Fax: [phone]

## 2020-10-25 ENCOUNTER — Other Ambulatory Visit: Payer: Self-pay | Admitting: Neurology

## 2020-10-31 ENCOUNTER — Ambulatory Visit (INDEPENDENT_AMBULATORY_CARE_PROVIDER_SITE_OTHER): Payer: Medicaid Other | Admitting: Nurse Practitioner

## 2020-11-07 ENCOUNTER — Telehealth: Payer: Self-pay | Admitting: Neurology

## 2020-11-07 NOTE — Telephone Encounter (Signed)
LVM for pt to call me back to schedule sleep study  

## 2020-12-11 NOTE — Progress Notes (Signed)
Cardiology Office Note    Date:  12/12/2020   ID:  Alexandra Garrett, DOB 21-Aug-1985, MRN [id number]  PCP:  Pcp, No  Cardiologist: Nona Dell, MD    Chief Complaint  Patient presents with   Follow-up    Dyspnea on Exertion; Chest Pain    History of Present Illness:    Alexandra Garrett is a 35 y.o. female with past medical history of chest pain (s/p Coronary CT in 03/2020 showing a Calcium Score of 0), anemia, headaches and seizure disorder who presents to the office today for follow-up.  She was examined by Dr. Diona Browner in 02/2020 as a new patient referral and reported intermittent episodes of chest tightness over the past few months which could occur with activity or stress. A Coronary CT was recommended for further evaluatin and showed a coronary calcium score of 0 with no evidence of CAD. The Radiology overread was also without acute abnormalities.  In talking with the patient and her husband today, she reports having worsening dyspnea on exertion over the past several months. Says that she gets short of breath with walking up the stairs to her apartment. She does report intermittent PND and is planning to undergo a sleep study in the coming months which is being arranged by Neurology.  Her husband does report she snores frequently. She denies any specific exertional chest pain but does report chest discomfort along her precordium which can radiate into her right arm and lasts for hours at a time and feels like an aching sensation. She was previously having issues with lower extremity edema when standing on her feet while working at [employer] but reports this has improved since switching jobs. She has a prescription for Lasix 20 mg as needed but has not utilized this routinely.   Past Medical History:  Diagnosis Date   Anemia    History of medication noncompliance    Seizures (HCC)    Since age 70 yrs    Past Surgical History:  Procedure Laterality Date   NO PAST SURGERIES       Current Medications: Outpatient Medications Prior to Visit  Medication Sig Dispense Refill   cetirizine (ZYRTEC) 10 MG tablet Take 1 tablet (10 mg total) by mouth daily. 30 tablet 11   Cholecalciferol (VITAMIN D3) 25 MCG (1000 UT) CAPS Take 1 capsule by mouth daily.     furosemide (LASIX) 20 MG tablet TAKE 1 TABLET(20 MG) BY MOUTH DAILY AS NEEDED FOR FLUID RETENTION 30 tablet 0   levETIRAcetam (KEPPRA) 750 MG tablet TAKE 1 TABLET(750 MG) BY MOUTH TWICE DAILY 60 tablet 3   levonorgestrel (MIRENA) 20 MCG/24HR IUD 1 each by Intrauterine route once.     propranolol ER (INDERAL LA) 60 MG 24 hr capsule Take 1 capsule (60 mg total) by mouth at bedtime. 30 capsule 5   topiramate (TOPAMAX) 50 MG tablet Take 1 tablet (50 mg total) by mouth 2 (two) times daily. 60 tablet 5   No facility-administered medications prior to visit.     Allergies:   Patient has no known allergies.   Social History   Socioeconomic History   Marital status: Divorced    Spouse name: Not on file   Number of children: 2   Years of education: some college   Highest education level: Not on file  Occupational History   Not on file  Tobacco Use   Smoking status: Never   Smokeless tobacco: Never  Vaping Use   Vaping Use: Never used  Substance and Sexual Activity   Alcohol use: No    Alcohol/week: 0.0 standard drinks   Drug use: No   Sexual activity: Not Currently    Birth control/protection: I.U.D.  Other Topics Concern   Not on file  Social History Narrative   Right handed   Soda: 2-6 per day   Works at [employer].   Married April 2020,second marriage.Lives with husband and 2 kids.      Social Determinants of Health   Financial Resource Strain: Not on file  Food Insecurity: Not on file  Transportation Needs: Not on file  Physical Activity: Not on file  Stress: Not on file  Social Connections: Not on file     Family History:  The patient's family history includes Arthritis in her maternal  grandmother and mother; Breast cancer in her mother; Cancer in her maternal grandfather and maternal grandmother; Depression in her mother; Diabetes in her maternal grandmother; Heart disease in her father and paternal grandfather; Hypertension in her mother; Stroke in her paternal grandmother.   Review of Systems:    Please see the history of present illness.     All other systems reviewed and are otherwise negative except as noted above.   Physical Exam:    VS:  BP 110/60   Pulse 62   Ht 5\' 2"  (1.575 m)   Wt 251 lb (113.9 kg)   SpO2 99%   BMI 45.91 kg/m    General: Well developed, well nourished,female appearing in no acute distress. Head: Normocephalic, atraumatic. Neck: No carotid bruits. JVD not elevated.  Lungs: Respirations regular and unlabored, without wheezes or rales.  Heart: Regular rhythm, bradycardiac rate. No S3 or S4.  No murmur, no rubs, or gallops appreciated. Abdomen: Appears non-distended. No obvious abdominal masses. Msk:  Strength and tone appear normal for age. No obvious joint deformities or effusions. Extremities: No clubbing or cyanosis. No pitting edema.  Distal pedal pulses are 2+ bilaterally. Neuro: Alert and oriented X 3. Moves all extremities spontaneously. No focal deficits noted. Psych:  Responds to questions appropriately with a normal affect. Skin: No rashes or lesions noted  Wt Readings from Last 3 Encounters:  12/12/20 251 lb (113.9 kg)  09/18/20 264 lb 3.2 oz (119.8 kg)  08/29/20 258 lb 12.8 oz (117.4 kg)     Studies/Labs Reviewed:   EKG:  EKG is not ordered today. EKG from 08/31/2020 is reviewed and shows sinus bradycardia, HR 51 with no acute ST changes.   Recent Labs: 06/14/2020: TSH 1.930 08/31/2020: ALT 19; B Natriuretic Peptide 42.0; BUN 17; Creatinine, Ser 0.96; Hemoglobin 13.6; Platelets 347; Potassium 3.7; Sodium 135   Lipid Panel    Component Value Date/Time   CHOL 184 08/29/2020 1534   TRIG 110 08/29/2020 1534   HDL 36 (L)  08/29/2020 1534   CHOLHDL 5.1 (H) 08/29/2020 1534   LDLCALC 126 (H) 08/29/2020 1534    Additional studies/ records that were reviewed today include:   Coronary CT: 03/20/2020 Aorta:  Normal size.  No calcifications.  No dissection.   Aortic Valve:  Trileaflet.  No calcifications.   Coronary Arteries:  Normal coronary origin.  Right dominance.   RCA is a large dominant artery that gives rise to PDA and PLA. There is no plaque.   Left main is a large artery that gives rise to LAD and LCX arteries.   LAD is a large vessel that has no plaque. Small underfilled diagonals.   LCX is a non-dominant artery that gives rise  to one large OM1 branch. There is no plaque. Small underfilled distal branches.   Other findings:   Normal pulmonary vein drainage into the left atrium.   Normal left atrial appendage without a thrombus.   Normal size of the pulmonary artery.   Please see radiology report for non cardiac findings.   IMPRESSION: 1. Coronary calcium score of 0.  Low risk study.   2. Normal coronary origin with right dominance.   3. No evidence of CAD.   4. CAD-RADS 0. No evidence of CAD (0%). Consider non-atherosclerotic causes of chest pain.     Assessment:    1. Chest pain, unspecified type   2. Dyspnea on exertion   3. Bradycardia   4. Bilateral lower extremity edema   5. Sleep-disordered breathing   6. Anemia, unspecified type      Plan:   In order of problems listed above:  1. Chest Pain/Dyspnea on Exertion/Bradycardia - Prior Coronary CT in 03/2020 showed a calcium score of 0. Her episodes of chest pain overall seem atypical for a cardiac etiology as it is a discomfort that radiates down her right arm and can last for hours at a time and is sometimes worse with coughing which sounds more pleuritic in etiology. - Her dyspnea worsened around the timeframe Propanolol ER was started by Neurology for headaches and I am concerned this is causing chronotropic  incompetence as her heart rate has been in the 50's to 60's. I will send a note to Neurology today to see if this can be held to see if symptoms improve.   Addendum: I heard back from Neurology and they are fine with her holding Propranolol to see if symptoms improve. Called the patient and spoke to her husband who will communicate this to her.   2. Lower Extremity Edema - She was previously experiencing intermittent lower extremity edema which occurred while standing for 8+ hours at her job but has since switched jobs and has experienced improvement in her symptoms. I recommended that she elevate her lower extremities when able and consider the use of compression stockings. She does have a prescription for Lasix 20mg  PRN but does not have to use this frequently.  - I encouraged her to make aware if she has recurrent symptoms as an echocardiogram can be ordered.  3. Sleep-Disordered Breathing - Her spouse does report she snores frequently and she also experiences daytime somnolence. She is being followed by Dr. [place] with Neurology with plans for an upcoming sleep study.  4. Anemia - Hgb was stable at 13.6 by labs in 08/2020.   Medication Adjustments/Labs and Tests Ordered: Current medicines are reviewed at length with the patient today.  Concerns regarding medicines are outlined above.  Medication changes, Labs and Tests ordered today are listed in the Patient Instructions below. Patient Instructions  Medication Instructions:  Your physician recommends that you continue on your current medications as directed. Please refer to the Current Medication list given to you today.  We will reach out once we talk with Neurology   *If you need a refill on your cardiac medications before your next appointment, please call your pharmacy*   Lab Work: NONE   If you have labs (blood work) drawn today and your tests are completely normal, you will receive your results only by: MyChart Message (if  you have MyChart) OR A paper copy in the mail If you have any lab test that is abnormal or we need to change your treatment, we  will call you to review the results.   Testing/Procedures: NONE    Follow-Up: At [facility], you and your health needs are our priority.  As part of our continuing mission to provide you with exceptional heart care, we have created designated Provider Care Teams.  These Care Teams include your primary Cardiologist (physician) and Advanced Practice Providers (APPs -  Physician Assistants and Nurse Practitioners) who all work together to provide you with the care you need, when you need it.  We recommend signing up for the patient portal called "MyChart".  Sign up information is provided on this After Visit Summary.  MyChart is used to connect with patients for Virtual Visits (Telemedicine).  Patients are able to view lab/test results, encounter notes, upcoming appointments, etc.  Non-urgent messages can be sent to your provider as well.   To learn more about what you can do with MyChart, go to [email or web address].    Your next appointment:    As Needed   The format for your next appointment:   In Person  Provider:   Nona Dell, MD or Randall An, PA-C   Other Instructions Thank you for choosing [facility]!     Signed, Ellsworth Lennox, PA-C  12/12/2020 5:02 PM    [facility] [address] [place], [place] [address] Phone: [phone] Fax: [phone]

## 2020-12-12 ENCOUNTER — Ambulatory Visit: Payer: Medicaid Other | Admitting: Student

## 2020-12-12 ENCOUNTER — Other Ambulatory Visit: Payer: Self-pay

## 2020-12-12 ENCOUNTER — Encounter: Payer: Self-pay | Admitting: Student

## 2020-12-12 ENCOUNTER — Encounter (INDEPENDENT_AMBULATORY_CARE_PROVIDER_SITE_OTHER): Payer: Self-pay

## 2020-12-12 VITALS — BP 110/60 | HR 62 | Ht 62.0 in | Wt 251.0 lb

## 2020-12-12 DIAGNOSIS — R0609 Other forms of dyspnea: Secondary | ICD-10-CM

## 2020-12-12 DIAGNOSIS — D649 Anemia, unspecified: Secondary | ICD-10-CM | POA: Diagnosis not present

## 2020-12-12 DIAGNOSIS — R001 Bradycardia, unspecified: Secondary | ICD-10-CM | POA: Diagnosis not present

## 2020-12-12 DIAGNOSIS — R079 Chest pain, unspecified: Secondary | ICD-10-CM

## 2020-12-12 DIAGNOSIS — R600 Localized edema: Secondary | ICD-10-CM | POA: Diagnosis not present

## 2020-12-12 DIAGNOSIS — G4730 Sleep apnea, unspecified: Secondary | ICD-10-CM | POA: Diagnosis not present

## 2020-12-12 NOTE — Patient Instructions (Signed)
Medication Instructions:  Your physician recommends that you continue on your current medications as directed. Please refer to the Current Medication list given to you today.  We will reach out once we talk with Neurology   *If you need a refill on your cardiac medications before your next appointment, please call your pharmacy*   Lab Work: NONE   If you have labs (blood work) drawn today and your tests are completely normal, you will receive your results only by: MyChart Message (if you have MyChart) OR A paper copy in the mail If you have any lab test that is abnormal or we need to change your treatment, we will call you to review the results.   Testing/Procedures: NONE    Follow-Up: At [facility], you and your health needs are our priority.  As part of our continuing mission to provide you with exceptional heart care, we have created designated Provider Care Teams.  These Care Teams include your primary Cardiologist (physician) and Advanced Practice Providers (APPs -  Physician Assistants and Nurse Practitioners) who all work together to provide you with the care you need, when you need it.  We recommend signing up for the patient portal called "MyChart".  Sign up information is provided on this After Visit Summary.  MyChart is used to connect with patients for Virtual Visits (Telemedicine).  Patients are able to view lab/test results, encounter notes, upcoming appointments, etc.  Non-urgent messages can be sent to your provider as well.   To learn more about what you can do with MyChart, go to [email or web address].    Your next appointment:    As Needed   The format for your next appointment:   In Person  Provider:   Nona Dell, MD or Randall An, PA-C   Other Instructions Thank you for choosing [facility]!

## 2020-12-18 ENCOUNTER — Other Ambulatory Visit: Payer: Self-pay

## 2020-12-18 ENCOUNTER — Encounter: Payer: Self-pay | Admitting: Emergency Medicine

## 2020-12-18 ENCOUNTER — Ambulatory Visit
Admission: EM | Admit: 2020-12-18 | Discharge: 2020-12-18 | Disposition: A | Discharge status: Home / Self Care | Payer: Medicaid Other | Attending: Internal Medicine | Admitting: Internal Medicine

## 2020-12-18 DIAGNOSIS — R1011 Right upper quadrant pain: Secondary | ICD-10-CM

## 2020-12-18 MED ORDER — IBUPROFEN 600 MG PO TABS: 600.0000 mg | ORAL_TABLET | Freq: Four times a day (QID) | ORAL | 0 refills | Status: DC | PRN

## 2020-12-18 NOTE — Discharge Instructions (Signed)
Please take medications as prescribed Gentle stretches recommended Heating pad use with the 20 minutes on-20 minutes off cycle. If symptoms persist, go to the emergency department for imaging studies.

## 2020-12-18 NOTE — ED Triage Notes (Signed)
Upper ABD pain and can feel a knot in upper ABD area since last week.

## 2020-12-20 ENCOUNTER — Ambulatory Visit (INDEPENDENT_AMBULATORY_CARE_PROVIDER_SITE_OTHER): Payer: Medicaid Other | Admitting: Neurology

## 2020-12-20 ENCOUNTER — Other Ambulatory Visit: Payer: Self-pay

## 2020-12-20 DIAGNOSIS — G4733 Obstructive sleep apnea (adult) (pediatric): Secondary | ICD-10-CM | POA: Diagnosis not present

## 2020-12-20 DIAGNOSIS — R569 Unspecified convulsions: Secondary | ICD-10-CM

## 2020-12-20 DIAGNOSIS — Z820 Family history of epilepsy and other diseases of the nervous system: Secondary | ICD-10-CM

## 2020-12-20 DIAGNOSIS — G4719 Other hypersomnia: Secondary | ICD-10-CM

## 2020-12-20 DIAGNOSIS — G4720 Circadian rhythm sleep disorder, unspecified type: Secondary | ICD-10-CM

## 2020-12-20 DIAGNOSIS — R0683 Snoring: Secondary | ICD-10-CM

## 2020-12-20 DIAGNOSIS — R351 Nocturia: Secondary | ICD-10-CM

## 2020-12-20 DIAGNOSIS — R519 Headache, unspecified: Secondary | ICD-10-CM

## 2020-12-20 DIAGNOSIS — R0681 Apnea, not elsewhere classified: Secondary | ICD-10-CM

## 2020-12-20 NOTE — ED Provider Notes (Signed)
[facility]    CSN: [id number] Arrival date & time: 12/18/20  1205      History   Chief Complaint No chief complaint on file.   HPI Alexandra Garrett is a 35 y.o. female comes to the urgent care with epigastric abdominal pain of 1 week duration.  Patient says pain is of moderate severity.  Pain is sharp, aggravated by palpation.  No known relieving factors.  She has not tried any over-the-counter medications.  Pain is not affected by eating or hunger.  No weight loss.  No vomiting or diarrhea.  No trauma to the abdomen.   HPI  Past Medical History:  Diagnosis Date   Anemia    History of medication noncompliance    Seizures (HCC)    Since age 9 yrs    Patient Active Problem List   Diagnosis Date Noted   Anxiety 04/21/2017   Low vitamin B12 level 04/21/2017   Class 3 severe obesity due to excess calories with serious comorbidity and body mass index (BMI) of 45.0 to 49.9 in adult [facility]) 04/21/2017   Vitamin D deficiency 04/21/2017   Mixed hyperlipidemia 04/21/2017   Encounter for IUD insertion 12/19/2015   Seizures (HCC)     Past Surgical History:  Procedure Laterality Date   NO PAST SURGERIES      OB History     Gravida      Para      Term      Preterm      AB      Living  2      SAB      IAB      Ectopic      Multiple      Live Births               Home Medications    Prior to Admission medications   Medication Sig Start Date End Date Taking? Authorizing Provider  ibuprofen (ADVIL) 600 MG tablet Take 1 tablet (600 mg total) by mouth every 6 (six) hours as needed. 12/18/20  Yes Elley Harp, Britta Mccreedy, MD  cetirizine (ZYRTEC) 10 MG tablet Take 1 tablet (10 mg total) by mouth daily. 06/20/19   Bing Neighbors, FNP  Cholecalciferol (VITAMIN D3) 25 MCG (1000 UT) CAPS Take 1 capsule by mouth daily.    [provider]  furosemide (LASIX) 20 MG tablet TAKE 1 TABLET(20 MG) BY MOUTH DAILY AS NEEDED FOR FLUID RETENTION 10/03/20    Lilly Cove C, MD  levETIRAcetam (KEPPRA) 750 MG tablet TAKE 1 TABLET(750 MG) BY MOUTH TWICE DAILY 10/25/20   Micki Riley, MD  levonorgestrel (MIRENA) 20 MCG/24HR IUD 1 each by Intrauterine route once.    [provider]  propranolol ER (INDERAL LA) 60 MG 24 hr capsule Take 1 capsule (60 mg total) by mouth at bedtime. 08/02/20   Ihor Austin, NP  topiramate (TOPAMAX) 50 MG tablet Take 1 tablet (50 mg total) by mouth 2 (two) times daily. 08/02/20   Ihor Austin, NP    Family History Family History  Problem Relation Age of Onset   Arthritis Mother    Depression Mother    Hypertension Mother    Breast cancer Mother        lymph nodes   Heart disease Father    Arthritis Maternal Grandmother    Cancer Maternal Grandmother        breast   Diabetes Maternal Grandmother    Cancer Maternal Grandfather  lung   Stroke Paternal Grandmother    Heart disease Paternal Grandfather     Social History Social History   Tobacco Use   Smoking status: Never   Smokeless tobacco: Never  Vaping Use   Vaping Use: Never used  Substance Use Topics   Alcohol use: No    Alcohol/week: 0.0 standard drinks   Drug use: No     Allergies   Patient has no known allergies.   Review of Systems Review of Systems  Gastrointestinal:  Positive for abdominal pain. Negative for diarrhea, nausea and vomiting.    Physical Exam Triage Vital Signs ED Triage Vitals [12/18/20 1247]  Enc Vitals Group     BP 122/77     Pulse Rate 64     Resp 18     Temp 98 F (36.7 C)     Temp Source Oral     SpO2 99 %     Weight      Height      Head Circumference      Peak Flow      Pain Score 8     Pain Loc      Pain Edu?      Excl. in GC?    No data found.  Updated Vital Signs BP 122/77 (BP Location: Right Arm)   Pulse 64   Temp 98 F (36.7 C) (Oral)   Resp 18   SpO2 99%   Visual Acuity Right Eye Distance:   Left Eye Distance:   Bilateral Distance:    Right Eye Near:    Left Eye Near:    Bilateral Near:     Physical Exam Vitals and nursing note reviewed.  Constitutional:      General: She is not in acute distress.    Appearance: She is not ill-appearing.  Cardiovascular:     Rate and Rhythm: Normal rate and regular rhythm.  Abdominal:     Comments: Point tenderness over the superior aspect of the right rectus abdominis muscle.  No bruising noted.  No guarding or rebound tenderness.  Neurological:     Mental Status: She is alert.     UC Treatments / Results  Labs (all labs ordered are listed, but only abnormal results are displayed) Labs Reviewed - No data to display  EKG   Radiology No results found.  Procedures Procedures (including critical care time)  Medications Ordered in UC Medications - No data to display  Initial Impression / Assessment and Plan / UC Course  I have reviewed the triage vital signs and the nursing notes.  Pertinent labs & imaging results that were available during my care of the patient were reviewed by me and considered in my medical decision making (see chart for details).     1.  Anterior abdominal wall pain: Warm compress Gentle stretches Tylenol/Motrin as needed for pain If symptoms worsen please return to urgent care to be reevaluated. Final Clinical Impressions(s) / UC Diagnoses   Final diagnoses:  Abdominal wall pain in right upper quadrant     Discharge Instructions      Please take medications as prescribed Gentle stretches recommended Heating pad use with the 20 minutes on-20 minutes off cycle. If symptoms persist, go to the emergency department for imaging studies.   ED Prescriptions     Medication Sig Dispense Auth. Provider   ibuprofen (ADVIL) 600 MG tablet Take 1 tablet (600 mg total) by mouth every 6 (six) hours as needed. 30 tablet Demaris Callander  O, MD      PDMP not reviewed this encounter.   Merrilee Jansky, MD 12/20/20 [phone]

## 2021-01-02 NOTE — Procedures (Signed)
nPATIENT'S NAME:  Alexandra Garrett, Alexandra Garrett DOB:      Jul 18, 1985      MR#:    [id number]     DATE OF RECORDING: 12/20/2020 REFERRING M.D.:  Mercy Moore, MD, Ihor Austin, NP Study Performed:   Baseline Polysomnogram HISTORY: 35 year old woman with a history of anemia, seizure disorder, memory loss, and severe obesity with a BMI of over 45, who reports snoring and excessive daytime somnolence. The patient endorsed the Epworth Sleepiness Scale at 18 points. The patient's weight 264 pounds with a height of 62 (inches), resulting in a BMI of 48.7 kg/m2. The patient's neck circumference measured 17.5 inches.  CURRENT MEDICATIONS: Zyrtec, Vitamin D3, Lasix, Keppra,  Mirena, Inderal LA, Topamax   PROCEDURE:  This is a multichannel digital polysomnogram utilizing the Somnostar 11.2 system.  Electrodes and sensors were applied and monitored per AASM Specifications.   EEG, EOG, Chin and Limb EMG, were sampled at 200 Hz.  ECG, Snore and Nasal Pressure, Thermal Airflow, Respiratory Effort, CPAP Flow and Pressure, Oximetry was sampled at 50 Hz. Digital video and audio were recorded.      BASELINE STUDY  Lights Out was at 22:04 and Lights On at 05:15.  Total recording time (TRT) was 431.5 minutes, with a total sleep time (TST) of 304 minutes.   The patient's sleep latency was 115 minutes, which is delayed. REM latency was 31.5 minutes, which is mildly reduced. The sleep efficiency was 70.5 %.     SLEEP ARCHITECTURE: WASO (Wake after sleep onset) was 12.5 minutes.  There were 9 minutes in Stage N1, 210.5 minutes Stage N2, 10.5 minutes Stage N3 and 74 minutes in Stage REM.  The percentage of Stage N1 was 3.%, Stage N2 was 69.2%, which is increased, Stage N3 was 3.5%, which is reduced and Stage R (REM sleep) was 24.3%, which is normal. The arousals were noted as: 7 were spontaneous, 0 were associated with PLMs, 1 were associated with respiratory events.  RESPIRATORY ANALYSIS:  There were a total of 24 respiratory events:  0  obstructive apneas, 0 central apneas and 0 mixed apneas with a total of 0 apneas and an apnea index (AI) of 0 /hour. There were 24 hypopneas with a hypopnea index of 4.7 /hour. The patient also had 0 respiratory event related arousals (RERAs).      The total APNEA/HYPOPNEA INDEX (AHI) was 4.7/hour and the total RESPIRATORY DISTURBANCE INDEX was  4.7 /hour.  21 events occurred in REM sleep and 6 events in NREM. The REM AHI was  17. /hour, versus a non-REM AHI of .8. The patient spent 0 minutes of total sleep time in the supine position and 304 minutes in non-supine.. The supine AHI was 0.0 versus a non-supine AHI of 4.7.  OXYGEN SATURATION & C02:  The Wake baseline 02 saturation was 98%, with the lowest being 89%. Time spent below 89% saturation equaled 0 minutes.  PERIODIC LIMB MOVEMENTS: The patient had a total of 6 Periodic Limb Movements.  The Periodic Limb Movement (PLM) index was 1.2 and the PLM Arousal index was 0/hour.  Audio and video analysis did not show any abnormal or unusual movements, behaviors, phonations or vocalizations. The patient took no bathroom breaks. Very mild intermittent snoring was noted. The EKG was in keeping with normal sinus rhythm (NSR).  Post-study, the patient indicated that sleep was worse than usual.   IMPRESSION:  Primary Snoring Dysfunctions associated with sleep stages or arousal from sleep  RECOMMENDATIONS:  This study does not demonstrate any  significant obstructive or central sleep disordered breathing with the exception of mild REM sleep related sleep apnea. The absence of supine sleep during this study may underestimate her sleep disordered breathing. Snoring was rather mild and intermittent. Treatment with positive airway pressure, such as a CPAP or autoPAP machine is not necessary; overall AHI was less than 5/hour. Weight loss and avoidance of the supine sleep position may aid in reducing her mild sleep-stage related sleep apnea. This study does not  support an intrinsic sleep disorder as a cause of the patient's symptoms. Other causes, including circadian rhythm disturbances, an underlying mood disorder, medication effect and/or an underlying medical problem cannot be ruled out. This study shows mildly abnormal sleep stage percentages; these are nonspecific findings and per se do not signify an intrinsic sleep disorder or a cause for the patient's sleep-related symptoms. Causes include (but are not limited to) the first night effect of the sleep study, circadian rhythm disturbances, medication effect or an underlying mood disorder or medical problem.  The patient should be cautioned not to drive, work at heights, or operate dangerous or heavy equipment when tired or sleepy. Review and reiteration of good sleep hygiene measures should be pursued with any patient. The patient will be advised to follow up with the referring provider, who will be notified of the test results.  I certify that I have reviewed the entire raw data recording prior to the issuance of this report in accordance with the Standards of Accreditation of the American Academy of Sleep Medicine (AASM)  Huston Foley, MD, PhD Diplomat, American Board of Neurology and Sleep Medicine (Neurology and Sleep Medicine)

## 2021-01-03 ENCOUNTER — Telehealth: Payer: Self-pay | Admitting: *Deleted

## 2021-01-03 NOTE — Telephone Encounter (Signed)
Patient returned my call.  We discussed her sleep study results.  Patient understands that her sleep study did not show any significant sleep apnea from the exception of mildly REM sleep related sleep apnea.  Patient stated that she has actually lost some weight recently and also states that she does not sleep on her back.  Patient stated she is still dealing with headaches and would like to know what to do.  We discussed that she has a follow-up in 2 weeks with Dr. Pearlean Brownie and this can be discussed in more detail at that appt.  She did state that she was taken off propranolol by cardiology because she was having chest pain, bradycardia, and other side effects.  Recently her headaches had worsened so she restarted the propranolol on her own.  I advised she really should talk to the cardiologist about this first and that we could potentially increase her Topamax dose.  She is currently taking 50 mg twice daily.  She stated she would go ahead and stop the propranolol and would like to increase Topamax if okay per Shanda Bumps NP.  I let her know a message will be sent and we will call her back. She verbalized appreciation.

## 2021-01-03 NOTE — Telephone Encounter (Signed)
Called pt and LVM (ok per DPR) asking for call back to discuss sleep study results. 

## 2021-01-03 NOTE — Telephone Encounter (Signed)
-----   Message from Huston Foley, MD sent at 01/02/2021  6:06 PM EDT ----- Patient referred by Shanda Bumps, seen by me on 09/18/20, diagnostic PSG on 12/20/20.   Please call and notify the patient that the recent sleep study did not show any significant obstructive sleep apnea with the exception of mild REM sleep related sleep apnea. The absence of supine sleep during this study may underestimate her sleep disordered breathing. Snoring was rather mild and intermittent. Treatment with positive airway pressure, such as a CPAP or autoPAP machine is not necessary; overall AHI was less than 5/hour. Weight loss and avoidance of the supine (back) sleep position may aid in reducing her mild sleep-stage related sleep apnea. She can FU with Shanda Bumps and Dr. Pearlean Brownie as planned.   Thanks,  Huston Foley, MD, PhD [facility])

## 2021-01-03 NOTE — Telephone Encounter (Signed)
Can increase topamax: continue AM dose 50mg  and increase nightly dose to 100mg . Continue that regimen until she has follow-up with Dr. . Agree with not restarting the propranolol per cardiology recommendations.  Thank you.

## 2021-01-03 NOTE — Telephone Encounter (Signed)
Called pt and LVM asking for call back. Left office number in message. When the pt calls back, it is ok to tell her that Shanda Bumps NP has advised that she not restart the propranolol as recommended by cardiology. She also can increase her Topamax dose so she will now take 50 mg in the morning and 100 mg (2 tablets) at bedtime. Will see how she is doing when she sees Dr Pearlean Brownie on 11/14. If she has any questions please forward to [place] to address as soon as able.

## 2021-01-07 NOTE — Telephone Encounter (Addendum)
Patient returned my call & we discussed the message from [facility] NP as follows:  Can increase topamax: continue AM dose 50mg  and increase nightly dose to 100mg . Continue that regimen until she has follow-up with Dr. . Agree with not restarting the propranolol per cardiology recommendations.  Thank you.     Patient verbalized appreciation and understanding. She will call back when refill needed so we can send in new Rx to accommodate for change in quantity and instructions.

## 2021-01-07 NOTE — Telephone Encounter (Signed)
Called pt on her mobile # and LVM asking for call back.

## 2021-01-21 ENCOUNTER — Telehealth: Payer: Self-pay | Admitting: Neurology

## 2021-01-21 ENCOUNTER — Ambulatory Visit: Payer: Medicaid Other | Admitting: Neurology

## 2021-01-21 NOTE — Telephone Encounter (Signed)
FYI- Pt rescheduled her appt for today due to having the FLU.

## 2021-01-30 ENCOUNTER — Other Ambulatory Visit: Payer: Self-pay | Admitting: Neurology

## 2021-01-30 ENCOUNTER — Other Ambulatory Visit: Payer: Self-pay

## 2021-01-30 MED ORDER — TOPIRAMATE 50 MG PO TABS: 50.0000 mg | ORAL_TABLET | Freq: Two times a day (BID) | ORAL | 5 refills | Status: DC

## 2021-03-26 ENCOUNTER — Ambulatory Visit: Payer: Medicaid Other | Admitting: Adult Health

## 2021-03-26 ENCOUNTER — Encounter: Payer: Self-pay | Admitting: Adult Health

## 2021-03-26 VITALS — BP 113/71 | HR 54 | Ht 62.0 in | Wt 254.0 lb

## 2021-03-26 DIAGNOSIS — F32A Depression, unspecified: Secondary | ICD-10-CM

## 2021-03-26 DIAGNOSIS — G43709 Chronic migraine without aura, not intractable, without status migrainosus: Secondary | ICD-10-CM | POA: Diagnosis not present

## 2021-03-26 DIAGNOSIS — R569 Unspecified convulsions: Secondary | ICD-10-CM | POA: Diagnosis not present

## 2021-03-26 DIAGNOSIS — G44209 Tension-type headache, unspecified, not intractable: Secondary | ICD-10-CM | POA: Diagnosis not present

## 2021-03-26 DIAGNOSIS — F419 Anxiety disorder, unspecified: Secondary | ICD-10-CM | POA: Diagnosis not present

## 2021-03-26 MED ORDER — LEVETIRACETAM 750 MG PO TABS: 750.0000 mg | ORAL_TABLET | Freq: Two times a day (BID) | ORAL | 3 refills | Status: DC

## 2021-03-26 MED ORDER — EMGALITY 120 MG/ML ~~LOC~~ SOAJ: 120.0000 mg | SUBCUTANEOUS | 11 refills | Status: DC

## 2021-03-26 MED ORDER — TOPIRAMATE 100 MG PO TABS: 100.0000 mg | ORAL_TABLET | Freq: Two times a day (BID) | ORAL | 11 refills | Status: DC

## 2021-03-26 MED ORDER — TIZANIDINE HCL 4 MG PO TABS: 4.0000 mg | ORAL_TABLET | Freq: Every day | ORAL | 11 refills | Status: DC

## 2021-03-26 MED ORDER — GALCANEZUMAB-GNLM 120 MG/ML ~~LOC~~ SOAJ: 240.0000 mg | Freq: Once | SUBCUTANEOUS | Status: DC

## 2021-03-26 NOTE — Patient Instructions (Addendum)
Your Plan:  Increase topamax to 100mg  twice daily  Start tizanidine 4mg  nightly - please let me know after 1 week if no benefit or sooner if difficulty tolerating   Start Emgality injection every 30 days - you received your first dose today and then your second dose will be on 2/18.  Referral placed again to behavioral health - if you do not hear from them by end of next week, please let know  Continue Keppra 750 mg twice daily for seizure prevention  Please sign up for MyChart which makes communication much easier if medications need to be adjusted    Follow-up in 6 months or call earlier if needed      Thank you for coming to see 3/18 at [facility]. I hope we have been able to provide you high quality care today.  You may receive a patient satisfaction survey over the next few weeks. We would appreciate your feedback and comments so that we may continue to improve ourselves and the health of our patients.

## 2021-03-26 NOTE — Progress Notes (Signed)
Per Gae Gallop, NP, identified patient, explained proper Emgality storage and administration, answered her questions to her stated satisfaction. Using aspetic technique, gave patient Emgality loading dose of 240 mg, two pens of 120 mg each. One injection in subqu upper left arm, one injection in subqu upper right arm. She tolerated injections well. Patient verbalized understanding, appreciation.

## 2021-03-26 NOTE — Progress Notes (Signed)
[facility] [address] [place]. [place] [address] [phone]       OFFICE FOLLOW UP NOTE  [name] Date of Birth:  June 22, 1985 Medical Record Number:  [id number]   Referring MD: Alexandra [facility] Reason for Referral: Seizure   Chief Complaint  Patient presents with   Follow-up    RM 2 alone Pt is well, no sz since last visit. Still having daily headaches, needs medication adjustment. Memory is stable,  getting a lot better.       HPI:   Update 03/26/2021 JM: Returns for routine follow-up after prior visit 8 months ago for headaches, seizures and memory loss concerns.  Pt eval by Dr. Frances [facility] for suspected sleep apnea in 09/2020 with completion of sleep study 12/2020 which did not show evidence of sleep apnea. Upon call for these results, she disucssed with RN headaches worsening and restarted propranolol on her own despite this previously being discontinued by cardiology due to bradycardia - recommended increasing topiramate dosage from 50mg  BID to 50mg  AM and 100mg  PM and stop propranolol per cardiology recommendations.  Tolerating current dose of topiramate with some benefit after increasing but still c/o daily tension type headaches still bitemporal and occasionally occipital. She will also have approx 15 worsening headaches per month which seem to be more migrainous type.   Has remained on keppra 750 mg twice daily without side effects and no seizure activity  Short-term memory loss improved since prior visit - has been doing compensation strategies to further help Referred to [facility] but per patient, never called - per referral review, it was denied as [facility] not accepting new referrals. Still having issues with depression/anxiety which can trigger worsening headaches/migraines   No further concerns at this time    History provided for reference purposes only Update 07/18/2020 JM: Alexandra Garrett returns for sooner visit to review recent test results  and continued memory concerns and headaches.  She is accompanied by her husband.  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  She is also been experiencing frequent headaches located bitemporally with constant pressure which can fluctuate throughout the day at times associated with photophobia and phonophobia.  Denies any visual changes.  At times memory can be worse with worsening headache.  She does admit to greatly increased stress levels as well as underlying untreated anxiety.  She will take Aleve frequently throughout the day without much benefit.  Reports sleeping well at night but does have daytime fatigue and husband reports snoring and witnessed apnea.  Thankfully, she has not had any additional seizures that she is aware of and has been tolerating Keppra 750 mg twice daily without side effects.  No further concerns at this time  Consult visit 06/14/2020 Dr. Carolyne [facility]: Alexandra Garrett is a 36 year old Caucasian lady seen today for initial office consultation visit for seizures.  History is obtained from the patient, review of electronic medical records and I personally reviewed available imaging films in PACS.  Patient has past medical history of anemia, seizures.  Her seizures began at approximately age 62 and continued for  around 10 years and stopped at age 70.  She was seen by Dr. Sharene Garrett in was on Tegretol which worked quite well.  Seizures were described as being generalized tonic-clonic with tongue bite and injury.  Tegretol was tapered off and she did  well and remained seizure-free for for almost 10 years and started having nocturnal seizures from age 2.  The patient states she knows she is at a seizure because she wakes up with a bad headache and occasionally has tongue bites as well as feels muscles are sore and she is tired for the next day.  She was seen in the emergency room at [place] [facility] where she lived for several times for the seizures.  She has moved to [place] 7 years ago and has seizures around 102/year.  She is has been seen Dr. Gerilyn [facility].  He did a polysomnogram which was apparently unremarkable.  He started her on Keppra 500 mg twice daily which she is tolerating well without any side effects.  Her last seizure was 2 3 weeks ago and the EMS were called but by the time they arrived patient was recovering back to baseline and refused to go to the ER.  She states she is been compliant with her Keppra and has not missed a single dose.  She states she has been starting having headaches after seizures and at times even memory loss.  Seizures often triggered by stress and lack of sleep.  She had a Mini-Mental status exam done today and scored   26 out of 30 with deficits in orientation and attention calculation and recall.  She is worried that her memory loss after seizure is a new finding.  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  I was unable to get records from Dr. Gerilyn Garrett, Dr. Sharene [facility] or any prior EEG studies for my review today  ROS:   14 system review of systems is positive for those listed in HPI and all other systems negative  PMH:  Past Medical History:  Diagnosis Date   Anemia    History of medication noncompliance    Seizures (HCC)    Since age 50 yrs    Social History:  Social History   Socioeconomic History   Marital status: Divorced    Spouse name: Not on file   Number of children: 2   Years of  education: some college   Highest education level: Not on file  Occupational History   Not on file  Tobacco Use   Smoking status: Never   Smokeless tobacco: Never  Vaping Use   Vaping Use: Never used  Substance and Sexual Activity   Alcohol use: No    Alcohol/week: 0.0 standard drinks   Drug use: No   Sexual activity: Not Currently    Birth control/protection: I.U.D.  Other Topics Concern   Not on file  Social History Narrative   Right handed   Soda: 2-6 per day   Works at [employer].   Married April 2020,second marriage.Lives with husband and 2 kids.      Social Determinants of Health   Financial Resource Strain: Not on file  Food Insecurity: Not on file  Transportation Needs: Not on file  Physical Activity: Not on file  Stress: Not on file  Social Connections: Not on file  Intimate Partner Violence: Not on file    Medications:   Current Outpatient  Medications on File Prior to Visit  Medication Sig Dispense Refill   cetirizine (ZYRTEC) 10 MG tablet Take 1 tablet (10 mg total) by mouth daily. 30 tablet 11   Cholecalciferol (VITAMIN D3) 25 MCG (1000 UT) CAPS Take 1 capsule by mouth daily.     furosemide (LASIX) 20 MG tablet TAKE 1 TABLET(20 MG) BY MOUTH DAILY AS NEEDED FOR FLUID RETENTION 30 tablet 0   ibuprofen (ADVIL) 600 MG tablet Take 1 tablet (600 mg total) by mouth every 6 (six) hours as needed. 30 tablet 0   levETIRAcetam (KEPPRA) 750 MG tablet TAKE 1 TABLET(750 MG) BY MOUTH TWICE DAILY 60 tablet 2   levonorgestrel (MIRENA) 20 MCG/24HR IUD 1 each by Intrauterine route once.     topiramate (TOPAMAX) 50 MG tablet Take 1 tablet (50 mg total) by mouth 2 (two) times daily. (Patient taking differently: Take 50 mg by mouth 3 (three) times daily.) 60 tablet 5   No current facility-administered medications on file prior to visit.    Allergies:  No Known Allergies  Physical Exam General: Morbidly obese mildly anxious young Caucasian lady, seated, in no evident  distress Head: head normocephalic and atraumatic.   Neck: supple with no carotid or supraclavicular bruits Cardiovascular: regular rate and rhythm, no murmurs Musculoskeletal: no deformity Skin:  no rash/petichiae Vascular:  Normal pulses all extremities  Neurologic Exam Mental Status: Awake and fully alert. Oriented to place and time. Recent memory subjectively impaired and remote memory intact. Attention span, concentration and fund of knowledge appropriate during visit. Mood and affect appropriate.   Cranial Nerves: Pupils equal, briskly reactive to light. Extraocular movements full without nystagmus. Visual fields full to confrontation. Hearing intact. Facial sensation intact. Face, tongue, palate moves normally and symmetrically.  Motor: Normal bulk and tone. Normal strength in all tested extremity muscles. Sensory.: intact to touch , pinprick , position and vibratory sensation.  Coordination: Rapid alternating movements normal in all extremities. Finger-to-nose and heel-to-shin performed accurately bilaterally. Gait and Station: Arises from chair without difficulty. Stance is normal. Gait demonstrates normal stride length and balance . Able to heel, toe and tandem walk without difficulty.  Reflexes: 1+ and symmetric. Toes downgoing.      ASSESSMENT: 36 year old Caucasian lady with with childhood history of generalized epilepsy which she had outgrown but started having nocturnal seizures around 2010.  Also complains of short-term memory concerns and frequent headaches. Sleep study 12/2020 negative for sleep apnea     PLAN:  Nocturnal seizures -No additional seizures since prior visit -Continue Keppra 750 mg twice daily for seizure prevention -refill provided -EEG unremarkable -MR brain no acute abnormality; chronic T2/FLAIR hyperintense foci similar appearance from 2017 imaging likely in setting of very minimal chronic microvascular ischemic change or the sequela of migraine  headaches as well as stable right posterior temporal developmental venous anomaly   Daily headaches, chronic -per symptoms, seems to be mix of both daily tension type headaches and approx 15 migrainous type headaches per month -Increase topiramate to 100 mg twice daily -Start tizanidine 4 mg nightly -Start Emgality monthly injectable. First injection provided at office -Intolerant to beta-blocker due to bradycardia, limited benefit on topiramate, hesitant to trial antidepressant due to seizure history -Referral placed (again) to behavioral health as increased stressors and uncontrolled anxiety/depression can trigger migrainous type headache  Short-term memory loss -stable -suspect underlying depression/anxiety largely contributing    Follow-up in 6 months or call earlier if needed   I spent 32 minutes of face-to-face and non-face-to-face time  with patient.  This included previsit chart review, lab review, study review, order entry, electronic health record documentation, patient education and discussion regarding above diagnoses, further treatment options and education and answered all other questions to patient satisfaction   Ihor AustinJessica McCue, Jackson Park HospitalGNP-BC  [facility] [address] [place], [place] [id number]  Phone [phone] Fax [phone] Note: This document was prepared with digital dictation and possible smart phrase technology. Any transcriptional errors that result from this process are unintentional.

## 2021-03-26 NOTE — Addendum Note (Signed)
Addended by: [place] Pink on: 03/26/2021 09:13 AM   Modules accepted: Orders

## 2021-04-02 ENCOUNTER — Ambulatory Visit: Payer: Medicaid Other | Admitting: Internal Medicine

## 2021-04-02 ENCOUNTER — Encounter: Payer: Self-pay | Admitting: Internal Medicine

## 2021-04-02 ENCOUNTER — Other Ambulatory Visit: Payer: Self-pay

## 2021-04-02 VITALS — BP 116/82 | HR 77 | Resp 18 | Ht 62.0 in | Wt 253.1 lb

## 2021-04-02 DIAGNOSIS — Z124 Encounter for screening for malignant neoplasm of cervix: Secondary | ICD-10-CM

## 2021-04-02 DIAGNOSIS — R7303 Prediabetes: Secondary | ICD-10-CM | POA: Diagnosis not present

## 2021-04-02 DIAGNOSIS — R569 Unspecified convulsions: Secondary | ICD-10-CM

## 2021-04-02 DIAGNOSIS — G43719 Chronic migraine without aura, intractable, without status migrainosus: Secondary | ICD-10-CM

## 2021-04-02 DIAGNOSIS — Z1159 Encounter for screening for other viral diseases: Secondary | ICD-10-CM | POA: Diagnosis not present

## 2021-04-02 DIAGNOSIS — E559 Vitamin D deficiency, unspecified: Secondary | ICD-10-CM

## 2021-04-02 DIAGNOSIS — F411 Generalized anxiety disorder: Secondary | ICD-10-CM

## 2021-04-02 DIAGNOSIS — E782 Mixed hyperlipidemia: Secondary | ICD-10-CM | POA: Diagnosis not present

## 2021-04-02 DIAGNOSIS — F331 Major depressive disorder, recurrent, moderate: Secondary | ICD-10-CM | POA: Insufficient documentation

## 2021-04-02 DIAGNOSIS — F32A Depression, unspecified: Secondary | ICD-10-CM | POA: Insufficient documentation

## 2021-04-02 MED ORDER — CITALOPRAM HYDROBROMIDE 10 MG PO TABS: 10.0000 mg | ORAL_TABLET | Freq: Every day | ORAL | 2 refills | Status: DC

## 2021-04-02 NOTE — Patient Instructions (Signed)
Please start taking Celexa as prescribed for anxiety.  Please continue taking other medications as prescribed.  Please continue to follow low carb diet and ambulate as tolerated.

## 2021-04-02 NOTE — Assessment & Plan Note (Signed)
GAD 7 : Generalized Anxiety Score 08/29/2020  Nervous, Anxious, on Edge 3  Control/stop worrying 3  Worry too much - different things 3  Trouble relaxing 3  Restless 3  Easily annoyed or irritable 3  Afraid - awful might happen 3  Total GAD 7 Score 21   Uncontrolled Started Celexa Has been referred to Psychiatry by her Neurologist

## 2021-04-02 NOTE — Assessment & Plan Note (Signed)
Recently started [employer] On Topamax Followed by neurology

## 2021-04-02 NOTE — Progress Notes (Signed)
New Patient Office Visit  Subjective:  Patient ID: Alexandra [facility], female    DOB: 14-Jan-1986  Age: 36 y.o. MRN: [id number]  CC:  Chief Complaint  Patient presents with   New Patient (Initial Visit)    New patient was seeing gosrani just establishing care     HPI Alexandra Garrett is a 36 y.o. female with past medical history of seizure disorder, migraine and GAD who presents for establishing care.  She follows up with neurology for history of seizure disorder and migraine.  She is on Keppra for seizures.  She has not had any seizures for the last 2 years.  She was recently started on Emgality for migraine.  She is also on Topamax for it.  She complains of spells of anxiety, which is worse recently.  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  She is concerned about any antidepressant due to her seizure disorder.  I have counseled her that Celexa usually does not increase seizure risk and would be safe agent to be used for GAD.  She denies any anhedonia, SI or HI currently.  She denies COVID and flu vaccine.  Past Medical History:  Diagnosis Date   Anemia    History of medication noncompliance    Seizures ([place])    Since age 73 yrs    Past Surgical History:  Procedure Laterality Date   NO PAST SURGERIES      Family History  Problem Relation Age of Onset   Arthritis Mother    Depression Mother    Hypertension Mother    Breast cancer Mother        lymph nodes   Heart disease Father    Arthritis Maternal Grandmother    Cancer Maternal Grandmother        breast   Diabetes Maternal Grandmother    Cancer Maternal Grandfather        lung   Stroke Paternal Grandmother    Heart disease Paternal Grandfather     Social History   Socioeconomic History   Marital status: Divorced    Spouse name: Not on file   Number of children: 2   Years of education: some college   Highest education level: Not on file   Occupational History   Not on file  Tobacco Use   Smoking status: Never   Smokeless tobacco: Never  Vaping Use   Vaping Use: Never used  Substance and Sexual Activity   Alcohol use: No    Alcohol/week: 0.0 standard drinks   Drug use: No   Sexual activity: Not Currently    Birth control/protection: I.U.D.  Other Topics Concern   Not on file  Social History Narrative   Right handed   Soda: 2-6 per day   Works at [employer].   Married April 2020,second marriage.Lives with husband and 2 kids.      Social Determinants of Health   Financial Resource Strain: Not on file  Food Insecurity: Not on file  Transportation Needs: Not on file  Physical Activity: Not on file  Stress: Not on file  Social Connections: Not on file  Intimate Partner Violence: Not on file    ROS Review of Systems  Constitutional:  Negative for chills and fever.  HENT:  Negative for congestion, sinus pressure, sinus pain and sore throat.   Eyes:  Negative for pain and discharge.  Respiratory:  Negative for cough and shortness of  breath.   Cardiovascular:  Negative for chest pain and palpitations.  Gastrointestinal:  Negative for abdominal pain, diarrhea, nausea and vomiting.  Endocrine: Negative for polydipsia and polyuria.  Genitourinary:  Negative for dysuria and hematuria.  Musculoskeletal:  Negative for neck pain and neck stiffness.  Skin:  Negative for rash.  Neurological:  Positive for headaches. Negative for dizziness and weakness.  Psychiatric/Behavioral:  Positive for sleep disturbance. Negative for agitation and behavioral problems. The patient is nervous/anxious.    Objective:   Today's Vitals: BP 116/82 (BP Location: Right Arm, Patient Position: Sitting, Cuff Size: Normal)    Pulse 77    Resp 18    Ht '5\' 2"'  (1.575 m)    Wt 253 lb 1.9 oz (114.8 kg)    SpO2 100%    BMI 46.30 kg/m   Physical Exam Vitals reviewed.  Constitutional:      General: She is not in acute distress.     Appearance: She is obese. She is not diaphoretic.  HENT:     Head: Normocephalic and atraumatic.     Nose: Nose normal.     Mouth/Throat:     Mouth: Mucous membranes are moist.  Eyes:     General: No scleral icterus.    Extraocular Movements: Extraocular movements intact.  Cardiovascular:     Rate and Rhythm: Normal rate and regular rhythm.     Pulses: Normal pulses.     Heart sounds: Normal heart sounds. No murmur heard. Pulmonary:     Breath sounds: Normal breath sounds. No wheezing or rales.  Musculoskeletal:     Cervical back: Neck supple. No tenderness.     Right lower leg: No edema.     Left lower leg: No edema.  Skin:    General: Skin is warm.     Findings: No rash.  Neurological:     General: No focal deficit present.     Mental Status: She is alert and oriented to person, place, and time.     Sensory: No sensory deficit.     Motor: No weakness.  Psychiatric:        Mood and Affect: Mood normal.        Behavior: Behavior normal.    Assessment & Plan:   Problem List Items Addressed This Visit       Cardiovascular and Mediastinum   Intractable chronic migraine without aura and without status migrainosus    Recently started Emgality On Topamax Followed by neurology      Relevant Medications   citalopram (CELEXA) 10 MG tablet     Other   Seizures (HCC)    On Keppra Seizure-free for the last 2 years Followed by neurology      Relevant Orders   CBC with Differential/Platelet   GAD (generalized anxiety disorder) - Primary    GAD 7 : Generalized Anxiety Score 08/29/2020  Nervous, Anxious, on Edge 3  Control/stop worrying 3  Worry too much - different things 3  Trouble relaxing 3  Restless 3  Easily annoyed or irritable 3  Afraid - awful might happen 3  Total GAD 7 Score 21  Uncontrolled Started Celexa Has been referred to Psychiatry by her Neurologist       Relevant Medications   citalopram (CELEXA) 10 MG tablet   Other Relevant Orders    CMP14+EGFR   Morbid obesity ([place])    Diet modification and moderate exercise advised      Relevant Orders   TSH   Hemoglobin A1c  Vitamin D deficiency   Relevant Orders   VITAMIN D 25 Hydroxy (Vit-D Deficiency, Fractures)   Mixed hyperlipidemia    Check lipid profile Advised to follow low-carb and low cholesterol diet for now      Relevant Orders   Lipid panel   Other Visit Diagnoses     Prediabetes       Relevant Orders   Hemoglobin A1c   Routine cervical smear       Relevant Orders   Ambulatory referral to Obstetrics / Gynecology   Need for hepatitis C screening test       Relevant Orders   Hepatitis C Antibody       Outpatient Encounter Medications as of 04/02/2021  Medication Sig   cetirizine (ZYRTEC) 10 MG tablet Take 1 tablet (10 mg total) by mouth daily.   Cholecalciferol (VITAMIN D3) 25 MCG (1000 UT) CAPS Take 1 capsule by mouth daily.   citalopram (CELEXA) 10 MG tablet Take 1 tablet (10 mg total) by mouth daily.   Galcanezumab-gnlm (EMGALITY) 120 MG/ML SOAJ Inject 120 mg into the skin every 30 (thirty) days.   levETIRAcetam (KEPPRA) 750 MG tablet Take 1 tablet (750 mg total) by mouth 2 (two) times daily.   levonorgestrel (MIRENA) 20 MCG/24HR IUD 1 each by Intrauterine route once.   tiZANidine (ZANAFLEX) 4 MG tablet Take 1 tablet (4 mg total) by mouth at bedtime.   topiramate (TOPAMAX) 100 MG tablet Take 1 tablet (100 mg total) by mouth 2 (two) times daily.   [DISCONTINUED] furosemide (LASIX) 20 MG tablet TAKE 1 TABLET(20 MG) BY MOUTH DAILY AS NEEDED FOR FLUID RETENTION   [DISCONTINUED] ibuprofen (ADVIL) 600 MG tablet Take 1 tablet (600 mg total) by mouth every 6 (six) hours as needed. (Patient not taking: Reported on 04/02/2021)   Facility-Administered Encounter Medications as of 04/02/2021  Medication   Galcanezumab-gnlm SOAJ 240 mg    Follow-up: Return in about 3 months (around 07/01/2021) for GAD.   Lindell Spar, MD

## 2021-04-02 NOTE — Assessment & Plan Note (Signed)
On Keppra Seizure-free for the last 2 years Followed by neurology 

## 2021-04-02 NOTE — Assessment & Plan Note (Signed)
Check lipid profile Advised to follow low-carb and low cholesterol diet for now

## 2021-04-02 NOTE — Assessment & Plan Note (Signed)
Diet modification and moderate exercise advised. 

## 2021-04-03 DIAGNOSIS — F411 Generalized anxiety disorder: Secondary | ICD-10-CM | POA: Diagnosis not present

## 2021-04-03 DIAGNOSIS — Z1159 Encounter for screening for other viral diseases: Secondary | ICD-10-CM | POA: Diagnosis not present

## 2021-04-03 DIAGNOSIS — R569 Unspecified convulsions: Secondary | ICD-10-CM | POA: Diagnosis not present

## 2021-04-03 DIAGNOSIS — E559 Vitamin D deficiency, unspecified: Secondary | ICD-10-CM | POA: Diagnosis not present

## 2021-04-03 DIAGNOSIS — E782 Mixed hyperlipidemia: Secondary | ICD-10-CM | POA: Diagnosis not present

## 2021-04-03 DIAGNOSIS — R7303 Prediabetes: Secondary | ICD-10-CM | POA: Diagnosis not present

## 2021-04-04 LAB — CBC WITH DIFFERENTIAL/PLATELET
Basophils Absolute: 0.1 10*3/uL (ref 0.0–0.2)
Basos: 1 %
EOS (ABSOLUTE): 0.2 10*3/uL (ref 0.0–0.4)
Eos: 2 %
Hematocrit: 43.7 % (ref 34.0–46.6)
Hemoglobin: 14.2 g/dL (ref 11.1–15.9)
Immature Grans (Abs): 0 10*3/uL (ref 0.0–0.1)
Immature Granulocytes: 0 %
Lymphocytes Absolute: 3.4 10*3/uL — ABNORMAL HIGH (ref 0.7–3.1)
Lymphs: 35 %
MCH: 28.5 pg (ref 26.6–33.0)
MCHC: 32.5 g/dL (ref 31.5–35.7)
MCV: 88 fL (ref 79–97)
Monocytes Absolute: 0.7 10*3/uL (ref 0.1–0.9)
Monocytes: 7 %
Neutrophils Absolute: 5.2 10*3/uL (ref 1.4–7.0)
Neutrophils: 55 %
Platelets: 365 10*3/uL (ref 150–450)
RBC: 4.98 x10E6/uL (ref 3.77–5.28)
RDW: 13.3 % (ref 11.7–15.4)
WBC: 9.6 10*3/uL (ref 3.4–10.8)

## 2021-04-04 LAB — TSH: TSH: 2.11 u[IU]/mL (ref 0.450–4.500)

## 2021-04-04 LAB — HEMOGLOBIN A1C
Est. average glucose Bld gHb Est-mCnc: 111 mg/dL
Hgb A1c MFr Bld: 5.5 % (ref 4.8–5.6)

## 2021-04-04 LAB — CMP14+EGFR
ALT: 14 IU/L (ref 0–32)
AST: 16 IU/L (ref 0–40)
Albumin/Globulin Ratio: 1.4 (ref 1.2–2.2)
Albumin: 4.1 g/dL (ref 3.8–4.8)
Alkaline Phosphatase: 98 IU/L (ref 44–121)
BUN/Creatinine Ratio: 13 (ref 9–23)
BUN: 12 mg/dL (ref 6–20)
Bilirubin Total: 0.4 mg/dL (ref 0.0–1.2)
CO2: 18 mmol/L — ABNORMAL LOW (ref 20–29)
Calcium: 8.8 mg/dL (ref 8.7–10.2)
Chloride: 107 mmol/L — ABNORMAL HIGH (ref 96–106)
Creatinine, Ser: 0.9 mg/dL (ref 0.57–1.00)
Globulin, Total: 2.9 g/dL (ref 1.5–4.5)
Glucose: 94 mg/dL (ref 70–99)
Potassium: 4.4 mmol/L (ref 3.5–5.2)
Sodium: 139 mmol/L (ref 134–144)
Total Protein: 7 g/dL (ref 6.0–8.5)
eGFR: 85 mL/min/{1.73_m2} (ref >59)

## 2021-04-04 LAB — LIPID PANEL
Chol/HDL Ratio: 4.4 ratio (ref 0.0–4.4)
Cholesterol, Total: 185 mg/dL (ref 100–199)
HDL: 42 mg/dL (ref >39)
LDL Chol Calc (NIH): 125 mg/dL — ABNORMAL HIGH (ref 0–99)
Triglycerides: 98 mg/dL (ref 0–149)
VLDL Cholesterol Cal: 18 mg/dL (ref 5–40)

## 2021-04-04 LAB — VITAMIN D 25 HYDROXY (VIT D DEFICIENCY, FRACTURES): Vit D, 25-Hydroxy: 22.2 ng/mL — ABNORMAL LOW (ref 30.0–100.0)

## 2021-04-04 LAB — HEPATITIS C ANTIBODY: Hep C Virus Ab: <0.1 s/co ratio (ref 0.0–0.9)

## 2021-04-10 ENCOUNTER — Other Ambulatory Visit: Payer: Self-pay

## 2021-04-10 ENCOUNTER — Ambulatory Visit
Admission: EM | Admit: 2021-04-10 | Discharge: 2021-04-10 | Disposition: A | Discharge status: Home / Self Care | Payer: Medicaid Other

## 2021-04-10 DIAGNOSIS — H209 Unspecified iridocyclitis: Secondary | ICD-10-CM

## 2021-04-10 DIAGNOSIS — H5789 Other specified disorders of eye and adnexa: Secondary | ICD-10-CM

## 2021-04-10 DIAGNOSIS — H53141 Visual discomfort, right eye: Secondary | ICD-10-CM

## 2021-04-10 NOTE — Discharge Instructions (Addendum)
I have secured an appointment with you with [facility] at 1:15 PM.  Please make sure that you get there 15 minutes ahead of time.

## 2021-04-10 NOTE — ED Provider Notes (Signed)
[facility]   MRN: [id number] DOB: 1985-08-15  Subjective:   Alexandra Garrett is a 36 y.o. female presenting for 4-day history of acute onset persistent and worsening right eye redness, right eye swelling, right eye pain, photophobia and intermittent blurred vision.  Denies any eye trauma, eye drainage, eyelid swelling or pain.  Patient has been using warm compresses without any relief.  She does not wear any contact lenses.  No history of glaucoma that she knows of.   Current Facility-Administered Medications:    Galcanezumab-gnlm SOAJ 240 mg, 240 mg, Subcutaneous, Once, Ihor Austin, NP  Current Outpatient Medications:    cetirizine (ZYRTEC) 10 MG tablet, Take 1 tablet (10 mg total) by mouth daily., Disp: 30 tablet, Rfl: 11   Cholecalciferol (VITAMIN D3) 25 MCG (1000 UT) CAPS, Take 1 capsule by mouth daily., Disp: , Rfl:    citalopram (CELEXA) 10 MG tablet, Take 1 tablet (10 mg total) by mouth daily., Disp: 30 tablet, Rfl: 2   Galcanezumab-gnlm (EMGALITY) 120 MG/ML SOAJ, Inject 120 mg into the skin every 30 (thirty) days., Disp: 1.12 mL, Rfl: 11   levETIRAcetam (KEPPRA) 750 MG tablet, Take 1 tablet (750 mg total) by mouth 2 (two) times daily., Disp: 180 tablet, Rfl: 3   levonorgestrel (MIRENA) 20 MCG/24HR IUD, 1 each by Intrauterine route once., Disp: , Rfl:    tiZANidine (ZANAFLEX) 4 MG tablet, Take 1 tablet (4 mg total) by mouth at bedtime., Disp: 30 tablet, Rfl: 11   topiramate (TOPAMAX) 100 MG tablet, Take 1 tablet (100 mg total) by mouth 2 (two) times daily., Disp: 60 tablet, Rfl: 11   No Known Allergies  Past Medical History:  Diagnosis Date   Anemia    History of medication noncompliance    Seizures (HCC)    Since age 9 yrs     Past Surgical History:  Procedure Laterality Date   NO PAST SURGERIES      Family History  Problem Relation Age of Onset   Arthritis Mother    Depression Mother    Hypertension Mother    Breast cancer Mother         lymph nodes   Heart disease Father    Arthritis Maternal Grandmother    Cancer Maternal Grandmother        breast   Diabetes Maternal Grandmother    Cancer Maternal Grandfather        lung   Stroke Paternal Grandmother    Heart disease Paternal Grandfather     Social History   Tobacco Use   Smoking status: Never   Smokeless tobacco: Never  Vaping Use   Vaping Use: Never used  Substance Use Topics   Alcohol use: No    Alcohol/week: 0.0 standard drinks   Drug use: No    ROS   Objective:   Vitals: BP 113/80 (BP Location: Right Arm)    Pulse 65    Temp 98 F (36.7 C) (Oral)    Resp 16    SpO2 98%   Physical Exam Constitutional:      General: She is not in acute distress.    Appearance: Normal appearance. She is well-developed. She is not ill-appearing, toxic-appearing or diaphoretic.  HENT:     Head: Normocephalic and atraumatic.     Nose: Nose normal.     Mouth/Throat:     Mouth: Mucous membranes are moist.  Eyes:     General: Lids are everted, no foreign bodies appreciated. No scleral icterus.  Right eye: Discharge (clear, watery) present. No foreign body or hordeolum.        Left eye: No foreign body, discharge or hordeolum.     Extraocular Movements: Extraocular movements intact.     Right eye: Normal extraocular motion and no nystagmus.     Left eye: Normal extraocular motion and no nystagmus.     Conjunctiva/sclera:     Right eye: Right conjunctiva is injected. No chemosis, exudate or hemorrhage.    Left eye: Left conjunctiva is not injected. No chemosis, exudate or hemorrhage.     Comments: Frank photophobia.    Cardiovascular:     Rate and Rhythm: Normal rate.  Pulmonary:     Effort: Pulmonary effort is normal.  Skin:    General: Skin is warm and dry.  Neurological:     General: No focal deficit present.     Mental Status: She is alert and oriented to person, place, and time.  Psychiatric:        Mood and Affect: Mood normal.        Behavior:  Behavior normal.    Eye Exam: Eyelids everted and swept for foreign body. The eye was anesthetized with 2 drops of tetracaine and stained with fluorescein. Examination under woods lamp does not reveal a foreign body or area of increased stain uptake. The eye was then irrigated copiously with saline.   Assessment and Plan :   PDMP not reviewed this encounter.  1. Iritis   2. Red eye   3. Photophobia of right eye    Concern is primarily for iritis but given the vision changes, the acute onset in right eye, counseled that she may also have glaucoma.  These are both ophthalmology urgent cases. I discussed her case with Dr. [facility] practice. They will see her today at 1:15pm. Patient contracts for safety and will report there today.   Wallis Bamberg, [place] 04/10/21 1044

## 2021-04-10 NOTE — ED Triage Notes (Signed)
Patient states since that 4 days ago she woke up and her right eye was red  Patient states its very irritated and it is swollen   Patient states she tried a hot compress and she feels that it hurts it worse  Denies Fever

## 2021-05-09 DIAGNOSIS — R569 Unspecified convulsions: Secondary | ICD-10-CM | POA: Diagnosis not present

## 2021-05-15 ENCOUNTER — Encounter: Payer: Self-pay | Admitting: Adult Health

## 2021-05-15 ENCOUNTER — Other Ambulatory Visit (HOSPITAL_COMMUNITY)
Admission: RE | Admit: 2021-05-15 | Discharge: 2021-05-15 | Disposition: A | Discharge status: Home / Self Care | Payer: Medicaid Other | Source: Ambulatory Visit | Attending: Adult Health | Admitting: Adult Health

## 2021-05-15 ENCOUNTER — Ambulatory Visit: Payer: Medicaid Other | Admitting: Adult Health

## 2021-05-15 ENCOUNTER — Other Ambulatory Visit: Payer: Self-pay

## 2021-05-15 VITALS — BP 112/79 | HR 52 | Ht 62.0 in | Wt 248.0 lb

## 2021-05-15 DIAGNOSIS — Z124 Encounter for screening for malignant neoplasm of cervix: Secondary | ICD-10-CM | POA: Diagnosis not present

## 2021-05-15 DIAGNOSIS — Z975 Presence of (intrauterine) contraceptive device: Secondary | ICD-10-CM | POA: Diagnosis not present

## 2021-05-15 DIAGNOSIS — Z01419 Encounter for gynecological examination (general) (routine) without abnormal findings: Secondary | ICD-10-CM | POA: Insufficient documentation

## 2021-05-15 NOTE — Progress Notes (Signed)
Patient ID: Alexandra Garrett, female   DOB: 05/21/1985, 36 y.o.   MRN: [id number] ?History of Present Illness: ?Alexandra Garrett is a 36 year old white female, married, G2P2 in for a well woman gyn exam and pap. She is a new pt and is new to Dr Allena Katz.  ?PCP is Dr Allena Katz. ? ? ?Current Medications, Allergies, Past Medical History, Past Surgical History, Family History and Social History were reviewed in [employer] record.   ? ? ?Review of Systems: ?Patient denies any headaches, hearing loss, fatigue, blurred vision, shortness of breath, chest pain, abdominal pain, problems with bowel movements, urination, or intercourse. No joint pain or mood swings.  ?No period with IUD. ? ? ?Physical Exam:BP 112/79 (BP Location: Right Arm, Patient Position: Sitting, Cuff Size: Normal)   Pulse (!) 52   Ht 5\' 2"  (1.575 m)   Wt 248 lb (112.5 kg)   LMP  (LMP Unknown)   BMI 45.36 kg/m?   ?General:  Well developed, well nourished, no acute distress ?Skin:  Warm and dry ?Neck:  Midline trachea, normal thyroid, good ROM, no lymphadenopathy ?Lungs; Clear to auscultation bilaterally ?Breast:  No dominant palpable mass, retraction, or nipple discharge ?Cardiovascular: Regular rate and rhythm ?Abdomen:  Soft, non tender, no hepatosplenomegaly,obese ?Pelvic:  External genitalia is normal in appearance, no lesions.  The vagina is normal in appearance. Urethra has no lesions or masses. The cervix is bulbous, pap with HR HPV genotyping performed, +IUD strings at os.  Uterus is felt to be normal size, shape, and contour.  No adnexal masses or tenderness noted.Bladder is non tender, no masses felt. ?Rectal:Deferred ?Extremities/musculoskeletal:  No swelling or varicosities noted, no clubbing or cyanosis ?Psych:  No mood changes, alert and cooperative,seems happy ?AA is 0 ?Fall risk is moderate ?Depression screen [facility] 2/9 05/15/2021 04/02/2021 06/11/2020  ?Decreased Interest 0 0 0  ?Down, Depressed, Hopeless 0 0 0  ?PHQ - 2 Score 0 0 0   ?Altered sleeping 0 - 0  ?Tired, decreased energy 0 - 0  ?Change in appetite 0 - 0  ?Feeling bad or failure about yourself  0 - 0  ?Trouble concentrating 0 - 0  ?Moving slowly or fidgety/restless 0 - 0  ?Suicidal thoughts 0 - 0  ?PHQ-9 Score 0 - 0  ?Difficult doing work/chores - - Not difficult at all  ?  ?GAD 7 : Generalized Anxiety Score 05/15/2021 08/29/2020  ?Nervous, Anxious, on Edge 0 3  ?Control/stop worrying 0 3  ?Worry too much - different things 0 3  ?Trouble relaxing 0 3  ?Restless 0 3  ?Easily annoyed or irritable 0 3  ?Afraid - awful might happen 0 3  ?Total GAD 7 Score 0 21  ? ? [place] - 05/15/21 0922   ? ?  ? Pregnancy Intention Screening  ? Does the patient want to become pregnant in the next year? No   ? Does the patient's partner want to become pregnant in the next year? No   ? Would the patient like to discuss contraceptive options today? No   ?  ? Contraception Wrap Up  ? Current Method IUD or IUS   ? End Method IUD or IUS   ? Contraception Counseling Provided No   ? ?  ?  ? ?  ? Examination chaperoned by 07/15/21 RN ? ?  ?Impression and Plan: ? ?1. Routine cervical smear ?Pap sent ? ?2. Encounter for gynecological examination with Papanicolaou smear of cervix ?Pap in 3 years  if normal ?Get physical with PCP next year ? ?3. IUD (intrauterine device) in place ?Mirena placed 12/19/2015 ?Return about 12/09/2023 for removal and replacement if desired  ? ? ?  ?  ?

## 2021-05-16 LAB — CYTOLOGY - PAP
Comment: NEGATIVE
Diagnosis: NEGATIVE
High risk HPV: NEGATIVE

## 2021-06-15 ENCOUNTER — Emergency Department (HOSPITAL_COMMUNITY)
Admission: EM | Admit: 2021-06-15 | Discharge: 2021-06-15 | Disposition: A | Discharge status: Home / Self Care | Payer: Medicaid Other | Attending: Emergency Medicine | Admitting: Emergency Medicine

## 2021-06-15 ENCOUNTER — Emergency Department (HOSPITAL_COMMUNITY): Payer: Medicaid Other

## 2021-06-15 ENCOUNTER — Other Ambulatory Visit: Payer: Self-pay

## 2021-06-15 ENCOUNTER — Encounter (HOSPITAL_COMMUNITY): Payer: Self-pay

## 2021-06-15 DIAGNOSIS — Y990 Civilian activity done for income or pay: Secondary | ICD-10-CM | POA: Diagnosis not present

## 2021-06-15 DIAGNOSIS — S060X0A Concussion without loss of consciousness, initial encounter: Secondary | ICD-10-CM | POA: Diagnosis not present

## 2021-06-15 DIAGNOSIS — W19XXXA Unspecified fall, initial encounter: Secondary | ICD-10-CM

## 2021-06-15 DIAGNOSIS — S0990XA Unspecified injury of head, initial encounter: Secondary | ICD-10-CM | POA: Diagnosis present

## 2021-06-15 DIAGNOSIS — W01198A Fall on same level from slipping, tripping and stumbling with subsequent striking against other object, initial encounter: Secondary | ICD-10-CM | POA: Diagnosis not present

## 2021-06-15 DIAGNOSIS — R270 Ataxia, unspecified: Secondary | ICD-10-CM | POA: Diagnosis not present

## 2021-06-15 LAB — CBC WITH DIFFERENTIAL/PLATELET
Abs Immature Granulocytes: 0.04 10*3/uL (ref 0.00–0.07)
Basophils Absolute: 0.1 10*3/uL (ref 0.0–0.1)
Basophils Relative: 1 %
Eosinophils Absolute: 0.2 10*3/uL (ref 0.0–0.5)
Eosinophils Relative: 1 %
HCT: 44.3 % (ref 36.0–46.0)
Hemoglobin: 14.1 g/dL (ref 12.0–15.0)
Immature Granulocytes: 0 %
Lymphocytes Relative: 35 %
Lymphs Abs: 4.1 10*3/uL — ABNORMAL HIGH (ref 0.7–4.0)
MCH: 28.2 pg (ref 26.0–34.0)
MCHC: 31.8 g/dL (ref 30.0–36.0)
MCV: 88.6 fL (ref 80.0–100.0)
Monocytes Absolute: 0.7 10*3/uL (ref 0.1–1.0)
Monocytes Relative: 6 %
Neutro Abs: 6.7 10*3/uL (ref 1.7–7.7)
Neutrophils Relative %: 57 %
Platelets: 313 10*3/uL (ref 150–400)
RBC: 5 MIL/uL (ref 3.87–5.11)
RDW: 13.8 % (ref 11.5–15.5)
WBC: 11.8 10*3/uL — ABNORMAL HIGH (ref 4.0–10.5)
nRBC: 0 % (ref 0.0–0.2)

## 2021-06-15 MED ORDER — ACETAMINOPHEN 325 MG PO TABS
650.0000 mg | ORAL_TABLET | Freq: Once | ORAL | Status: AC
  Administered 2021-06-15: 650 mg via ORAL
  Filled 2021-06-15: qty 2

## 2021-06-15 NOTE — ED Triage Notes (Addendum)
Pt had a fall at work around 2000 and hit the back of her head, pt husband states she's having trouble talking and is in severe pain. Pt is actively crying in triage, is able to nod yes and no but states she is having trouble getting her words out.  Denies taking blood thinners.  ?

## 2021-06-15 NOTE — ED Notes (Signed)
C-collar placed on pt.

## 2021-06-15 NOTE — ED Notes (Signed)
Pt ambulated down hall with steady gait

## 2021-06-15 NOTE — Discharge Instructions (Addendum)
You were seen in the emergency department today after a fall and striking your head.  This is resulted in you having a concussion.  Please return to the emergency department if you begin having worsening confusion, vomiting, increased lethargy with difficulty waking.  Otherwise please follow-up with the concussion clinic.  He may call them tomorrow.  This is at [facility].  Please set up for follow-up appointment for soon as possible.  Continue to take Tylenol and Motrin as needed for headache. ?

## 2021-06-15 NOTE — ED Provider Notes (Signed)
?[facility] ?Provider Note ? ? ?CSN: [id number] ?Arrival date & time: 06/15/21  2117 ? ?  ? ?History ? ?Chief Complaint  ?Patient presents with  ? Fall  ? ? ?Alexandra Garrett is a 36 y.o. female. With past medical history of migraines, seizures, GAD who presents to the emergency department after fall. ? ?Was at work tonight ~2000 when she slipped and fell on the wet floor striking the back of her head. No reported LOC. She was driven home by family member at bedside who states she noticed she wasn't talking much. States they got home and she was having difficulty speaking so brought her here. No report nausea or vomiting. No reported seizure-like activity. Not anticoagulated.  ? ? ?Fall ?Associated symptoms include headaches.  ? ?  ? ?Home Medications ?Prior to Admission medications   ?Medication Sig Start Date End Date Taking? Authorizing Provider  ?cetirizine (ZYRTEC) 10 MG tablet Take 1 tablet (10 mg total) by mouth daily. 06/20/19   Scot Jun, FNP  ?Cholecalciferol (VITAMIN D3) 25 MCG (1000 UT) CAPS Take 1 capsule by mouth daily.    [provider]  ?citalopram (CELEXA) 10 MG tablet Take 1 tablet (10 mg total) by mouth daily. 04/02/21   Lindell Spar, MD  ?Galcanezumab-gnlm [facility]) 120 MG/ML SOAJ Inject 120 mg into the skin every 30 (thirty) days. 03/26/21   Frann Rider, NP  ?levETIRAcetam (KEPPRA) 750 MG tablet Take 1 tablet (750 mg total) by mouth 2 (two) times daily. 03/26/21   Frann Rider, NP  ?levonorgestrel (MIRENA) 20 MCG/24HR IUD 1 each by Intrauterine route once.    [provider]  ?tiZANidine (ZANAFLEX) 4 MG tablet Take 1 tablet (4 mg total) by mouth at bedtime. 03/26/21   Frann Rider, NP  ?topiramate (TOPAMAX) 100 MG tablet Take 1 tablet (100 mg total) by mouth 2 (two) times daily. 03/26/21   Frann Rider, NP  ?   ? ?Allergies    ?Patient has no known allergies.   ? ?Review of Systems   ?Review of Systems  ?Eyes:  Negative for visual disturbance.   ?Gastrointestinal:  Negative for nausea, rectal pain and vomiting.  ?Musculoskeletal:  Positive for back pain and neck pain.  ?Neurological:  Positive for speech difficulty, light-headedness and headaches. Negative for seizures and syncope.  ?Psychiatric/Behavioral:  Positive for confusion. Negative for agitation.   ?All other systems reviewed and are negative. ? ?Physical Exam ?Updated Vital Signs ?BP 115/80 (BP Location: Right Arm)   Pulse (!) 57   Temp 98 ?F (36.7 ?C) (Oral)   Resp (!) 22   Ht 5\' 2"  (1.575 m)   Wt 113 kg   SpO2 100%   BMI 45.56 kg/m?  ?Physical Exam ?Vitals and nursing note reviewed.  ?Constitutional:   ?   General: She is not in acute distress. ?   Appearance: She is obese. She is ill-appearing. She is not toxic-appearing or diaphoretic.  ?HENT:  ?   Head: Normocephalic and atraumatic.  ?   Right Ear: Tympanic membrane normal.  ?   Left Ear: Tympanic membrane normal.  ?   Nose: Nose normal.  ?   Mouth/Throat:  ?   Mouth: Mucous membranes are moist.  ?   Pharynx: Oropharynx is clear. No posterior oropharyngeal erythema.  ?Eyes:  ?   General: Vision grossly intact. Gaze aligned appropriately. No scleral icterus. ?   Extraocular Movements: Extraocular movements intact.  ?   Right eye: Nystagmus present.  ?  Left eye: Nystagmus present.  ?   Conjunctiva/sclera: Conjunctivae normal.  ?   Pupils: Pupils are equal, round, and reactive to light.  ?   Comments: Fatigable horizontal nystagmus  ?No battle signs or racoon eyes   ?Neck:  ?   Comments: In c-collar on arrival  ?Cardiovascular:  ?   Rate and Rhythm: Normal rate and regular rhythm.  ?   Pulses: Normal pulses.  ?   Heart sounds: No murmur heard. ?Pulmonary:  ?   Effort: Pulmonary effort is normal. No respiratory distress.  ?   Breath sounds: Normal breath sounds.  ?Abdominal:  ?   General: There is no distension.  ?   Palpations: Abdomen is soft.  ?   Tenderness: There is no abdominal tenderness.  ?Musculoskeletal:     ?   General: No  swelling, deformity or signs of injury. Normal range of motion.  ?   Cervical back: Spinous process tenderness present.  ?Skin: ?   General: Skin is warm and dry.  ?   Capillary Refill: Capillary refill takes less than 2 seconds.  ?   Findings: No bruising or rash.  ?Neurological:  ?   Mental Status: She is alert.  ?   GCS: GCS eye subscore is 4. GCS verbal subscore is 5. GCS motor subscore is 6.  ?   Cranial Nerves: Cranial nerves 2-12 are intact.  ?   Sensory: Sensation is intact.  ?   Motor: Motor function is intact.  ?   Gait: Gait is intact.  ?   Comments: Patient with GCS 15 although she is very delayed in response verbally. Has difficulty with speaking. When she does answer, she is oriented.   ?Psychiatric:     ?   Attention and Perception: Attention and perception normal.     ?   Mood and Affect: Mood is anxious. Affect is tearful.     ?   Speech: Speech is delayed.     ?   Behavior: Behavior is slowed. Behavior is cooperative.     ?   Thought Content: Thought content normal.  ? ? ?ED Results / Procedures / Treatments   ?Labs ?(all labs ordered are listed, but only abnormal results are displayed) ?Labs Reviewed  ?CBC WITH DIFFERENTIAL/PLATELET - Abnormal; Notable for the following components:  ?    Result Value  ? WBC 11.8 (*)   ? Lymphs Abs 4.1 (*)   ? All other components within normal limits  ?COMPREHENSIVE METABOLIC PANEL - Abnormal; Notable for the following components:  ? Chloride 112 (*)   ? CO2 20 (*)   ? BUN 21 (*)   ? Calcium 8.4 (*)   ? AST 14 (*)   ? All other components within normal limits  ? ?EKG ?None ? ?Radiology ?CT Head Wo Contrast ? ?Result Date: 06/15/2021 ?CLINICAL DATA:  Ataxia, fall EXAM: CT HEAD WITHOUT CONTRAST CT CERVICAL SPINE WITHOUT CONTRAST TECHNIQUE: Multidetector CT imaging of the head and cervical spine was performed following the standard protocol without intravenous contrast. Multiplanar CT image reconstructions of the cervical spine were also generated. RADIATION DOSE  REDUCTION: This exam was performed according to the departmental dose-optimization program which includes automated exposure control, adjustment of the mA and/or kV according to patient size and/or use of iterative reconstruction technique. COMPARISON:  CT brain 08/11/2015 FINDINGS: CT HEAD FINDINGS Brain: No evidence of acute infarction, hemorrhage, hydrocephalus, extra-axial collection or mass lesion/mass effect. Vascular: No hyperdense vessel or unexpected calcification. Skull:  Normal. Negative for fracture or focal lesion. Sinuses/Orbits: No acute finding. Other: None CT CERVICAL SPINE FINDINGS Alignment: Straightening of the cervical spine. No subluxation. Facet alignment within normal limits Skull base and vertebrae: No acute fracture. No primary bone lesion or focal pathologic process. Soft tissues and spinal canal: No prevertebral fluid or swelling. No visible canal hematoma. Disc levels:  Within normal limits Upper chest: Negative. Other: None IMPRESSION: 1. Negative non contrasted CT appearance of the brain. 2. Straightening of the cervical spine. No acute osseous abnormality Electronically Signed   By: Donavan Foil M.D.   On: 06/15/2021 22:34  ? ?CT Cervical Spine Wo Contrast ? ?Result Date: 06/15/2021 ?CLINICAL DATA:  Ataxia, fall EXAM: CT HEAD WITHOUT CONTRAST CT CERVICAL SPINE WITHOUT CONTRAST TECHNIQUE: Multidetector CT imaging of the head and cervical spine was performed following the standard protocol without intravenous contrast. Multiplanar CT image reconstructions of the cervical spine were also generated. RADIATION DOSE REDUCTION: This exam was performed according to the departmental dose-optimization program which includes automated exposure control, adjustment of the mA and/or kV according to patient size and/or use of iterative reconstruction technique. COMPARISON:  CT brain 08/11/2015 FINDINGS: CT HEAD FINDINGS Brain: No evidence of acute infarction, hemorrhage, hydrocephalus, extra-axial  collection or mass lesion/mass effect. Vascular: No hyperdense vessel or unexpected calcification. Skull: Normal. Negative for fracture or focal lesion. Sinuses/Orbits: No acute finding. Other: None CT CERVICAL SPINE

## 2021-06-16 LAB — COMPREHENSIVE METABOLIC PANEL
ALT: 19 U/L (ref 0–44)
AST: 14 U/L — ABNORMAL LOW (ref 15–41)
Albumin: 3.8 g/dL (ref 3.5–5.0)
Alkaline Phosphatase: 64 U/L (ref 38–126)
Anion gap: 6 (ref 5–15)
BUN: 21 mg/dL — ABNORMAL HIGH (ref 6–20)
CO2: 20 mmol/L — ABNORMAL LOW (ref 22–32)
Calcium: 8.4 mg/dL — ABNORMAL LOW (ref 8.9–10.3)
Chloride: 112 mmol/L — ABNORMAL HIGH (ref 98–111)
Creatinine, Ser: 0.88 mg/dL (ref 0.44–1.00)
GFR, Estimated: >60 mL/min (ref >60)
Glucose, Bld: 89 mg/dL (ref 70–99)
Potassium: 3.5 mmol/L (ref 3.5–5.1)
Sodium: 138 mmol/L (ref 135–145)
Total Bilirubin: 0.4 mg/dL (ref 0.3–1.2)
Total Protein: 7 g/dL (ref 6.5–8.1)

## 2021-06-17 ENCOUNTER — Telehealth: Payer: Self-pay

## 2021-06-17 NOTE — Telephone Encounter (Signed)
Transition Care Management Follow-up Telephone Call ?Date of discharge and from where: 06/15/2021-[facility]  ?How have you been since you were released from the hospital? Pt stated she still has some pain but will contact her PCP if worsen or don't get better.  ?Any questions or concerns? No ? ?Items Reviewed: ?Did the pt receive and understand the discharge instructions provided? Yes  ?Medications obtained and verified?  No new medications given at discharge.  ?Other? No  ?Any new allergies since your discharge? No  ?Dietary orders reviewed? No ?Do you have support at home? Yes  ? ?Home Care and Equipment/Supplies: ?Were home health services ordered? not applicable ?If so, what is the name of the agency? N/A  ?Has the agency set up a time to come to the patient's home? not applicable ?Were any new equipment or medical supplies ordered?  No ?What is the name of the medical supply agency? N/A ?Were you able to get the supplies/equipment? not applicable ?Do you have any questions related to the use of the equipment or supplies? No ? ?Functional Questionnaire: (I = Independent and D = Dependent) ?ADLs: I ? ?Bathing/Dressing- I ? ?Meal Prep- I ? ?Eating- I ? ?Maintaining continence- I ? ?Transferring/Ambulation- I ? ?Managing Meds- I ? ?Follow up appointments reviewed: ? ?PCP Hospital f/u appt confirmed? No   ?Specialist Hospital f/u appt confirmed? No   ?Are transportation arrangements needed? No  ?If their condition worsens, is the pt aware to call PCP or go to the Emergency Dept.? Yes ?Was the patient provided with contact information for the PCP's office or ED? Yes ?Was to pt encouraged to call back with questions or concerns? Yes  ?

## 2021-07-05 ENCOUNTER — Ambulatory Visit: Payer: Medicaid Other | Admitting: Internal Medicine

## 2021-07-05 ENCOUNTER — Encounter: Payer: Self-pay | Admitting: Internal Medicine

## 2021-07-05 VITALS — BP 108/72 | HR 50 | Resp 18 | Ht 62.0 in | Wt 246.2 lb

## 2021-07-05 DIAGNOSIS — G43719 Chronic migraine without aura, intractable, without status migrainosus: Secondary | ICD-10-CM | POA: Diagnosis not present

## 2021-07-05 DIAGNOSIS — Z09 Encounter for follow-up examination after completed treatment for conditions other than malignant neoplasm: Secondary | ICD-10-CM | POA: Diagnosis not present

## 2021-07-05 DIAGNOSIS — R569 Unspecified convulsions: Secondary | ICD-10-CM

## 2021-07-05 DIAGNOSIS — F411 Generalized anxiety disorder: Secondary | ICD-10-CM | POA: Diagnosis not present

## 2021-07-05 DIAGNOSIS — E559 Vitamin D deficiency, unspecified: Secondary | ICD-10-CM

## 2021-07-05 MED ORDER — CITALOPRAM HYDROBROMIDE 10 MG PO TABS: 10.0000 mg | ORAL_TABLET | Freq: Every day | ORAL | 1 refills | Status: DC

## 2021-07-05 NOTE — Progress Notes (Signed)
? ?Established Patient Office Visit ? ?Subjective:  ?Patient ID: Alexandra Garrett, female    DOB: Jun 08, 1985  Age: 36 y.o. MRN: [id number] ? ?CC:  ?Chief Complaint  ?Patient presents with  ? Follow-up  ?  3 month follow up GAD anxiety is better than it was   ? ? ?HPI ?Alexandra Garrett is a 36 y.o. female with past medical history of seizure disorder, migraine and GAD who presents for f/u of her chronic medical conditions. ? ?She follows up with neurology for history of seizure disorder and migraine.  She is on Keppra for seizures.  She has not had any seizures for the last 2 years.  She was recently started on Emgality for migraine, but has not been able to continue it.  She is also on Topamax for it. ? ?Her anxiety is well controlled with Celexa now.  She denies any episode of severe anxiety or panic.  Denies any anhedonia, SI or HI. ? ?She recently had mechanical fall at home due to wet floor.  She was taken to ER as she was having confusion, her CT head was unremarkable.  She was told about concussion injury.  She denies any episode of confusion or seizure-like activity since then. ? ?Past Medical History:  ?Diagnosis Date  ? Anemia   ? History of medication noncompliance   ? Seizures ([place])   ? Since age 40 yrs  ? ? ?Past Surgical History:  ?Procedure Laterality Date  ? NO PAST SURGERIES    ? ? ?Family History  ?Problem Relation Age of Onset  ? Arthritis Mother   ? Depression Mother   ? Hypertension Mother   ? Breast cancer Mother   ?     lymph nodes  ? Heart disease Father   ? Arthritis Maternal Grandmother   ? Cancer Maternal Grandmother   ?     breast  ? Diabetes Maternal Grandmother   ? Cancer Maternal Grandfather   ?     lung  ? Stroke Paternal Grandmother   ? Heart disease Paternal Grandfather   ? ? ?Social History  ? ?Socioeconomic History  ? Marital status: Married  ?  Spouse name: Not on file  ? Number of children: 2  ? Years of education: some college  ? Highest education level: Not on file  ?Occupational  History  ? Not on file  ?Tobacco Use  ? Smoking status: Never  ? Smokeless tobacco: Never  ?Vaping Use  ? Vaping Use: Never used  ?Substance and Sexual Activity  ? Alcohol use: No  ?  Alcohol/week: 0.0 standard drinks  ? Drug use: No  ? Sexual activity: Yes  ?  Birth control/protection: I.U.D.  ?Other Topics Concern  ? Not on file  ?Social History Narrative  ? Right handed  ? Soda: 2-6 per day  ? Works at [employer].  ? Married April 2020,second marriage.Lives with husband and 2 kids.  ?   ? ?Social Determinants of Health  ? ?Financial Resource Strain: Low Risk   ? Difficulty of Paying Living Expenses: Not very hard  ?Food Insecurity: No Food Insecurity  ? Worried About Charity fundraiser in the Last Year: Never true  ? Ran Out of Food in the Last Year: Never true  ?Transportation Needs: No Transportation Needs  ? Lack of Transportation (Medical): No  ? Lack of Transportation (Non-Medical): No  ?Physical Activity: Insufficiently Active  ? Days of Exercise per Week: 5 days  ? Minutes of Exercise  per Session: 20 min  ?Stress: Stress Concern Present  ? Feeling of Stress : To some extent  ?Social Connections: Socially Integrated  ? Frequency of Communication with Friends and Family: More than three times a week  ? Frequency of Social Gatherings with Friends and Family: Once a week  ? Attends Religious Services: More than 4 times per year  ? Active Member of Clubs or Organizations: Yes  ? Attends Archivist Meetings: More than 4 times per year  ? Marital Status: Married  ?Intimate Partner Violence: Not At Risk  ? Fear of Current or Ex-Partner: No  ? Emotionally Abused: No  ? Physically Abused: No  ? Sexually Abused: No  ? ? ?Outpatient Medications Prior to Visit  ?Medication Sig Dispense Refill  ? cetirizine (ZYRTEC) 10 MG tablet Take 1 tablet (10 mg total) by mouth daily. 30 tablet 11  ? Cholecalciferol (VITAMIN D3) 25 MCG (1000 UT) CAPS Take 1 capsule by mouth daily.    ? Galcanezumab-gnlm (EMGALITY) 120  MG/ML SOAJ Inject 120 mg into the skin every 30 (thirty) days. 1.12 mL 11  ? levETIRAcetam (KEPPRA) 750 MG tablet Take 1 tablet (750 mg total) by mouth 2 (two) times daily. 180 tablet 3  ? levonorgestrel (MIRENA) 20 MCG/24HR IUD 1 each by Intrauterine route once.    ? tiZANidine (ZANAFLEX) 4 MG tablet Take 1 tablet (4 mg total) by mouth at bedtime. 30 tablet 11  ? topiramate (TOPAMAX) 100 MG tablet Take 1 tablet (100 mg total) by mouth 2 (two) times daily. 60 tablet 11  ? citalopram (CELEXA) 10 MG tablet Take 1 tablet (10 mg total) by mouth daily. 30 tablet 2  ? ?Facility-Administered Medications Prior to Visit  ?Medication Dose Route Frequency Provider Last Rate Last Admin  ? Galcanezumab-gnlm SOAJ 240 mg  240 mg Subcutaneous Once Frann Rider, NP      ? ? ?No Known Allergies ? ?ROS ?Review of Systems  ?Constitutional:  Negative for chills and fever.  ?HENT:  Negative for congestion, sinus pressure, sinus pain and sore throat.   ?Eyes:  Negative for pain and discharge.  ?Respiratory:  Negative for cough and shortness of breath.   ?Cardiovascular:  Negative for chest pain and palpitations.  ?Gastrointestinal:  Negative for abdominal pain, diarrhea, nausea and vomiting.  ?Endocrine: Negative for polydipsia and polyuria.  ?Genitourinary:  Negative for dysuria and hematuria.  ?Musculoskeletal:  Negative for neck pain and neck stiffness.  ?Skin:  Negative for rash.  ?Neurological:  Positive for headaches. Negative for dizziness and weakness.  ?Psychiatric/Behavioral:  Positive for sleep disturbance. Negative for agitation and behavioral problems. The patient is not nervous/anxious.   ? ?  ?Objective:  ?  ?Physical Exam ?Vitals reviewed.  ?Constitutional:   ?   General: She is not in acute distress. ?   Appearance: She is obese. She is not diaphoretic.  ?HENT:  ?   Head: Normocephalic and atraumatic.  ?   Nose: Nose normal.  ?   Mouth/Throat:  ?   Mouth: Mucous membranes are moist.  ?Eyes:  ?   General: No scleral  icterus. ?   Extraocular Movements: Extraocular movements intact.  ?Cardiovascular:  ?   Rate and Rhythm: Normal rate and regular rhythm.  ?   Pulses: Normal pulses.  ?   Heart sounds: Normal heart sounds. No murmur heard. ?Pulmonary:  ?   Breath sounds: Normal breath sounds. No wheezing or rales.  ?Musculoskeletal:  ?   Cervical back: Neck supple.  No tenderness.  ?   Right lower leg: No edema.  ?   Left lower leg: No edema.  ?Skin: ?   General: Skin is warm.  ?   Findings: No rash.  ?Neurological:  ?   General: No focal deficit present.  ?   Mental Status: She is alert and oriented to person, place, and time.  ?   Sensory: No sensory deficit.  ?   Motor: No weakness.  ?Psychiatric:     ?   Mood and Affect: Mood normal.     ?   Behavior: Behavior normal.  ? ? ?BP 108/72 (BP Location: Left Arm, Patient Position: Sitting, Cuff Size: Normal)   Pulse (!) 50   Resp 18   Ht _0  (1.575 m)   Wt 246 lb 3.2 oz (111.7 kg)   SpO2 99%   BMI 45.03 kg/m?  ?Wt Readings from Last 3 Encounters:  ?07/05/21 246 lb 3.2 oz (111.7 kg)  ?06/15/21 249 lb 1.9 oz (113 kg)  ?05/15/21 248 lb (112.5 kg)  ? ? ?Lab Results  ?Component Value Date  ? TSH 2.110 04/03/2021  ? ?Lab Results  ?Component Value Date  ? WBC 11.8 (H) 06/15/2021  ? HGB 14.1 06/15/2021  ? HCT 44.3 06/15/2021  ? MCV 88.6 06/15/2021  ? PLT 313 06/15/2021  ? ?Lab Results  ?Component Value Date  ? NA 138 06/15/2021  ? K 3.5 06/15/2021  ? CO2 20 (L) 06/15/2021  ? GLUCOSE 89 06/15/2021  ? BUN 21 (H) 06/15/2021  ? CREATININE 0.88 06/15/2021  ? BILITOT 0.4 06/15/2021  ? ALKPHOS 64 06/15/2021  ? AST 14 (L) 06/15/2021  ? ALT 19 06/15/2021  ? PROT 7.0 06/15/2021  ? ALBUMIN 3.8 06/15/2021  ? CALCIUM 8.4 (L) 06/15/2021  ? ANIONGAP 6 06/15/2021  ? EGFR 85 04/03/2021  ? ?Lab Results  ?Component Value Date  ? CHOL 185 04/03/2021  ? ?Lab Results  ?Component Value Date  ? HDL 42 04/03/2021  ? ?Lab Results  ?Component Value Date  ? LDLCALC 125 (H) 04/03/2021  ? ?Lab Results  ?Component  Value Date  ? TRIG 98 04/03/2021  ? ?Lab Results  ?Component Value Date  ? CHOLHDL 4.4 04/03/2021  ? ?Lab Results  ?Component Value Date  ? HGBA1C 5.5 04/03/2021  ? ? ?  ?Assessment & Plan:  ? ?Problem List Item

## 2021-07-05 NOTE — Assessment & Plan Note (Signed)
ER chart reviewed, including imaging ?CT head was unremarkable ?Likely concussion injury from mechanical fall - symptoms resolved now ?

## 2021-07-05 NOTE — Assessment & Plan Note (Signed)
?    05/15/2021  ?  9:27 AM 08/29/2020  ?  3:16 PM  ?GAD 7 : Generalized Anxiety Score  ?Nervous, Anxious, on Edge 0 3  ?Control/stop worrying 0 3  ?Worry too much - different things 0 3  ?Trouble relaxing 0 3  ?Restless 0 3  ?Easily annoyed or irritable 0 3  ?Afraid - awful might happen 0 3  ?Total GAD 7 Score 0 21  ? ?Well-controlled with Celexa ?Has been referred to Psychiatry by her Neurologist ?

## 2021-07-05 NOTE — Assessment & Plan Note (Signed)
Has lost about 7 lbs since the last visit ?Diet modification and moderate exercise advised ?

## 2021-07-05 NOTE — Assessment & Plan Note (Signed)
On Keppra Seizure-free for the last 2 years Followed by neurology 

## 2021-07-05 NOTE — Assessment & Plan Note (Signed)
Recently started [facility], has not been able to continue it -needs to follow-up with neurology ?On Topamax ?Followed by neurology ?

## 2021-07-05 NOTE — Assessment & Plan Note (Signed)
Last vitamin D ?Lab Results  ?Component Value Date  ? VD25OH 22.2 (L) 04/03/2021  ? ?Advised to take vitamin D 5000 IU daily ?

## 2021-07-05 NOTE — Patient Instructions (Signed)
Please continue taking medications as prescribed. ? ?Please start taking Vitamin D 5000 IU once daily. ? ?Please continue to follow low carb diet and perform moderate exercise/walking at least 150 mins/week. ?

## 2021-09-24 NOTE — Progress Notes (Deleted)
[facility] [address] [place]. [place] [address] [phone]       OFFICE FOLLOW UP NOTE  [name] Date of Birth:  1985-06-11 Medical Record Number:  [id number]   Referring MD: Lilly Cove Reason for Referral: Seizure   No chief complaint on file.     HPI:   Update 09/24/2021 JM: Patient returns for 58-month follow-up regarding headaches, seizures and subjective memory loss.  Headaches:  Initiated Emgality at prior visit *** Currently on topiramate 100 mg nightly and tizanidine 4 mg nightly   Seizures: No additional seizures Compliant on Keppra 750 mg twice daily, denies side effects   Feels depression has been well controlled on citalopram.  Short-term memory ***        History provided for reference purposes only Update 03/26/2021 JM: Returns for routine follow-up after prior visit 8 months ago for headaches, seizures and memory loss concerns.  Pt eval by Dr. Frances Furbish for suspected sleep apnea in 09/2020 with completion of sleep study 12/2020 which did not show evidence of sleep apnea. Upon call for these results, she disucssed with RN headaches worsening and restarted propranolol on her own despite this previously being discontinued by cardiology due to bradycardia - recommended increasing topiramate dosage from 50mg  BID to 50mg  AM and 100mg  PM and stop propranolol per cardiology recommendations.  Tolerating current dose of topiramate with some benefit after increasing but still c/o daily tension type headaches still bitemporal and occasionally occipital. She will also have approx 15 worsening headaches per month which seem to be more migrainous type.   Has remained on keppra 750 mg twice daily without side effects and no seizure activity  Short-term memory loss improved since prior visit - has been doing compensation strategies to further help Referred to [facility] but per patient, never called - per referral review, it was denied as  [facility] not accepting new referrals. Still having issues with depression/anxiety which can trigger worsening headaches/migraines   No further concerns at this time   Update 07/18/2020 JM: Alexandra Garrett returns for sooner visit to review recent test results and continued memory concerns and headaches.  She is accompanied by her husband.  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  She is also been experiencing frequent headaches located bitemporally with constant pressure which can fluctuate throughout the day at times associated with photophobia and phonophobia.  Denies any visual changes.  At times memory can be worse with worsening headache.  She does admit to greatly increased stress levels as well as underlying untreated anxiety.  She will take Aleve frequently throughout the day without much benefit.  Reports sleeping well at night but does have daytime fatigue and husband reports snoring and witnessed apnea.  Thankfully, she has not had any additional seizures that she is aware of and has been tolerating Keppra 750 mg twice daily without side effects.  No further concerns at this time  Consult visit 06/14/2020 Dr. Carolyne Fiscal: Alexandra Garrett is a 36 year old Caucasian lady seen today for initial office consultation visit for seizures.  History is obtained from  the patient, review of electronic medical records and I personally reviewed available imaging films in PACS.  Patient has past medical history of anemia, seizures.  Her seizures began at approximately  age 53 and continued for around 10 years and stopped at age 78.  She was seen by Dr. Sharene Skeans in was on Tegretol which worked quite well.  Seizures were described as being generalized tonic-clonic with tongue bite and injury.  Tegretol was tapered off and she did well and remained seizure-free for for almost 10 years and started having nocturnal seizures from age 65.  The patient states she knows she is at a seizure because she wakes up with a bad headache and occasionally has tongue bites as well as feels muscles are sore and she is tired for the next day.  She was seen in the emergency room at [facility] where she lived for several times for the seizures.  She has moved to [place] 7 years ago and has seizures around 102/year.  She is has been seen Dr. Gerilyn Pilgrim.  He did a polysomnogram which was apparently unremarkable.  He started her on Keppra 500 mg twice daily which she is tolerating well without any side effects.  Her last seizure was 2 3 weeks ago and the EMS were called but by the time they arrived patient was recovering back to baseline and refused to go to the ER.  She states she is been compliant with her Keppra and has not missed a single dose.  She states she has been starting having headaches after seizures and at times even memory loss.  Seizures often triggered by stress and lack of sleep.  She had a Mini-Mental status exam done today and scored   26 out of 30 with deficits in orientation and attention calculation and recall.  She is worried that her memory loss after seizure is a new finding.  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  I was unable to get records from Dr. Gerilyn Pilgrim, Dr. Sharene Skeans or any prior EEG studies for my review today  ROS:   14 system review of systems is positive for those listed in HPI and all other systems negative  PMH:  Past Medical History:   Diagnosis Date   Anemia    History of medication noncompliance    Seizures ([facility])    Since age 1 yrs    Social History:  Social History   Socioeconomic History   Marital status: Married    Spouse name: Not on file   Number of children: 2   Years of education: some college   Highest education level: Not on file  Occupational History   Not on file  Tobacco Use   Smoking status: Never   Smokeless tobacco: Never  Vaping Use   Vaping Use: Never used  Substance and Sexual Activity   Alcohol use: No    Alcohol/week: 0.0 standard drinks of alcohol   Drug use: No   Sexual activity: Yes    Birth control/protection: I.U.D.  Other Topics Concern   Not on file  Social History Narrative   Right handed   Soda: 2-6 per day   Works at [employer].   Married April 2020,second marriage.Lives with husband and 2 kids.      Social Determinants of Health   Financial Resource Strain: Low Risk  (05/15/2021)   Overall Physicist, medical Strain (  CARDIA)    Difficulty of Paying Living Expenses: Not very hard  Food Insecurity: No Food Insecurity (05/15/2021)   Hunger Vital Sign    Worried About Running Out of Food in the Last Year: Never true    Ran Out of Food in the Last Year: Never true  Transportation Needs: No Transportation Needs (05/15/2021)   PRAPARE - Administrator, Civil Service (Medical): No    Lack of Transportation (Non-Medical): No  Physical Activity: Insufficiently Active (05/15/2021)   Exercise Vital Sign    Days of Exercise per Week: 5 days    Minutes of Exercise per Session: 20 min  Stress: Stress Concern Present (05/15/2021)   [employer] of Occupational Health - Occupational Stress Questionnaire    Feeling of Stress : To some extent  Social Connections: Socially Integrated (05/15/2021)   Social Connection and Isolation Panel [NHANES]    Frequency of Communication with Friends and Family: More than three times a week    Frequency of Social Gatherings with  Friends and Family: Once a week    Attends Religious Services: More than 4 times per year    Active Member of [employer] or Organizations: Yes    Attends Engineer, structural: More than 4 times per year    Marital Status: Married  Catering manager Violence: Not At Risk (05/15/2021)   Humiliation, Afraid, Rape, and Kick questionnaire    Fear of Current or Ex-Partner: No    Emotionally Abused: No    Physically Abused: No    Sexually Abused: No    Medications:   Current Outpatient Medications on File Prior to Visit  Medication Sig Dispense Refill   cetirizine (ZYRTEC) 10 MG tablet Take 1 tablet (10 mg total) by mouth daily. 30 tablet 11   Cholecalciferol (VITAMIN D3) 25 MCG (1000 UT) CAPS Take 1 capsule by mouth daily.     citalopram (CELEXA) 10 MG tablet Take 1 tablet (10 mg total) by mouth daily. 90 tablet 1   Galcanezumab-gnlm (EMGALITY) 120 MG/ML SOAJ Inject 120 mg into the skin every 30 (thirty) days. 1.12 mL 11   levETIRAcetam (KEPPRA) 750 MG tablet Take 1 tablet (750 mg total) by mouth 2 (two) times daily. 180 tablet 3   levonorgestrel (MIRENA) 20 MCG/24HR IUD 1 each by Intrauterine route once.     tiZANidine (ZANAFLEX) 4 MG tablet Take 1 tablet (4 mg total) by mouth at bedtime. 30 tablet 11   topiramate (TOPAMAX) 100 MG tablet Take 1 tablet (100 mg total) by mouth 2 (two) times daily. 60 tablet 11   Current Facility-Administered Medications on File Prior to Visit  Medication Dose Route Frequency Provider Last Rate Last Admin   Galcanezumab-gnlm SOAJ 240 mg  240 mg Subcutaneous Once Ihor Austin, NP        Allergies:  No Known Allergies  Physical Exam There were no vitals filed for this visit. There is no height or weight on file to calculate BMI.  General: Morbidly obese mildly anxious young Caucasian lady, seated, in no evident distress Head: head normocephalic and atraumatic.   Neck: supple with no carotid or supraclavicular bruits Cardiovascular: regular rate and  rhythm, no murmurs Musculoskeletal: no deformity Skin:  no rash/petichiae Vascular:  Normal pulses all extremities  Neurologic Exam Mental Status: Awake and fully alert. Oriented to place and time. Recent memory subjectively impaired and remote memory intact. Attention span, concentration and fund of knowledge appropriate during visit. Mood and affect appropriate.   Cranial Nerves:  Pupils equal, briskly reactive to light. Extraocular movements full without nystagmus. Visual fields full to confrontation. Hearing intact. Facial sensation intact. Face, tongue, palate moves normally and symmetrically.  Motor: Normal bulk and tone. Normal strength in all tested extremity muscles. Sensory.: intact to touch , pinprick , position and vibratory sensation.  Coordination: Rapid alternating movements normal in all extremities. Finger-to-nose and heel-to-shin performed accurately bilaterally. Gait and Station: Arises from chair without difficulty. Stance is normal. Gait demonstrates normal stride length and balance . Able to heel, toe and tandem walk without difficulty.  Reflexes: 1+ and symmetric. Toes downgoing.      ASSESSMENT: 36 year old Caucasian lady with with childhood history of generalized epilepsy which she had outgrown but started having nocturnal seizures around 2010.  Also complains of short-term memory concerns and frequent headaches. Sleep study 12/2020 negative for sleep apnea     PLAN:  Nocturnal seizures -Last seizure occurrence 05/2020 -Continue Keppra 750 mg twice daily for seizure prevention -refill provided -EEG unremarkable -Discussed importance of medication compliance and preventing/avoiding seizure provoking triggers -MR brain no acute abnormality; chronic T2/FLAIR hyperintense foci similar appearance from 2017 imaging likely in setting of very minimal chronic microvascular ischemic change or the sequela of migraine headaches as well as stable right posterior temporal  developmental venous anomaly   Daily headaches, chronic -per symptoms, seems to be mix of both daily tension type headaches and approx 15 migrainous type headaches per month -Preventative: Emgality injection monthly, topiramate 100 mg twice daily, and tizanidine 4 mg nightly -Contraindicated previously beta-blocker due to bradycardia and antidepressants due to seizure history -Referral placed (again) to behavioral health as increased stressors and uncontrolled anxiety/depression can trigger migrainous type headache  Short-term memory loss -stable -suspect underlying depression/anxiety largely contributing     Follow-up in 6 months or call earlier if needed    I spent 32 minutes of face-to-face and non-face-to-face time with patient.  This included previsit chart review, lab review, study review, order entry, electronic health record documentation, patient education and discussion regarding above diagnoses, further treatment options and education and answered all other questions to patient satisfaction   Ihor Austin, [facility]  [facility] [address] [place], [place] [address]  Phone [phone] Fax [phone] Note: This document was prepared with digital dictation and possible smart phrase technology. Any transcriptional errors that result from this process are unintentional.

## 2021-09-25 ENCOUNTER — Ambulatory Visit: Payer: Medicaid Other | Admitting: Adult Health

## 2021-10-17 NOTE — Progress Notes (Deleted)
[facility] [address] [place]. [place] [address] [phone]       OFFICE FOLLOW UP NOTE  Alexandra Garrett Date of Birth:  06/01/1985 Medical Record Number:  [id number]   Referring MD: Lilly Cove Reason for Referral: Seizure   No chief complaint on file.     HPI:   Update 10/22/2021 JM: Patient returns for 11-month follow-up regarding headaches, seizures and subjective memory loss.  Headaches:  Initiated Emgality at prior visit *** Currently on topiramate 100 mg nightly and tizanidine 4 mg nightly   Seizures: No additional seizures Compliant on Keppra 750 mg twice daily, denies side effects   Feels depression has been well controlled on citalopram.  Short-term memory ***        History provided for reference purposes only Update 03/26/2021 JM: Returns for routine follow-up after prior visit 8 months ago for headaches, seizures and memory loss concerns.  Pt eval by Dr. Frances Furbish for suspected sleep apnea in 09/2020 with completion of sleep study 12/2020 which did not show evidence of sleep apnea. Upon call for these results, she disucssed with RN headaches worsening and restarted propranolol on her own despite this previously being discontinued by cardiology due to bradycardia - recommended increasing topiramate dosage from 50mg  BID to 50mg  AM and 100mg  PM and stop propranolol per cardiology recommendations.  Tolerating current dose of topiramate with some benefit after increasing but still c/o daily tension type headaches still bitemporal and occasionally occipital. She will also have approx 15 worsening headaches per month which seem to be more migrainous type.   Has remained on keppra 750 mg twice daily without side effects and no seizure activity  Short-term memory loss improved since prior visit - has been doing compensation strategies to further help Referred to [facility] but per patient, never called - per referral review, it was denied as  [facility] not accepting new referrals. Still having issues with depression/anxiety which can trigger worsening headaches/migraines   No further concerns at this time   Update 07/18/2020 JM: Alexandra Garrett returns for sooner visit to review recent test results and continued memory concerns and headaches.  She is accompanied by her husband.  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  She is also been experiencing frequent headaches located bitemporally with constant pressure which can fluctuate throughout the day at times associated with photophobia and phonophobia.  Denies any visual changes.  At times memory can be worse with worsening headache.  She does admit to greatly increased stress levels as well as underlying untreated anxiety.  She will take Aleve frequently throughout the day without much benefit.  Reports sleeping well at night but does have daytime fatigue and husband reports snoring and witnessed apnea.  Thankfully, she has not had any additional seizures that she is aware of and has been tolerating Keppra 750 mg twice daily without side effects.  No further concerns at this time  Consult visit 06/14/2020 Dr. Carolyne Fiscal: Alexandra Garrett is a 36 year old Caucasian lady seen today for initial office consultation visit for seizures.  History is obtained from  the patient, review of electronic medical records and I personally reviewed available imaging films in PACS.  Patient has past medical history of anemia, seizures.  Her seizures began at approximately  age 53 and continued for around 10 years and stopped at age 78.  She was seen by Dr. Sharene Skeans in was on Tegretol which worked quite well.  Seizures were described as being generalized tonic-clonic with tongue bite and injury.  Tegretol was tapered off and she did well and remained seizure-free for for almost 10 years and started having nocturnal seizures from age 65.  The patient states she knows she is at a seizure because she wakes up with a bad headache and occasionally has tongue bites as well as feels muscles are sore and she is tired for the next day.  She was seen in the emergency room at [facility] where she lived for several times for the seizures.  She has moved to [place] 7 years ago and has seizures around 102/year.  She is has been seen Dr. Gerilyn Pilgrim.  He did a polysomnogram which was apparently unremarkable.  He started her on Keppra 500 mg twice daily which she is tolerating well without any side effects.  Her last seizure was 2 3 weeks ago and the EMS were called but by the time they arrived patient was recovering back to baseline and refused to go to the ER.  She states she is been compliant with her Keppra and has not missed a single dose.  She states she has been starting having headaches after seizures and at times even memory loss.  Seizures often triggered by stress and lack of sleep.  She had a Mini-Mental status exam done today and scored   26 out of 30 with deficits in orientation and attention calculation and recall.  She is worried that her memory loss after seizure is a new finding.  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  I was unable to get records from Dr. Gerilyn Pilgrim, Dr. Sharene Skeans or any prior EEG studies for my review today  ROS:   14 system review of systems is positive for those listed in HPI and all other systems negative  PMH:  Past Medical History:   Diagnosis Date   Anemia    History of medication noncompliance    Seizures (HCC)    Since age 1 yrs    Social History:  Social History   Socioeconomic History   Marital status: Married    Spouse name: Not on file   Number of children: 2   Years of education: some college   Highest education level: Not on file  Occupational History   Not on file  Tobacco Use   Smoking status: Never   Smokeless tobacco: Never  Vaping Use   Vaping Use: Never used  Substance and Sexual Activity   Alcohol use: No    Alcohol/week: 0.0 standard drinks of alcohol   Drug use: No   Sexual activity: Yes    Birth control/protection: I.U.D.  Other Topics Concern   Not on file  Social History Narrative   Right handed   Soda: 2-6 per day   Works at [employer].   Married April 2020,second marriage.Lives with husband and 2 kids.      Social Determinants of Health   Financial Resource Strain: Low Risk  (05/15/2021)   Overall Physicist, medical Strain (  CARDIA)    Difficulty of Paying Living Expenses: Not very hard  Food Insecurity: No Food Insecurity (05/15/2021)   Hunger Vital Sign    Worried About Running Out of Food in the Last Year: Never true    Ran Out of Food in the Last Year: Never true  Transportation Needs: No Transportation Needs (05/15/2021)   PRAPARE - Administrator, Civil Service (Medical): No    Lack of Transportation (Non-Medical): No  Physical Activity: Insufficiently Active (05/15/2021)   Exercise Vital Sign    Days of Exercise per Week: 5 days    Minutes of Exercise per Session: 20 min  Stress: Stress Concern Present (05/15/2021)   [employer] of Occupational Health - Occupational Stress Questionnaire    Feeling of Stress : To some extent  Social Connections: Socially Integrated (05/15/2021)   Social Connection and Isolation Panel [NHANES]    Frequency of Communication with Friends and Family: More than three times a week    Frequency of Social Gatherings with  Friends and Family: Once a week    Attends Religious Services: More than 4 times per year    Active Member of [employer] or Organizations: Yes    Attends Engineer, structural: More than 4 times per year    Marital Status: Married  Catering manager Violence: Not At Risk (05/15/2021)   Humiliation, Afraid, Rape, and Kick questionnaire    Fear of Current or Ex-Partner: No    Emotionally Abused: No    Physically Abused: No    Sexually Abused: No    Medications:   Current Outpatient Medications on File Prior to Visit  Medication Sig Dispense Refill   cetirizine (ZYRTEC) 10 MG tablet Take 1 tablet (10 mg total) by mouth daily. 30 tablet 11   Cholecalciferol (VITAMIN D3) 25 MCG (1000 UT) CAPS Take 1 capsule by mouth daily.     citalopram (CELEXA) 10 MG tablet Take 1 tablet (10 mg total) by mouth daily. 90 tablet 1   Galcanezumab-gnlm (EMGALITY) 120 MG/ML SOAJ Inject 120 mg into the skin every 30 (thirty) days. 1.12 mL 11   levETIRAcetam (KEPPRA) 750 MG tablet Take 1 tablet (750 mg total) by mouth 2 (two) times daily. 180 tablet 3   levonorgestrel (MIRENA) 20 MCG/24HR IUD 1 each by Intrauterine route once.     tiZANidine (ZANAFLEX) 4 MG tablet Take 1 tablet (4 mg total) by mouth at bedtime. 30 tablet 11   topiramate (TOPAMAX) 100 MG tablet Take 1 tablet (100 mg total) by mouth 2 (two) times daily. 60 tablet 11   Current Facility-Administered Medications on File Prior to Visit  Medication Dose Route Frequency Provider Last Rate Last Admin   Galcanezumab-gnlm SOAJ 240 mg  240 mg Subcutaneous Once Ihor Austin, NP        Allergies:  No Known Allergies  Physical Exam There were no vitals filed for this visit. There is no height or weight on file to calculate BMI.  General: Morbidly obese mildly anxious young Caucasian lady, seated, in no evident distress Head: head normocephalic and atraumatic.   Neck: supple with no carotid or supraclavicular bruits Cardiovascular: regular rate and  rhythm, no murmurs Musculoskeletal: no deformity Skin:  no rash/petichiae Vascular:  Normal pulses all extremities  Neurologic Exam Mental Status: Awake and fully alert. Oriented to place and time. Recent memory subjectively impaired and remote memory intact. Attention span, concentration and fund of knowledge appropriate during visit. Mood and affect appropriate.   Cranial Nerves:  Pupils equal, briskly reactive to light. Extraocular movements full without nystagmus. Visual fields full to confrontation. Hearing intact. Facial sensation intact. Face, tongue, palate moves normally and symmetrically.  Motor: Normal bulk and tone. Normal strength in all tested extremity muscles. Sensory.: intact to touch , pinprick , position and vibratory sensation.  Coordination: Rapid alternating movements normal in all extremities. Finger-to-nose and heel-to-shin performed accurately bilaterally. Gait and Station: Arises from chair without difficulty. Stance is normal. Gait demonstrates normal stride length and balance . Able to heel, toe and tandem walk without difficulty.  Reflexes: 1+ and symmetric. Toes downgoing.      ASSESSMENT: 36 year old Caucasian lady with with childhood history of generalized epilepsy which she had outgrown but started having nocturnal seizures around 2010.  Also complains of short-term memory concerns and frequent headaches. Sleep study 12/2020 negative for sleep apnea     PLAN:  Nocturnal seizures -Last seizure occurrence 05/2020 -Continue Keppra 750 mg twice daily for seizure prevention -refill provided -EEG unremarkable -Discussed importance of medication compliance and preventing/avoiding seizure provoking triggers -MR brain no acute abnormality; chronic T2/FLAIR hyperintense foci similar appearance from 2017 imaging likely in setting of very minimal chronic microvascular ischemic change or the sequela of migraine headaches as well as stable right posterior temporal  developmental venous anomaly   Daily headaches, chronic -per symptoms, seems to be mix of both daily tension type headaches and approx 15 migrainous type headaches per month -Preventative: Emgality injection monthly, topiramate 100 mg twice daily, and tizanidine 4 mg nightly -Contraindicated previously beta-blocker due to bradycardia and antidepressants due to seizure history -Referral placed (again) to behavioral health as increased stressors and uncontrolled anxiety/depression can trigger migrainous type headache  Short-term memory loss -stable -suspect underlying depression/anxiety largely contributing     Follow-up in 6 months or call earlier if needed    I spent 32 minutes of face-to-face and non-face-to-face time with patient.  This included previsit chart review, lab review, study review, order entry, electronic health record documentation, patient education and discussion regarding above diagnoses, further treatment options and education and answered all other questions to patient satisfaction   Ihor Austin, [facility]  [facility] [address] [place], [place] [address]  Phone [phone] Fax [phone] Note: This document was prepared with digital dictation and possible smart phrase technology. Any transcriptional errors that result from this process are unintentional.

## 2021-10-22 ENCOUNTER — Ambulatory Visit: Payer: Medicaid Other | Admitting: Adult Health

## 2021-12-25 NOTE — Progress Notes (Unsigned)
[facility] [address] [place]. [place] [address] [phone]       OFFICE FOLLOW UP NOTE  Ms. JAIMY KLIETHERMES Date of Birth:  05-Jul-1985 Medical Record Number:  [id number]   Referring MD: Hurshel Party Reason for Referral: Seizure, headaches, memory loss   Chief Complaint  Patient presents with   Migraine    Rm 3, 6 month FU, husband- Jeneen Rinks "Emgality not approved, never got tizanidine, PCP took me off propranolol in April and have been having bad migraines again, I want refill, taking Aleve and Excedrin migraine which works well"    Seizures      HPI:   Update 12/26/2021 JM: Patient returns for follow-up visit regarding history of migraine headaches and seizures.  She is accompanied by her husband.  She reports continued mixed migrainous and tension type headaches.  She has been experiencing almost daily tension type headaches and about 16 migrainous days per month.  She did not start Emgality as previously ordered, reports her pharmacy told her insurance did not approve but no information about this in epic.  She reports PCP discontinued propranolol back in April and has since been experiencing worsening headaches but this had been previously discontinued by cardiology due to bradycardia which was fully discussed at prior visit as well.  She has remained on topiramate 100 mg twice daily, tolerating without side effects. She did not trial use of tizanidine for severe headaches/migraines as she was not aware of this prescription.  She can have nausea with severe migraines.  Remains on Keppra 750 mg twice daily, reports typical seizure back in March, does admit to significantly increased stress at that time.  Denies missing any medication dosage.  She has not had any additional events since that time.  She questions Keppra XR formulation due to ease of only taking once daily.  She continues to experience memory loss but this has been stable since prior  visit.  She believes her depression/anxiety are under fairly good control on citalopram, she can have occasional increased anxiety at times.  This is currently being managed by her PCP.      History provided for reference purposes only Update 03/26/2021 JM: Returns for routine follow-up after prior visit 8 months ago for headaches, seizures and memory loss concerns.  Pt eval by Dr. Rexene Alberts for suspected sleep apnea in 09/2020 with completion of sleep study 12/2020 which did not show evidence of sleep apnea. Upon call for these results, she disucssed with RN headaches worsening and restarted propranolol on her own despite this previously being discontinued by cardiology due to bradycardia - recommended increasing topiramate dosage from 50mg  BID to 50mg  AM and 100mg  PM and stop propranolol per cardiology recommendations.  Tolerating current dose of topiramate with some benefit after increasing but still c/o daily tension type headaches still bitemporal and occasionally occipital. She will also have approx 15 worsening headaches per month which seem to be more migrainous type.   Has remained on keppra 750 mg twice daily without side effects and no seizure activity  Short-term memory loss improved since prior visit - has been doing compensation strategies to further help Referred to [facility] but per patient, never called - per referral review, it was denied as [facility] not accepting new referrals. Still having issues with depression/anxiety which can trigger worsening headaches/migraines   No further concerns at this time  Update 07/18/2020 JM: Mrs. Lover returns for sooner visit to review recent test results and continued memory concerns and  headaches.  She is accompanied by her husband.  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  She is also been experiencing frequent headaches located bitemporally with constant pressure which can fluctuate throughout the day at times associated with photophobia and phonophobia.  Denies any visual changes.  At times memory can be worse with worsening headache.  She does admit to greatly increased stress levels as well as underlying untreated anxiety.  She will take Aleve frequently throughout the day without much benefit.  Reports sleeping well at night but does have daytime fatigue and husband reports snoring and witnessed apnea.  Thankfully, she has not had any additional seizures that she is aware of and has been tolerating Keppra 750 mg twice daily without side effects.  No further concerns at this time  Consult visit 06/14/2020 Dr. Pearlean BrownieSethi: Ms. Carolyne Fiscalnman is a 36 year old Caucasian lady seen today for initial office consultation visit for seizures.  History is obtained from the patient, review of electronic medical records and I personally reviewed available imaging films in PACS.  Patient has past medical history of anemia, seizures.  Her seizures began at approximately age 89 and continued for around 10 years and stopped at age 36.  She was seen by Dr. Sharene SkeansHickling in was on Tegretol which worked quite well.  Seizures were described as being generalized tonic-clonic with tongue bite and injury.  Tegretol was tapered off and she did well and remained seizure-free for for almost 10 years and started having nocturnal seizures from age 36.  The patient states she knows she is at a seizure because she wakes up with a bad headache and occasionally has tongue bites as well as feels muscles are sore and she is tired for the next day.  [facility] times for the seizures.  She has moved to [place] 7 years ago and has seizures around 102/year.  She is has been seen Dr. Gerilyn Pilgrimoonquah.  He did a polysomnogram which was apparently unremarkable.  He started her on Keppra 500 mg twice daily which she is tolerating well without any side effects.  Her last seizure was 2 3 weeks ago and the EMS were called but by the time they arrived patient was recovering back to baseline and refused to go to the ER.  She states she is been compliant with her Keppra and has not missed a single dose.  She states she has been starting having headaches after seizures and at times even memory loss.  Seizures often triggered by stress and lack of sleep.  She had a Mini-Mental status exam done today and scored   26 out of 30 with deficits in orientation and attention calculation and recall.  She is worried that her memory loss after seizure is a new finding.  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  I was unable to get records from Dr. Gerilyn Pilgrimoonquah, Dr. Sharene SkeansHickling or any  prior EEG studies for my review today    ROS:   14 system review of systems is positive for those listed in HPI and all other systems negative   PMH:  Past Medical History:  Diagnosis Date   Anemia    Headache    History of medication noncompliance    Seizures (HCC)    Since age 64 yrs    Social History:  Social History   Socioeconomic History   Marital status: Married    Spouse name: Fayrene Fearing   Number of children: 2   Years of education: some college   Highest education level: Not on file  Occupational History   Not on file  Tobacco Use   Smoking status: Never   Smokeless tobacco: Never  Vaping Use   Vaping Use: Never used  Substance and Sexual Activity   Alcohol use: No    Alcohol/week: 0.0 standard  drinks of alcohol   Drug use: No   Sexual activity: Yes    Birth control/protection: I.U.D.  Other Topics Concern   Not on file  Social History Narrative   Right handed   Soda: 2-6 per day   Works at [employer].   Married April 2020,second marriage.Lives with husband and 2 kids.      Social Determinants of Health   Financial Resource Strain: Low Risk  (05/15/2021)   Overall Financial Resource Strain (CARDIA)    Difficulty of Paying Living Expenses: Not very hard  Food Insecurity: No Food Insecurity (05/15/2021)   Hunger Vital Sign    Worried About Running Out of Food in the Last Year: Never true    Ran Out of Food in the Last Year: Never true  Transportation Needs: No Transportation Needs (05/15/2021)   PRAPARE - Administrator, Civil Service (Medical): No    Lack of Transportation (Non-Medical): No  Physical Activity: Insufficiently Active (05/15/2021)   Exercise Vital Sign    Days of Exercise per Week: 5 days    Minutes of Exercise per Session: 20 min  Stress: Stress Concern Present (05/15/2021)   [employer] of Occupational Health - Occupational Stress Questionnaire    Feeling of Stress : To some extent  Social Connections: Socially Integrated (05/15/2021)   Social Connection and Isolation Panel [NHANES]    Frequency of Communication with Friends and Family: More than three times a week    Frequency of Social Gatherings with Friends and Family: Once a week    Attends Religious Services: More than 4 times per year    Active Member of [employer] or Organizations: Yes    Attends Engineer, structural: More than 4 times per year    Marital Status: Married  Catering manager Violence: Not At Risk (05/15/2021)   Humiliation, Afraid, Rape, and Kick questionnaire    Fear of Current or Ex-Partner: No    Emotionally Abused: No    Physically Abused: No    Sexually Abused: No    Medications:   Current Outpatient Medications on File Prior to Visit  Medication Sig  Dispense Refill   Cholecalciferol (VITAMIN D3) 25 MCG (1000 UT) CAPS Take 1 capsule by mouth daily.     citalopram (CELEXA) 10 MG tablet Take 1 tablet (10 mg total) by mouth daily. 90 tablet 1   levETIRAcetam (KEPPRA) 750 MG tablet Take 1 tablet (750 mg total) by mouth 2 (two) times daily. 180 tablet 3   levonorgestrel (MIRENA) 20 MCG/24HR IUD 1 each by Intrauterine route once.  topiramate (TOPAMAX) 100 MG tablet Take 1 tablet (100 mg total) by mouth 2 (two) times daily. 60 tablet 11   Galcanezumab-gnlm (EMGALITY) 120 MG/ML SOAJ Inject 120 mg into the skin every 30 (thirty) days. (Patient not taking: Reported on 12/26/2021) 1.12 mL 11   tiZANidine (ZANAFLEX) 4 MG tablet Take 1 tablet (4 mg total) by mouth at bedtime. (Patient not taking: Reported on 12/26/2021) 30 tablet 11   Current Facility-Administered Medications on File Prior to Visit  Medication Dose Route Frequency Provider Last Rate Last Admin   Galcanezumab-gnlm SOAJ 240 mg  240 mg Subcutaneous Once Ihor Austin, NP        Allergies:  No Known Allergies  Physical Exam Today's Vitals   12/26/21 1048  BP: 118/79  Pulse: (!) 43  Weight: 243 lb (110.2 kg)  Height: 5\' 2"  (1.575 m)   Body mass index is 44.45 kg/m.  General: Morbidly obese mildly anxious young Caucasian lady, seated, in no evident distress Head: head normocephalic and atraumatic.   Neck: supple with no carotid or supraclavicular bruits Cardiovascular: regular rate and rhythm, no murmurs Musculoskeletal: no deformity Skin:  no rash/petichiae Vascular:  Normal pulses all extremities  Neurologic Exam Mental Status: Awake and fully alert. Oriented to place and time. Recent memory subjectively impaired and remote memory intact. Attention span, concentration and fund of knowledge appropriate during visit. Mood and affect appropriate.   Cranial Nerves: Pupils equal, briskly reactive to light. Extraocular movements full without nystagmus. Visual fields full to  confrontation. Hearing intact. Facial sensation intact. Face, tongue, palate moves normally and symmetrically.  Motor: Normal bulk and tone. Normal strength in all tested extremity muscles. Sensory.: intact to touch , pinprick , position and vibratory sensation.  Coordination: Rapid alternating movements normal in all extremities. Finger-to-nose and heel-to-shin performed accurately bilaterally. Gait and Station: Arises from chair without difficulty. Stance is normal. Gait demonstrates normal stride length and balance . Able to heel, toe and tandem walk without difficulty.  Reflexes: 1+ and symmetric. Toes downgoing.        ASSESSMENT: 36 year old Caucasian lady with with childhood history of generalized epilepsy which she had outgrown but started having nocturnal seizures around 2010.  Also complains of short-term memory concerns and frequent headaches. Sleep study 12/2020 negative for sleep apnea     PLAN:  Nocturnal seizures -Reports typical seizure in 05/2021 -Possibly provoked in setting of extreme stress -As no additional seizures since that time, recommend continued current dose of Keppra but will change to XR formulation 1500 mg nightly per patient request -She was advised to call office with any additional seizure activity.  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  If no benefit  with use of Ajovy, would recommend initiating Botox -Continue topiramate currently on 100 mg twice daily-will switch to Trokendi 200 mg nightly for ease of medication -Start tizanidine 4 mg nightly for severe migraine headache -Use of Zofran as needed for nausea associated with severe migraine headache  -Again discussed importance of avoiding  propranolol due to bradycardia, HR today 43.  Would not recommend further antidepressant medications for preventative therapy as she is currently on citalopram and history of seizure disorder   Short-term memory loss -No clear cause, suspect multifactoral -stable although persistent -Discussed importance of depression/anxiety management as well as stress management -Hopefully will see some improvement once headaches under good control    Follow-up in 5 months or call earlier if needed   I spent 34 minutes of face-to-face and non-face-to-face time with patient and husband.  This included previsit chart review, lab review, study review, order entry, electronic health record documentation, patient education and discussion regarding above diagnoses, further treatment options and education and answered all other questions to patient and husband's satisfaction   Ihor Austin, [facility]  [facility] [address] [place], [place] [address]  Phone [phone] Fax [phone] Note: This document was prepared with digital dictation and possible smart phrase technology. Any transcriptional errors that result from this process are unintentional.

## 2021-12-26 ENCOUNTER — Encounter: Payer: Self-pay | Admitting: Adult Health

## 2021-12-26 ENCOUNTER — Telehealth: Payer: Self-pay | Admitting: *Deleted

## 2021-12-26 ENCOUNTER — Ambulatory Visit: Payer: Medicaid Other | Admitting: Adult Health

## 2021-12-26 VITALS — BP 118/79 | HR 43 | Ht 62.0 in | Wt 243.0 lb

## 2021-12-26 DIAGNOSIS — G43709 Chronic migraine without aura, not intractable, without status migrainosus: Secondary | ICD-10-CM

## 2021-12-26 DIAGNOSIS — R569 Unspecified convulsions: Secondary | ICD-10-CM

## 2021-12-26 DIAGNOSIS — Z5181 Encounter for therapeutic drug level monitoring: Secondary | ICD-10-CM

## 2021-12-26 DIAGNOSIS — G44209 Tension-type headache, unspecified, not intractable: Secondary | ICD-10-CM

## 2021-12-26 DIAGNOSIS — R413 Other amnesia: Secondary | ICD-10-CM | POA: Diagnosis not present

## 2021-12-26 MED ORDER — TOPIRAMATE 100 MG PO TABS: 100.0000 mg | ORAL_TABLET | Freq: Two times a day (BID) | ORAL | 3 refills | Status: DC

## 2021-12-26 MED ORDER — TOPIRAMATE ER 200 MG PO CAP24: 200.0000 mg | ORAL_CAPSULE | Freq: Every evening | ORAL | 11 refills | Status: DC

## 2021-12-26 MED ORDER — AJOVY 225 MG/1.5ML ~~LOC~~ SOAJ: 225.0000 mg | SUBCUTANEOUS | 11 refills | Status: DC

## 2021-12-26 MED ORDER — LEVETIRACETAM 750 MG PO TABS: 750.0000 mg | ORAL_TABLET | Freq: Two times a day (BID) | ORAL | 3 refills | Status: DC

## 2021-12-26 MED ORDER — ONDANSETRON 4 MG PO TBDP
4.0000 mg | ORAL_TABLET | Freq: Three times a day (TID) | ORAL | 11 refills | Status: DC | PRN
Start: 2021-12-26 — End: 2023-12-02

## 2021-12-26 MED ORDER — LEVETIRACETAM ER 750 MG PO TB24: 1500.0000 mg | ORAL_TABLET | Freq: Every day | ORAL | 11 refills | Status: DC

## 2021-12-26 MED ORDER — TIZANIDINE HCL 4 MG PO TABS
4.0000 mg | ORAL_TABLET | Freq: Every day | ORAL | 11 refills | Status: DC
Start: 2021-12-26 — End: 2022-12-02

## 2021-12-26 NOTE — Telephone Encounter (Addendum)
Ajovy PA< key [id number], [id number]. Called patient, advised her that for the prior auth for Ajovy she needs a negative pregnancy test. Order has been placed. She can get it done at [facility] in [place]. Left # for questions.

## 2021-12-26 NOTE — Patient Instructions (Addendum)
Your Plan:  Start Ajovy monthly injections for headaches  Continue topiramate but will change to extended release formulation  - you will start taking 200mg  nightly - a new prescription was sent to your pharmacy   Use of tizanidine as needed for severe headache  Use of Zofran as needed for nausea associated with headache  Continue keppra but will change to extended release formulation - you will take a total of 1500mg  (2x 750mg  XR tablets) at bedtime for seizure prevention      Follow up in 5 months or call earlier if needed     Thank you for coming to see [place] at [facility]. I hope we have been able to provide you high quality care today.  You may receive a patient satisfaction survey over the next few weeks. We would appreciate your feedback and comments so that we may continue to improve ourselves and the health of our patients.

## 2021-12-30 NOTE — Telephone Encounter (Signed)
Thank you for placing order for pregnancy test. Will wait until this is completed prior to completion of PA. Thank you.

## 2022-01-01 ENCOUNTER — Encounter: Payer: Self-pay | Admitting: Adult Health

## 2022-01-01 ENCOUNTER — Encounter: Payer: Self-pay | Admitting: *Deleted

## 2022-01-01 MED ORDER — TOPIRAMATE 100 MG PO TABS: 100.0000 mg | ORAL_TABLET | Freq: Two times a day (BID) | ORAL | 11 refills | Status: DC

## 2022-01-02 ENCOUNTER — Other Ambulatory Visit: Payer: Self-pay | Admitting: Internal Medicine

## 2022-01-02 DIAGNOSIS — F411 Generalized anxiety disorder: Secondary | ICD-10-CM

## 2022-01-06 ENCOUNTER — Ambulatory Visit: Payer: Medicaid Other | Admitting: Internal Medicine

## 2022-01-08 ENCOUNTER — Encounter: Payer: Self-pay | Admitting: Internal Medicine

## 2022-01-08 ENCOUNTER — Ambulatory Visit (INDEPENDENT_AMBULATORY_CARE_PROVIDER_SITE_OTHER): Payer: Medicaid Other | Admitting: Internal Medicine

## 2022-01-08 ENCOUNTER — Encounter: Payer: Self-pay | Admitting: *Deleted

## 2022-01-08 VITALS — BP 122/84 | HR 51 | Resp 18 | Ht 62.0 in | Wt 241.8 lb

## 2022-01-08 DIAGNOSIS — R569 Unspecified convulsions: Secondary | ICD-10-CM

## 2022-01-08 DIAGNOSIS — F411 Generalized anxiety disorder: Secondary | ICD-10-CM | POA: Diagnosis not present

## 2022-01-08 DIAGNOSIS — N926 Irregular menstruation, unspecified: Secondary | ICD-10-CM

## 2022-01-08 DIAGNOSIS — G43719 Chronic migraine without aura, intractable, without status migrainosus: Secondary | ICD-10-CM

## 2022-01-08 LAB — POCT URINE PREGNANCY: Preg Test, Ur: NEGATIVE

## 2022-01-08 MED ORDER — HYDROXYZINE PAMOATE 25 MG PO CAPS: 25.0000 mg | ORAL_CAPSULE | Freq: Three times a day (TID) | ORAL | 1 refills | Status: DC | PRN

## 2022-01-08 NOTE — Telephone Encounter (Signed)
Called patient, reminded her again to get pregnancy test done for Ajovy PA.

## 2022-01-08 NOTE — Telephone Encounter (Signed)
Patient had negative pregnancy test today. Ajovy immediately approved. PA Case: [id number], Status: Approved, Coverage Starts on: 01/08/2022 12:00:00 AM, Coverage Ends on: 04/08/2022 12:00:00 AM Sent pt my chart.

## 2022-01-08 NOTE — Patient Instructions (Signed)
Please continue taking medications as prescribed.  Please take Hydroxyzine as needed for spells of anxiety.  Please continue to follow low carb diet and perform moderate exercise/walking at least 150 mins/week.

## 2022-01-09 LAB — CMP14+EGFR
ALT: 11 IU/L (ref 0–32)
AST: 15 IU/L (ref 0–40)
Albumin/Globulin Ratio: 1.3 (ref 1.2–2.2)
Albumin: 4.1 g/dL (ref 3.9–4.9)
Alkaline Phosphatase: 92 IU/L (ref 44–121)
BUN/Creatinine Ratio: 11 (ref 9–23)
BUN: 9 mg/dL (ref 6–20)
Bilirubin Total: 1 mg/dL (ref 0.0–1.2)
CO2: 22 mmol/L (ref 20–29)
Calcium: 9.4 mg/dL (ref 8.7–10.2)
Chloride: 102 mmol/L (ref 96–106)
Creatinine, Ser: 0.84 mg/dL (ref 0.57–1.00)
Globulin, Total: 3.1 g/dL (ref 1.5–4.5)
Glucose: 81 mg/dL (ref 70–99)
Potassium: 4.2 mmol/L (ref 3.5–5.2)
Sodium: 141 mmol/L (ref 134–144)
Total Protein: 7.2 g/dL (ref 6.0–8.5)
eGFR: 92 mL/min/{1.73_m2} (ref >59)

## 2022-01-09 LAB — CBC WITH DIFFERENTIAL/PLATELET
Basophils Absolute: 0.1 10*3/uL (ref 0.0–0.2)
Basos: 1 %
EOS (ABSOLUTE): 0.1 10*3/uL (ref 0.0–0.4)
Eos: 2 %
Hematocrit: 41.5 % (ref 34.0–46.6)
Hemoglobin: 13.8 g/dL (ref 11.1–15.9)
Immature Grans (Abs): 0 10*3/uL (ref 0.0–0.1)
Immature Granulocytes: 0 %
Lymphocytes Absolute: 3.1 10*3/uL (ref 0.7–3.1)
Lymphs: 34 %
MCH: 28.7 pg (ref 26.6–33.0)
MCHC: 33.3 g/dL (ref 31.5–35.7)
MCV: 86 fL (ref 79–97)
Monocytes Absolute: 0.7 10*3/uL (ref 0.1–0.9)
Monocytes: 8 %
Neutrophils Absolute: 5 10*3/uL (ref 1.4–7.0)
Neutrophils: 55 %
Platelets: 351 10*3/uL (ref 150–450)
RBC: 4.81 x10E6/uL (ref 3.77–5.28)
RDW: 12.7 % (ref 11.7–15.4)
WBC: 9 10*3/uL (ref 3.4–10.8)

## 2022-01-09 NOTE — Progress Notes (Signed)
Established Patient Office Visit  Subjective:  Patient ID: Alexandra Garrett, female    DOB: Jul 01, 1985  Age: 36 y.o. MRN: [id number]  CC:  Chief Complaint  Patient presents with   Follow-up    Follow up migraines and GAD     HPI Alexandra Garrett is a 36 y.o. female with past medical history of seizure disorder, migraine and GAD who presents for f/u of her chronic medical conditions.  She follows up with neurology for history of seizure disorder and migraine.  She is on Keppra for seizures.  She has not had any seizures for the last 2 years.  She was recently started on Ajovy for migraine. She is also on Topamax for it.   She needs UPT for prior authorization for Ajovy.  Her anxiety is well controlled with Celexa now.  She denies any episode of panic, but has spells of stress/anxiety at times. Denies any anhedonia, SI or HI.   Past Medical History:  Diagnosis Date   Anemia    Headache    History of medication noncompliance    Seizures ([place])    Since age 38 yrs    Past Surgical History:  Procedure Laterality Date   NO PAST SURGERIES      Family History  Problem Relation Age of Onset   Arthritis Mother    Depression Mother    Hypertension Mother    Breast cancer Mother        lymph nodes   Heart disease Father    Arthritis Maternal Grandmother    Cancer Maternal Grandmother        breast   Diabetes Maternal Grandmother    Cancer Maternal Grandfather        lung   Stroke Paternal Grandmother    Heart disease Paternal Grandfather     Social History   Socioeconomic History   Marital status: Married    Spouse name: Jeneen Rinks   Number of children: 2   Years of education: some college   Highest education level: Not on file  Occupational History   Not on file  Tobacco Use   Smoking status: Never   Smokeless tobacco: Never  Vaping Use   Vaping Use: Never used  Substance and Sexual Activity   Alcohol use: No    Alcohol/week: 0.0 standard drinks of alcohol    Drug use: No   Sexual activity: Yes    Birth control/protection: I.U.D.  Other Topics Concern   Not on file  Social History Narrative   Right handed   Soda: 2-6 per day   Works at [employer].   Married April 2020,second marriage.Lives with husband and 2 kids.      Social Determinants of Health   Financial Resource Strain: Low Risk  (05/15/2021)   Overall Financial Resource Strain (CARDIA)    Difficulty of Paying Living Expenses: Not very hard  Food Insecurity: No Food Insecurity (05/15/2021)   Hunger Vital Sign    Worried About Running Out of Food in the Last Year: Never true    Ran Out of Food in the Last Year: Never true  Transportation Needs: No Transportation Needs (05/15/2021)   PRAPARE - Hydrologist (Medical): No    Lack of Transportation (Non-Medical): No  Physical Activity: Insufficiently Active (05/15/2021)   Exercise Vital Sign    Days of Exercise per Week: 5 days    Minutes of Exercise per Session: 20 min  Stress: Stress Concern Present (05/15/2021)  [place] Questionnaire    Feeling of Stress : To some extent  Social Connections: Socially Integrated (05/15/2021)   Social Connection and Isolation Panel [NHANES]    Frequency of Communication with Friends and Family: More than three times a week    Frequency of Social Gatherings with Friends and Family: Once a week    Attends Religious Services: More than 4 times per year    Active Member of [employer] or Organizations: Yes    Attends Music therapist: More than 4 times per year    Marital Status: Married  Human resources officer Violence: Not At Risk (05/15/2021)   Humiliation, Afraid, Rape, and Kick questionnaire    Fear of Current or Ex-Partner: No    Emotionally Abused: No    Physically Abused: No    Sexually Abused: No    Outpatient Medications Prior to Visit  Medication Sig Dispense Refill   Cholecalciferol (VITAMIN D3) 25 MCG  (1000 UT) CAPS Take 1 capsule by mouth daily.     citalopram (CELEXA) 10 MG tablet TAKE 1 TABLET(10 MG) BY MOUTH DAILY 90 tablet 1   Fremanezumab-vfrm (AJOVY) 225 MG/1.5ML SOAJ Inject 225 mg into the skin every 30 (thirty) days. 1.68 mL 11   levETIRAcetam (KEPPRA XR) 750 MG 24 hr tablet Take 2 tablets (1,500 mg total) by mouth daily. 60 tablet 11   levonorgestrel (MIRENA) 20 MCG/24HR IUD 1 each by Intrauterine route once.     ondansetron (ZOFRAN-ODT) 4 MG disintegrating tablet Take 1 tablet (4 mg total) by mouth every 8 (eight) hours as needed for nausea or vomiting. 20 tablet 11   tiZANidine (ZANAFLEX) 4 MG tablet Take 1 tablet (4 mg total) by mouth at bedtime. 15 tablet 11   topiramate (TOPAMAX) 100 MG tablet Take 1 tablet (100 mg total) by mouth 2 (two) times daily. 60 tablet 11   No facility-administered medications prior to visit.    No Known Allergies  ROS Review of Systems  Constitutional:  Negative for chills and fever.  HENT:  Negative for congestion, sinus pressure, sinus pain and sore throat.   Eyes:  Negative for pain and discharge.  Respiratory:  Negative for cough and shortness of breath.   Cardiovascular:  Negative for chest pain and palpitations.  Gastrointestinal:  Negative for abdominal pain, diarrhea, nausea and vomiting.  Endocrine: Negative for polydipsia and polyuria.  Genitourinary:  Negative for dysuria and hematuria.  Musculoskeletal:  Negative for neck pain and neck stiffness.  Skin:  Negative for rash.  Neurological:  Positive for headaches. Negative for dizziness and weakness.  Psychiatric/Behavioral:  Positive for sleep disturbance. Negative for agitation and behavioral problems. The patient is not nervous/anxious.       Objective:    Physical Exam Vitals reviewed.  Constitutional:      General: She is not in acute distress.    Appearance: She is obese. She is not diaphoretic.  HENT:     Head: Normocephalic and atraumatic.     Nose: Nose normal.      Mouth/Throat:     Mouth: Mucous membranes are moist.  Eyes:     General: No scleral icterus.    Extraocular Movements: Extraocular movements intact.  Cardiovascular:     Rate and Rhythm: Normal rate and regular rhythm.     Pulses: Normal pulses.     Heart sounds: Normal heart sounds. No murmur heard. Pulmonary:     Breath sounds: Normal breath sounds. No wheezing or  rales.  Musculoskeletal:     Cervical back: Neck supple. No tenderness.     Right lower leg: No edema.     Left lower leg: No edema.  Skin:    General: Skin is warm.     Findings: No rash.  Neurological:     General: No focal deficit present.     Mental Status: She is alert and oriented to person, place, and time.     Sensory: No sensory deficit.     Motor: No weakness.  Psychiatric:        Mood and Affect: Mood normal.        Behavior: Behavior normal.     BP 122/84 (BP Location: Right Arm, Patient Position: Sitting, Cuff Size: Normal)   Pulse (!) 51   Resp 18   Ht _0  (1.575 m)   Wt 241 lb 12.8 oz (109.7 kg)   SpO2 98%   BMI 44.23 kg/m  Wt Readings from Last 3 Encounters:  01/08/22 241 lb 12.8 oz (109.7 kg)  12/26/21 243 lb (110.2 kg)  07/05/21 246 lb 3.2 oz (111.7 kg)    Lab Results  Component Value Date   TSH 2.110 04/03/2021   Lab Results  Component Value Date   WBC 9.0 01/08/2022   HGB 13.8 01/08/2022   HCT 41.5 01/08/2022   MCV 86 01/08/2022   PLT 351 01/08/2022   Lab Results  Component Value Date   NA 141 01/08/2022   K 4.2 01/08/2022   CO2 22 01/08/2022   GLUCOSE 81 01/08/2022   BUN 9 01/08/2022   CREATININE 0.84 01/08/2022   BILITOT 1.0 01/08/2022   ALKPHOS 92 01/08/2022   AST 15 01/08/2022   ALT 11 01/08/2022   PROT 7.2 01/08/2022   ALBUMIN 4.1 01/08/2022   CALCIUM 9.4 01/08/2022   ANIONGAP 6 06/15/2021   EGFR 92 01/08/2022   Lab Results  Component Value Date   CHOL 185 04/03/2021   Lab Results  Component Value Date   HDL 42 04/03/2021   Lab Results   Component Value Date   LDLCALC 125 (H) 04/03/2021   Lab Results  Component Value Date   TRIG 98 04/03/2021   Lab Results  Component Value Date   CHOLHDL 4.4 04/03/2021   Lab Results  Component Value Date   HGBA1C 5.5 04/03/2021      Assessment & Plan:   Problem List Items Addressed This Visit       Cardiovascular and Mediastinum   Intractable chronic migraine without aura and without status migrainosus - Primary    Recently started Ajovy On Topamax Followed by neurology      Relevant Orders   CBC with Differential/Platelet (Completed)   CMP14+EGFR (Completed)     Other   Seizures ([place])    On Keppra Seizure-free for the last 2 years Followed by neurology      Relevant Orders   CBC with Differential/Platelet (Completed)   CMP14+EGFR (Completed)   GAD (generalized anxiety disorder)    Overall well-controlled with Celexa Added Vistaril as needed for spells of anxiety      Relevant Medications   hydrOXYzine (VISTARIL) 25 MG capsule   Other Visit Diagnoses     Abnormal menstrual cycle       Relevant Orders   POCT urine pregnancy (Completed)       Meds ordered this encounter  Medications   hydrOXYzine (VISTARIL) 25 MG capsule    Sig: Take 1 capsule (25 mg total) by mouth  every 8 (eight) hours as needed for anxiety.    Dispense:  30 capsule    Refill:  1    Follow-up: Return in about 6 months (around 07/09/2022) for Annual physical.    Lindell Spar, MD

## 2022-01-09 NOTE — Assessment & Plan Note (Addendum)
Overall well-controlled with Celexa Added Vistaril as needed for spells of anxiety

## 2022-01-09 NOTE — Assessment & Plan Note (Signed)
On Keppra Seizure-free for the last 2 years Followed by neurology

## 2022-01-09 NOTE — Assessment & Plan Note (Signed)
Recently started Ajovy On Topamax Followed by neurology

## 2022-02-19 DIAGNOSIS — R569 Unspecified convulsions: Secondary | ICD-10-CM | POA: Diagnosis not present

## 2022-02-19 DIAGNOSIS — W19XXXA Unspecified fall, initial encounter: Secondary | ICD-10-CM | POA: Diagnosis not present

## 2022-03-24 ENCOUNTER — Telehealth: Payer: Self-pay | Admitting: Adult Health

## 2022-03-24 NOTE — Telephone Encounter (Signed)
1 yr supply was given 12/26/21 contacted pt pharmacy, they stated pt closed the medication out meaning she told someone she no longer takes the med. I contacted pt, LMP per DPR, informing her of this and to contact pharmacy for filling.

## 2022-03-24 NOTE — Telephone Encounter (Signed)
Pt is calling. Stated she called [facility] [phone] to get a refill on levETIRAcetam (KEPPRA XR) 750 MG 24 hr tablet and was told this medication was closed out and she needed to contact her doctor. Office. Pt is requesting someone please follow-up with [facility] because she only have six pills left. Pt is requesting call back.

## 2022-05-28 ENCOUNTER — Encounter: Payer: Self-pay | Admitting: Adult Health

## 2022-05-28 ENCOUNTER — Telehealth (INDEPENDENT_AMBULATORY_CARE_PROVIDER_SITE_OTHER): Payer: Medicaid Other | Admitting: Adult Health

## 2022-05-28 DIAGNOSIS — G44209 Tension-type headache, unspecified, not intractable: Secondary | ICD-10-CM | POA: Diagnosis not present

## 2022-05-28 DIAGNOSIS — G43709 Chronic migraine without aura, not intractable, without status migrainosus: Secondary | ICD-10-CM | POA: Diagnosis not present

## 2022-05-28 DIAGNOSIS — R569 Unspecified convulsions: Secondary | ICD-10-CM

## 2022-05-28 MED ORDER — TOPIRAMATE 100 MG PO TABS: 100.0000 mg | ORAL_TABLET | Freq: Every day | ORAL | 11 refills | Status: DC

## 2022-05-28 MED ORDER — TOPIRAMATE 50 MG PO TABS: 50.0000 mg | ORAL_TABLET | Freq: Every morning | ORAL | 5 refills | Status: DC

## 2022-05-28 NOTE — Progress Notes (Signed)
[facility] [address] [place]. [place] [address] [phone]       OFFICE FOLLOW UP NOTE  Alexandra Garrett Date of Birth:  [birth year] Medical Record Number:  [id number]   Referring MD: Hurshel Party Reason for Referral: Seizure, headaches, memory loss   Virtual Visit via Video Note  I connected with Alexandra Garrett on 05/28/22 at  1:15 PM EDT by a video enabled telemedicine application and verified that I am speaking with the correct person using two identifiers.  Location: Patient: at home Provider: at home   I discussed the limitations of evaluation and management by telemedicine and the availability of in person appointments. The patient expressed understanding and agreed to proceed.      HPI:   Update 05/28/2022 JM: Patient returns for migraine and seizure follow-up.  At prior visit, she was started on Ajovy for migraine prevention and continued on topiramate and started tizanidine and Zofran as needed.  She was also switched from [place] IR to Keppra XR as difficulty remembering twice daily dosing.  Reports significant improvement of migraines since starting on Ajovy.  She has only had 2-3 migraines over the past 5 months.  Also continues on topiramate 100 mg twice daily.  Will take tizanidine with migraine onset with benefit.  Denies any seizure activity since prior visit.  Feels like she is doing better on Keppra XR than IR formulation.   Memory difficulties stable since prior visit    History provided for reference purposes only Update 12/26/2021 JM: Patient returns for follow-up visit regarding history of migraine headaches and seizures.  She is accompanied by her husband.  She reports continued mixed migrainous and tension type headaches.  She has been experiencing almost daily tension type headaches and about 16 migrainous days per month.  She did not start Emgality as previously ordered, reports her pharmacy told her insurance did not  approve but no information about this in epic.  She reports PCP discontinued propranolol back in April and has since been experiencing worsening headaches but this had been previously discontinued by cardiology due to bradycardia which was fully discussed at prior visit as well.  She has remained on topiramate 100 mg twice daily, tolerating without side effects. She did not trial use of tizanidine for severe headaches/migraines as she was not aware of this prescription.  She can have nausea with severe migraines.  Remains on Keppra 750 mg twice daily, reports typical seizure back in March, does admit to significantly increased stress at that time.  Denies missing any medication dosage.  She has not had any additional events since that time.  She questions Keppra XR formulation due to ease of only taking once daily.  She continues to experience memory loss but this has been stable since prior visit.  She believes her depression/anxiety are under fairly good control on citalopram, she can have occasional increased anxiety at times.  This is currently being managed by her PCP.   Update 03/26/2021 JM: Returns for routine follow-up after prior visit 8 months ago for headaches, seizures and memory loss concerns.  Pt eval by Dr. Rexene Alberts for suspected sleep apnea in 09/2020 with completion of sleep study 12/2020 which did not show evidence of sleep apnea. Upon call for these results, she disucssed with RN headaches worsening and restarted propranolol on her own despite this previously being discontinued by cardiology due to bradycardia - recommended increasing topiramate dosage from 50mg  BID to 50mg  AM and 100mg  PM and stop  propranolol per cardiology recommendations.  Tolerating current dose of topiramate with some benefit after increasing but still c/o daily tension type headaches still bitemporal and occasionally occipital. She will also have approx 15 worsening headaches per month which seem to be more migrainous  type.   Has remained on keppra 750 mg twice daily without side effects and no seizure activity  Short-term memory loss improved since prior visit - has been doing compensation strategies to further help Referred to [facility] but per patient, never called - per referral review, it was denied as [facility] not accepting new referrals. Still having issues with depression/anxiety which can trigger worsening headaches/migraines   No further concerns at this time  Update 07/18/2020 JM: Alexandra Garrett returns for sooner visit to review recent test results and continued memory concerns and headaches.  She is accompanied by her husband.  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  She is also been experiencing frequent headaches located bitemporally with constant pressure which can fluctuate throughout the day at times associated with photophobia and phonophobia.  Denies any visual changes.  At times memory can be worse with worsening headache.  She does admit to greatly increased stress levels as well as underlying untreated anxiety.  She will take Aleve frequently throughout the day without much benefit.  Reports sleeping well at night but does have daytime fatigue and husband reports snoring and witnessed apnea.  Thankfully, she has not had any additional seizures that she is aware of and has been tolerating Keppra 750 mg twice daily without side effects.  No further concerns at this time  Consult visit 06/14/2020 Dr. Leonie Man: Alexandra Garrett is a 37 year old Caucasian lady  seen today for initial office consultation visit for seizures.  History is obtained from the patient, review of electronic medical records and I personally reviewed available imaging films in PACS.  Patient has past medical history of anemia, seizures.  Her seizures began at approximately age 34 and continued for around 10 years and stopped at age 34.  She was seen by Dr. Gaynell Face in was on Tegretol which worked quite well.  Seizures were described as being generalized tonic-clonic with tongue bite and injury.  Tegretol was tapered off and she did well and remained seizure-free for for almost 10 years and started having nocturnal seizures from age 39.  The patient states she knows she is at a seizure because she wakes up with a bad headache and occasionally has tongue bites as well as feels muscles are sore and she is tired for the next day.  She was seen in the emergency room at [facility] where she lived for several times for the seizures.  She has moved to [place] 7 years ago and has seizures around 102/year.  She is has been seen Dr. Merlene Laughter.  He did a polysomnogram which was apparently unremarkable.  He started her on Keppra 500 mg twice daily which she is tolerating well without any side effects.  Her last seizure was 2 3 weeks ago and the EMS were called but by the time they arrived patient  was recovering back to baseline and refused to go to the ER.  She states she is been compliant with her Keppra and has not missed a single dose.  She states she has been starting having headaches after seizures and at times even memory loss.  Seizures often triggered by stress and lack of sleep.  She had a Mini-Mental status exam done today and scored   26 out of 30 with deficits in orientation and attention calculation and recall.  She is worried that her memory loss after seizure is a new finding.  The last MRI scan of the brain she had was on 10/19/2015 which was unremarkable except for small developmental venous  anomaly in the right posterior temporal lobe.  I was unable to get records from Dr. Merlene Laughter, Dr. Gaynell Face or any prior EEG studies for my review today    ROS:   14 system review of systems is positive for those listed in HPI and all other systems negative   PMH:  Past Medical History:  Diagnosis Date   Anemia    Headache    History of medication noncompliance    Seizures ([place])    Since age 50 yrs    Social History:  Social History   Socioeconomic History   Marital status: Married    Spouse name: Jeneen Rinks   Number of children: 2   Years of education: some college   Highest education level: Not on file  Occupational History   Not on file  Tobacco Use   Smoking status: Never   Smokeless tobacco: Never  Vaping Use   Vaping Use: Never used  Substance and Sexual Activity   Alcohol use: No    Alcohol/week: 0.0 standard drinks of alcohol   Drug use: No   Sexual activity: Yes    Birth control/protection: I.U.D.  Other Topics Concern   Not on file  Social History Narrative   Right handed   Soda: 2-6 per day   Works at [employer].   Married April 2020,second marriage.Lives with husband and 2 kids.      Social Determinants of Health   Financial Resource Strain: Low Risk  (05/15/2021)   Overall Financial Resource Strain (CARDIA)    Difficulty of Paying Living Expenses: Not very hard  Food Insecurity: No Food Insecurity (05/15/2021)   Hunger Vital Sign    Worried About Running Out of Food in the Last Year: Never true    Ran Out of Food in the Last Year: Never true  Transportation Needs: No Transportation Needs (05/15/2021)   PRAPARE - Hydrologist (Medical): No    Lack of Transportation (Non-Medical): No  Physical Activity: Insufficiently Active (05/15/2021)   Exercise Vital Sign    Days of Exercise per Week: 5 days    Minutes of Exercise per Session: 20 min  Stress: Stress Concern Present (05/15/2021)   [place]    Feeling of Stress : To some extent  Social Connections: Socially Integrated (05/15/2021)   Social Connection and Isolation Panel [NHANES]    Frequency of Communication with Friends and Family: More than three times a week    Frequency of Social Gatherings with Friends and Family: Once a week    Attends Religious Services: More than 4 times per year    Active Member of [employer] or Organizations: Yes    Attends Archivist Meetings: More than 4 times per year    Marital  Status: Married  Human resources officer Violence: Not At Risk (05/15/2021)   Humiliation, Afraid, Rape, and Kick questionnaire    Fear of Current or Ex-Partner: No    Emotionally Abused: No    Physically Abused: No    Sexually Abused: No    Medications:   Current Outpatient Medications on File Prior to Visit  Medication Sig Dispense Refill   Cholecalciferol (VITAMIN D3) 25 MCG (1000 UT) CAPS Take 1 capsule by mouth daily.     citalopram (CELEXA) 10 MG tablet TAKE 1 TABLET(10 MG) BY MOUTH DAILY 90 tablet 1   Fremanezumab-vfrm (AJOVY) 225 MG/1.5ML SOAJ Inject 225 mg into the skin every 30 (thirty) days. 1.68 mL 11   hydrOXYzine (VISTARIL) 25 MG capsule Take 1 capsule (25 mg total) by mouth every 8 (eight) hours as needed for anxiety. 30 capsule 1   levETIRAcetam (KEPPRA XR) 750 MG 24 hr tablet Take 2 tablets (1,500 mg total) by mouth daily. 60 tablet 11   levonorgestrel (MIRENA) 20 MCG/24HR IUD 1 each by Intrauterine route once.     ondansetron (ZOFRAN-ODT) 4 MG disintegrating tablet Take 1 tablet (4 mg total) by mouth every 8 (eight) hours as needed for nausea or vomiting. 20 tablet 11   tiZANidine (ZANAFLEX) 4 MG tablet Take 1 tablet (4 mg total) by mouth at bedtime. 15 tablet 11   topiramate (TOPAMAX) 100 MG tablet Take 1 tablet (100 mg total) by mouth 2 (two) times daily. 60 tablet 11   No current facility-administered medications on file prior to visit.    Allergies:  No Known  Allergies  Physical Exam N/A d/t visit type       ASSESSMENT: 37 year old Caucasian lady with with childhood history of generalized epilepsy which she had outgrown but started having nocturnal seizures around 2010.  Also complains of short-term memory concerns and frequent headaches. Sleep study 12/2020 negative for sleep apnea     PLAN:  Nocturnal seizures -Reports typical seizure in 05/2021 -Possibly provoked in setting of extreme stress -As no additional seizures since that time, continue Keppra XR 1500 mg nightly -She was advised to call office with any additional seizure activity.  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  -Recommend gradually decreasing topiramate, will decrease to 50mg  AM and 100mg  PM. Can discuss further decreasing at prior visit if remains stable. Advised to go back to 100mg  BID dosing if headaches worsen with lower dosage -Continue tizanidine 4 mg nightly for severe migraine headache -Use of Zofran as needed for nausea associated with severe migraine headache   Short-term memory loss -Overall stable, nonprogressive -No clear cause, suspect multifactoral -Discussed importance of depression/anxiety management as well as stress management      Follow-up in 6 months or call earlier if needed     I spent 16 minutes of face-to-face and non-face-to-face time with patient via MyChart video visit.  This included previsit chart  review, lab review, study review, order entry, electronic health record documentation, patient education and discussion regarding above diagnoses, further treatment options and education and answered all other  questions to patients satisfaction   Frann Rider, [facility]  [facility] [address] [place], [place] [address]  Phone [phone] Fax [phone] Note: This document was prepared with digital dictation and possible smart phrase technology. Any transcriptional errors that result from this process are unintentional.

## 2022-05-28 NOTE — Patient Instructions (Signed)
It was great to see you again today and doing better!   Continue monthly Ajovy injections  As your migraines improved, recommend slightly decreasing topiramate dosage from 100mg  twice daily to 50mg  in the morning and 100 mg at night. If headaches should worsen with lower dose, please restart 100mg  twice daily dosing. If headaches remain under good control on lower dosage at follow up visit, we can discuss further reducing dosage  Continue tizanidine as needed for headaches  Continue Keppra XR 1500 mg daily for seizure prevention    Follow-up in 6 months or call earlier if needed

## 2022-06-05 ENCOUNTER — Telehealth: Payer: Self-pay

## 2022-06-05 NOTE — Telephone Encounter (Signed)
PA# [id number] 06/05/22- 06/05/23

## 2022-06-05 NOTE — Telephone Encounter (Signed)
PA sent to [facility]/Ajovy/[facility] Key: [id number] PA was approved Key: [id number]

## 2022-06-28 ENCOUNTER — Other Ambulatory Visit: Payer: Self-pay | Admitting: Internal Medicine

## 2022-06-28 DIAGNOSIS — F411 Generalized anxiety disorder: Secondary | ICD-10-CM

## 2022-07-10 ENCOUNTER — Encounter: Payer: Medicaid Other | Admitting: Internal Medicine

## 2022-08-26 ENCOUNTER — Encounter: Payer: Medicaid Other | Admitting: Internal Medicine

## 2022-09-25 ENCOUNTER — Ambulatory Visit: Payer: Medicaid Other | Admitting: Adult Health

## 2022-09-25 ENCOUNTER — Encounter: Payer: Self-pay | Admitting: Adult Health

## 2022-09-25 VITALS — BP 103/75 | HR 59 | Ht 62.0 in | Wt 259.0 lb

## 2022-09-25 DIAGNOSIS — R232 Flushing: Secondary | ICD-10-CM | POA: Diagnosis not present

## 2022-09-25 DIAGNOSIS — R4586 Emotional lability: Secondary | ICD-10-CM

## 2022-09-25 DIAGNOSIS — R5383 Other fatigue: Secondary | ICD-10-CM | POA: Diagnosis not present

## 2022-09-25 DIAGNOSIS — R61 Generalized hyperhidrosis: Secondary | ICD-10-CM

## 2022-09-25 MED ORDER — VEOZAH 45 MG PO TABS: 1.0000 | ORAL_TABLET | Freq: Every day | ORAL | Status: DC

## 2022-09-25 NOTE — Progress Notes (Signed)
  Subjective:     Patient ID: Alexandra Garrett, female   DOB: [birth year], 37 y.o.   MRN: [id number]  HPI Alexandra Garrett is a 37 year old white female,married, G2P2002, in complaining of hot flashes,night sweats, mood swings and fatigue. Has mirena IUD placed, 12/19/15, no period.     Component Value Date/Time   DIAGPAP  05/15/2021 0914    - Negative for intraepithelial lesion or malignancy (NILM)   HPVHIGH Negative 05/15/2021 0914   ADEQPAP  05/15/2021 0914    Satisfactory for evaluation; transformation zone component PRESENT.   PCP is Dr Allena Katz  Review of Systems +hot flashes +night sweats,then not sleeping well +mood swings +fatigue Reviewed past medical,surgical, social and family history. Reviewed medications and allergies.     Objective:   Physical Exam BP 103/75 (BP Location: Right Arm, Patient Position: Sitting, Cuff Size: Large)   Pulse (!) 59   Ht 5\' 2"  (1.575 m)   Wt 259 lb (117.5 kg)   BMI 47.37 kg/m     Skin warm and dry. Lungs: clear to ausculation bilaterally. Cardiovascular: regular rate and rhythm.  Fall risk is low  Upstream - 09/25/22 1006       Pregnancy Intention Screening   Does the patient want to become pregnant in the next year? No    Does the patient's partner want to become pregnant in the next year? No    Would the patient like to discuss contraceptive options today? No      Contraception Wrap Up   Current Method IUD or IUS    End Method IUD or IUS             Assessment:     1. Hot flashes +hot flashes Will check thyroid Will try veozah, 14 tablets given to try, if helps, will send in RX Meds ordered this encounter  Medications   Fezolinetant (VEOZAH) 45 MG TABS    Sig: Take 1 tablet (45 mg total) by mouth daily.    Order Specific Question:   Supervising Provider    Answer:   Duane Lope H [[id number]]    - TSH + free T4  2. Night sweats Sleep in lite clothes, use fan Get up when hot and change beds Will try Veozah  - TSH + free  T4  3. Mood swings  - TSH + free T4  4. Other fatigue  - Comp Met (CMET) - CBC w/Diff     Plan:     Follow up in 5 weeks for ROS

## 2022-09-26 LAB — COMPREHENSIVE METABOLIC PANEL
ALT: 17 IU/L (ref 0–32)
AST: 16 IU/L (ref 0–40)
Albumin: 4.2 g/dL (ref 3.9–4.9)
Alkaline Phosphatase: 95 IU/L (ref 44–121)
BUN/Creatinine Ratio: 20 (ref 9–23)
BUN: 18 mg/dL (ref 6–20)
Bilirubin Total: 0.7 mg/dL (ref 0.0–1.2)
CO2: 19 mmol/L — ABNORMAL LOW (ref 20–29)
Calcium: 9.1 mg/dL (ref 8.7–10.2)
Chloride: 105 mmol/L (ref 96–106)
Creatinine, Ser: 0.92 mg/dL (ref 0.57–1.00)
Globulin, Total: 3.1 g/dL (ref 1.5–4.5)
Glucose: 88 mg/dL (ref 70–99)
Potassium: 4.3 mmol/L (ref 3.5–5.2)
Sodium: 138 mmol/L (ref 134–144)
Total Protein: 7.3 g/dL (ref 6.0–8.5)
eGFR: 82 mL/min/{1.73_m2} (ref >59)

## 2022-09-26 LAB — CBC WITH DIFFERENTIAL/PLATELET
Basophils Absolute: 0.1 10*3/uL (ref 0.0–0.2)
Basos: 1 %
EOS (ABSOLUTE): 0.2 10*3/uL (ref 0.0–0.4)
Eos: 2 %
Hematocrit: 45.8 % (ref 34.0–46.6)
Hemoglobin: 14.9 g/dL (ref 11.1–15.9)
Immature Grans (Abs): 0.1 10*3/uL (ref 0.0–0.1)
Immature Granulocytes: 1 %
Lymphocytes Absolute: 3.6 10*3/uL — ABNORMAL HIGH (ref 0.7–3.1)
Lymphs: 31 %
MCH: 29 pg (ref 26.6–33.0)
MCHC: 32.5 g/dL (ref 31.5–35.7)
MCV: 89 fL (ref 79–97)
Monocytes Absolute: 0.8 10*3/uL (ref 0.1–0.9)
Monocytes: 7 %
Neutrophils Absolute: 7 10*3/uL (ref 1.4–7.0)
Neutrophils: 58 %
Platelets: 364 10*3/uL (ref 150–450)
RBC: 5.13 x10E6/uL (ref 3.77–5.28)
RDW: 12.9 % (ref 11.7–15.4)
WBC: 11.8 10*3/uL — ABNORMAL HIGH (ref 3.4–10.8)

## 2022-09-26 LAB — TSH+FREE T4
Free T4: 0.98 ng/dL (ref 0.82–1.77)
TSH: 2.31 u[IU]/mL (ref 0.450–4.500)

## 2022-10-10 ENCOUNTER — Other Ambulatory Visit: Payer: Self-pay | Admitting: Adult Health

## 2022-10-10 MED ORDER — VEOZAH 45 MG PO TABS
1.0000 | ORAL_TABLET | Freq: Every day | ORAL | 2 refills | Status: AC
Start: 2022-10-10 — End: ?

## 2022-10-10 NOTE — Progress Notes (Signed)
Rx sent for veozah

## 2022-10-13 ENCOUNTER — Ambulatory Visit (INDEPENDENT_AMBULATORY_CARE_PROVIDER_SITE_OTHER): Payer: Medicaid Other | Admitting: Internal Medicine

## 2022-10-13 ENCOUNTER — Encounter: Payer: Self-pay | Admitting: Internal Medicine

## 2022-10-13 VITALS — BP 114/77 | HR 75 | Ht 62.0 in | Wt 263.2 lb

## 2022-10-13 DIAGNOSIS — E782 Mixed hyperlipidemia: Secondary | ICD-10-CM

## 2022-10-13 DIAGNOSIS — G43719 Chronic migraine without aura, intractable, without status migrainosus: Secondary | ICD-10-CM

## 2022-10-13 DIAGNOSIS — R569 Unspecified convulsions: Secondary | ICD-10-CM

## 2022-10-13 DIAGNOSIS — F411 Generalized anxiety disorder: Secondary | ICD-10-CM | POA: Diagnosis not present

## 2022-10-13 DIAGNOSIS — E559 Vitamin D deficiency, unspecified: Secondary | ICD-10-CM

## 2022-10-13 DIAGNOSIS — R232 Flushing: Secondary | ICD-10-CM

## 2022-10-13 DIAGNOSIS — R7303 Prediabetes: Secondary | ICD-10-CM

## 2022-10-13 DIAGNOSIS — Z0001 Encounter for general adult medical examination with abnormal findings: Secondary | ICD-10-CM | POA: Diagnosis not present

## 2022-10-13 MED ORDER — SEMAGLUTIDE-WEIGHT MANAGEMENT 0.25 MG/0.5ML ~~LOC~~ SOAJ: 0.2500 mg | SUBCUTANEOUS | 0 refills | Status: DC

## 2022-10-13 MED ORDER — SEMAGLUTIDE-WEIGHT MANAGEMENT 0.5 MG/0.5ML ~~LOC~~ SOAJ: 0.5000 mg | SUBCUTANEOUS | 0 refills | Status: DC

## 2022-10-13 NOTE — Assessment & Plan Note (Signed)
On Keppra Seizure-free for the last 2 years Followed by neurology 

## 2022-10-13 NOTE — Assessment & Plan Note (Signed)
Well controlled with Ajovy On Topamax Followed by neurology

## 2022-10-13 NOTE — Assessment & Plan Note (Addendum)
Overall well-controlled with Celexa Has Vistaril as needed for spells of anxiety

## 2022-10-13 NOTE — Patient Instructions (Signed)
Please start taking Wegovy as prescribed.  Please cut down soft drinks and follow low carb diet. Perform moderate exercise/walking at least 150 mins/week.

## 2022-10-13 NOTE — Progress Notes (Unsigned)
Established Patient Office Visit  Subjective:  Patient ID: Alexandra Garrett, female    DOB: [birth year]  Age: 37 y.o. MRN: [id number]  CC:  Chief Complaint  Patient presents with   Annual Exam    HPI Alexandra Garrett is a 37 y.o. female with past medical history of seizure disorder, migraine and GAD who presents for annual physical.  She follows up with neurology for history of seizure disorder and migraine.  She is on Keppra for seizures.  She has not had any seizures for the last 2 years.  She was started on Ajovy for migraine. She is also on Topamax for it.    Her anxiety is well controlled with Celexa now.  She denies any episode of panic, but has spells of stress/anxiety at times. Denies any anhedonia, SI or HI.  Obesity: She has been trying to follow low-carb diet, but is having difficulty losing weight.  After lengthy discussion about medical weight loss options, she agrees to start [facility].  She is motivated to follow low-carb diet and perform regular exercise.  Past Medical History:  Diagnosis Date   Anemia    Headache    History of medication noncompliance    Seizures (HCC)    Since age 11 yrs    Past Surgical History:  Procedure Laterality Date   NO PAST SURGERIES      Family History  Problem Relation Age of Onset   Arthritis Mother    Depression Mother    Hypertension Mother    Breast cancer Mother        lymph nodes   Heart disease Father    Arthritis Maternal Grandmother    Cancer Maternal Grandmother        breast   Diabetes Maternal Grandmother    Cancer Maternal Grandfather        lung   Stroke Paternal Grandmother    Heart disease Paternal Grandfather     Social History   Socioeconomic History   Marital status: Married    Spouse name: Fayrene Fearing   Number of children: 2   Years of education: some college   Highest education level: Not on file  Occupational History   Not on file  Tobacco Use   Smoking status: Never   Smokeless tobacco: Never   Vaping Use   Vaping status: Never Used  Substance and Sexual Activity   Alcohol use: No    Alcohol/week: 0.0 standard drinks of alcohol   Drug use: No   Sexual activity: Yes    Birth control/protection: I.U.D.  Other Topics Concern   Not on file  Social History Narrative   Right handed   Soda: 2-6 per day   Works at [employer].   Married April 2020,second marriage.Lives with husband and 2 kids.      Social Determinants of Health   Financial Resource Strain: Low Risk  (05/15/2021)   Overall Financial Resource Strain (CARDIA)    Difficulty of Paying Living Expenses: Not very hard  Food Insecurity: No Food Insecurity (05/15/2021)   Hunger Vital Sign    Worried About Running Out of Food in the Last Year: Never true    Ran Out of Food in the Last Year: Never true  Transportation Needs: No Transportation Needs (05/15/2021)   PRAPARE - Administrator, Civil Service (Medical): No    Lack of Transportation (Non-Medical): No  Physical Activity: Insufficiently Active (05/15/2021)   Exercise Vital Sign    Days of Exercise per  Week: 5 days    Minutes of Exercise per Session: 20 min  Stress: Stress Concern Present (05/15/2021)   [employer] of Occupational Health - Occupational Stress Questionnaire    Feeling of Stress : To some extent  Social Connections: Socially Integrated (05/15/2021)   Social Connection and Isolation Panel [NHANES]    Frequency of Communication with Friends and Family: More than three times a week    Frequency of Social Gatherings with Friends and Family: Once a week    Attends Religious Services: More than 4 times per year    Active Member of [employer] or Organizations: Yes    Attends Engineer, structural: More than 4 times per year    Marital Status: Married  Catering manager Violence: Not At Risk (05/15/2021)   Humiliation, Afraid, Rape, and Kick questionnaire    Fear of Current or Ex-Partner: No    Emotionally Abused: No    Physically Abused:  No    Sexually Abused: No    Outpatient Medications Prior to Visit  Medication Sig Dispense Refill   Cholecalciferol (VITAMIN D3) 25 MCG (1000 UT) CAPS Take 1 capsule by mouth daily.     citalopram (CELEXA) 10 MG tablet TAKE 1 TABLET(10 MG) BY MOUTH DAILY 90 tablet 1   Fremanezumab-vfrm (AJOVY) 225 MG/1.5ML SOAJ Inject 225 mg into the skin every 30 (thirty) days. 1.68 mL 11   hydrOXYzine (VISTARIL) 25 MG capsule Take 1 capsule (25 mg total) by mouth every 8 (eight) hours as needed for anxiety. 30 capsule 1   levETIRAcetam (KEPPRA XR) 750 MG 24 hr tablet Take 2 tablets (1,500 mg total) by mouth daily. 60 tablet 11   levonorgestrel (MIRENA) 20 MCG/24HR IUD 1 each by Intrauterine route once.     ondansetron (ZOFRAN-ODT) 4 MG disintegrating tablet Take 1 tablet (4 mg total) by mouth every 8 (eight) hours as needed for nausea or vomiting. 20 tablet 11   tiZANidine (ZANAFLEX) 4 MG tablet Take 1 tablet (4 mg total) by mouth at bedtime. 15 tablet 11   topiramate (TOPAMAX) 50 MG tablet Take 1 tablet (50 mg total) by mouth in the morning. (Patient taking differently: Take 50 mg by mouth 2 (two) times daily.) 30 tablet 5   Fezolinetant (VEOZAH) 45 MG TABS Take 1 tablet (45 mg total) by mouth daily. 30 tablet 2   No facility-administered medications prior to visit.    No Known Allergies  ROS Review of Systems  Constitutional:  Negative for chills and fever.  HENT:  Negative for congestion, sinus pressure, sinus pain and sore throat.   Eyes:  Negative for pain and discharge.  Respiratory:  Negative for cough and shortness of breath.   Cardiovascular:  Negative for chest pain and palpitations.  Gastrointestinal:  Negative for abdominal pain, diarrhea, nausea and vomiting.  Endocrine: Negative for polydipsia and polyuria.  Genitourinary:  Negative for dysuria and hematuria.  Musculoskeletal:  Negative for neck pain and neck stiffness.  Skin:  Negative for rash.  Neurological:  Positive for  headaches. Negative for dizziness and weakness.  Psychiatric/Behavioral:  Positive for sleep disturbance. Negative for agitation and behavioral problems. The patient is not nervous/anxious.       Objective:    Physical Exam Vitals reviewed.  Constitutional:      General: She is not in acute distress.    Appearance: She is obese. She is not diaphoretic.  HENT:     Head: Normocephalic and atraumatic.     Nose: Nose normal.  Mouth/Throat:     Mouth: Mucous membranes are moist.  Eyes:     General: No scleral icterus.    Extraocular Movements: Extraocular movements intact.  Cardiovascular:     Rate and Rhythm: Normal rate and regular rhythm.     Heart sounds: Normal heart sounds. No murmur heard. Pulmonary:     Breath sounds: Normal breath sounds. No wheezing or rales.  Abdominal:     Palpations: Abdomen is soft.     Tenderness: There is no abdominal tenderness.  Musculoskeletal:     Cervical back: Neck supple. No tenderness.     Right lower leg: No edema.     Left lower leg: No edema.  Skin:    General: Skin is warm.     Findings: No rash.  Neurological:     General: No focal deficit present.     Mental Status: She is alert and oriented to person, place, and time.     Cranial Nerves: No cranial nerve deficit.     Sensory: No sensory deficit.     Motor: No weakness.  Psychiatric:        Mood and Affect: Mood normal.        Behavior: Behavior normal.     BP 114/77 (BP Location: Right Arm, Patient Position: Sitting, Cuff Size: Large)   Pulse 75   Ht 5\' 2"  (1.575 m)   Wt 263 lb 3.2 oz (119.4 kg)   SpO2 96%   BMI 48.14 kg/m  Wt Readings from Last 3 Encounters:  10/13/22 263 lb 3.2 oz (119.4 kg)  09/25/22 259 lb (117.5 kg)  01/08/22 241 lb 12.8 oz (109.7 kg)    Lab Results  Component Value Date   TSH 2.310 09/25/2022   Lab Results  Component Value Date   WBC 11.8 (H) 09/25/2022   HGB 14.9 09/25/2022   HCT 45.8 09/25/2022   MCV 89 09/25/2022   PLT 364  09/25/2022   Lab Results  Component Value Date   NA 138 09/25/2022   K 4.3 09/25/2022   CO2 19 (L) 09/25/2022   GLUCOSE 88 09/25/2022   BUN 18 09/25/2022   CREATININE 0.92 09/25/2022   BILITOT 0.7 09/25/2022   ALKPHOS 95 09/25/2022   AST 16 09/25/2022   ALT 17 09/25/2022   PROT 7.3 09/25/2022   ALBUMIN 4.2 09/25/2022   CALCIUM 9.1 09/25/2022   ANIONGAP 6 06/15/2021   EGFR 82 09/25/2022   Lab Results  Component Value Date   CHOL 185 04/03/2021   Lab Results  Component Value Date   HDL 42 04/03/2021   Lab Results  Component Value Date   LDLCALC 125 (H) 04/03/2021   Lab Results  Component Value Date   TRIG 98 04/03/2021   Lab Results  Component Value Date   CHOLHDL 4.4 04/03/2021   Lab Results  Component Value Date   HGBA1C 5.5 04/03/2021      Assessment & Plan:   Problem List Items Addressed This Visit       Cardiovascular and Mediastinum   Intractable chronic migraine without aura and without status migrainosus    Well controlled with Ajovy On Topamax Followed by neurology      Hot flashes    On Veozah, followed by OB/GYN        Other   GAD (generalized anxiety disorder) (Chronic)    Overall well-controlled with Celexa Has Vistaril as needed for spells of anxiety      Seizures (HCC)    On  Keppra Seizure-free for the last 2 years Followed by neurology      Morbid obesity (HCC)    BMI Readings from Last 3 Encounters:  10/13/22 48.14 kg/m  09/25/22 47.37 kg/m  01/08/22 44.23 kg/m   Diet modification and moderate exercise advised Started Wegovy-initial BMI: 48.14, plan to increase dose as tolerated      Relevant Medications   Semaglutide-Weight Management 0.25 MG/0.5ML SOAJ   Semaglutide-Weight Management 0.5 MG/0.5ML SOAJ (Start on 11/11/2022)   Vitamin D deficiency    Last vitamin D Lab Results  Component Value Date   VD25OH 22.2 (L) 04/03/2021   Advised to take vitamin D 5000 IU daily      Relevant Orders   Vitamin D (25  hydroxy)   Mixed hyperlipidemia    Check lipid profile Advised to follow low-carb and low cholesterol diet for now      Relevant Orders   Lipid Profile   Encounter for general adult medical examination with abnormal findings - Primary    Physical exam as documented. Counseling done  re healthy lifestyle involving commitment to 150 minutes exercise per week, heart healthy diet, and attaining healthy weight.The importance of adequate sleep also discussed. Changes in health habits are decided on by the patient with goals and time frames  set for achieving them. Immunization and cancer screening needs are specifically addressed at this visit.      Prediabetes    Lab Results  Component Value Date   HGBA1C 5.5 04/03/2021   Advised to follow low carb diet Started FAOZHY for weight loss      Relevant Orders   Hemoglobin A1c    Meds ordered this encounter  Medications   Semaglutide-Weight Management 0.25 MG/0.5ML SOAJ    Sig: Inject 0.25 mg into the skin once a week for 28 days.    Dispense:  2 mL    Refill:  0   Semaglutide-Weight Management 0.5 MG/0.5ML SOAJ    Sig: Inject 0.5 mg into the skin once a week for 28 days.    Dispense:  2 mL    Refill:  0    Follow-up: Return in about 3 months (around 01/13/2023) for Weight management.    Anabel Halon, MD

## 2022-10-14 ENCOUNTER — Other Ambulatory Visit: Payer: Self-pay | Admitting: *Deleted

## 2022-10-14 MED ORDER — VEOZAH 45 MG PO TABS: 1.0000 | ORAL_TABLET | Freq: Every day | ORAL | 2 refills | Status: DC

## 2022-10-16 DIAGNOSIS — Z0001 Encounter for general adult medical examination with abnormal findings: Secondary | ICD-10-CM | POA: Insufficient documentation

## 2022-10-16 DIAGNOSIS — R7303 Prediabetes: Secondary | ICD-10-CM | POA: Insufficient documentation

## 2022-10-16 NOTE — Assessment & Plan Note (Signed)
BMI Readings from Last 3 Encounters:  10/13/22 48.14 kg/m  09/25/22 47.37 kg/m  01/08/22 44.23 kg/m   Diet modification and moderate exercise advised Started Wegovy-initial BMI: 48.14, plan to increase dose as tolerated

## 2022-10-16 NOTE — Assessment & Plan Note (Signed)
Lab Results  Component Value Date   HGBA1C 5.5 04/03/2021   Advised to follow low carb diet Started ZOXWRU for weight loss

## 2022-10-16 NOTE — Assessment & Plan Note (Signed)
On Veozah, followed by OB/GYN

## 2022-10-16 NOTE — Assessment & Plan Note (Signed)

## 2022-10-16 NOTE — Assessment & Plan Note (Signed)
Check lipid profile Advised to follow low-carb and low cholesterol diet for now

## 2022-10-16 NOTE — Assessment & Plan Note (Signed)
Last vitamin D ?Lab Results  ?Component Value Date  ? VD25OH 22.2 (L) 04/03/2021  ? ?Advised to take vitamin D 5000 IU daily ?

## 2022-10-20 DIAGNOSIS — E782 Mixed hyperlipidemia: Secondary | ICD-10-CM | POA: Diagnosis not present

## 2022-10-20 DIAGNOSIS — E559 Vitamin D deficiency, unspecified: Secondary | ICD-10-CM | POA: Diagnosis not present

## 2022-10-20 DIAGNOSIS — R7303 Prediabetes: Secondary | ICD-10-CM | POA: Diagnosis not present

## 2022-10-21 ENCOUNTER — Other Ambulatory Visit: Payer: Self-pay | Admitting: Internal Medicine

## 2022-10-21 DIAGNOSIS — E782 Mixed hyperlipidemia: Secondary | ICD-10-CM

## 2022-10-21 MED ORDER — ROSUVASTATIN CALCIUM 10 MG PO TABS: 10.0000 mg | ORAL_TABLET | Freq: Every day | ORAL | 1 refills | Status: DC

## 2022-10-30 ENCOUNTER — Ambulatory Visit: Payer: Medicaid Other | Admitting: Adult Health

## 2022-10-30 ENCOUNTER — Encounter: Payer: Self-pay | Admitting: Adult Health

## 2022-10-30 ENCOUNTER — Telehealth: Payer: Self-pay | Admitting: *Deleted

## 2022-10-30 VITALS — BP 109/70 | HR 55 | Ht 62.0 in | Wt 266.0 lb

## 2022-10-30 DIAGNOSIS — R4586 Emotional lability: Secondary | ICD-10-CM | POA: Diagnosis not present

## 2022-10-30 DIAGNOSIS — R232 Flushing: Secondary | ICD-10-CM

## 2022-10-30 DIAGNOSIS — R61 Generalized hyperhidrosis: Secondary | ICD-10-CM | POA: Diagnosis not present

## 2022-10-30 MED ORDER — VEOZAH 45 MG PO TABS: 1.0000 | ORAL_TABLET | Freq: Every day | ORAL | 2 refills | Status: AC

## 2022-10-30 MED ORDER — BUSPIRONE HCL 5 MG PO TABS: 5.0000 mg | ORAL_TABLET | Freq: Three times a day (TID) | ORAL | 3 refills | Status: DC

## 2022-10-30 NOTE — Progress Notes (Signed)
Subjective:     Patient ID: Alexandra Garrett, female   DOB: [birth year], 37 y.o.   MRN: [id number]  HPI Alexandra Garrett is a 37 year old white female, married, G2P2002, back in follow up on taking veozah for hot flashes,night sweats and moody and it did help with hot flashes and night sweats but has not taken in few weeks drugs store did not have in stock yet. She says  very emotional is on Celexa 10 mg 1 daily from PCP.     Component Value Date/Time   DIAGPAP  05/15/2021 0914    - Negative for intraepithelial lesion or malignancy (NILM)   HPVHIGH Negative 05/15/2021 0914   ADEQPAP  05/15/2021 0914    Satisfactory for evaluation; transformation zone component PRESENT.    PCP is Dr Allena Katz.  Review of Systems Hot flashes and night sweats were better on Veozah, but off currently +emotional Reviewed past medical,surgical, social and family history. Reviewed medications and allergies.     Objective:   Physical Exam BP 109/70 (BP Location: Left Arm, Patient Position: Sitting, Cuff Size: Large)   Pulse (!) 55   Ht 5\' 2"  (1.575 m)   Wt 266 lb (120.7 kg)   BMI 48.65 kg/m     Skin warm and dry. Lungs: clear to ausculation bilaterally. Cardiovascular: regular rate and rhythm.     10/30/2022    8:37 AM 10/13/2022    2:28 PM 01/08/2022    9:48 AM  Depression screen PHQ 2/9  Decreased Interest 1 1 0  Down, Depressed, Hopeless 0 0 0  PHQ - 2 Score 1 1 0  Altered sleeping 1    Tired, decreased energy 1    Change in appetite 2    Feeling bad or failure about yourself  2    Trouble concentrating 0    Moving slowly or fidgety/restless 2    Suicidal thoughts 0    PHQ-9 Score 9         10/30/2022    8:39 AM 01/08/2022    9:48 AM 05/15/2021    9:28 AM 08/29/2020    3:16 PM  GAD 7 : Generalized Anxiety Score  Nervous, Anxious, on Edge 2 0 0 3  Control/stop worrying 2 0 0 3  Worry too much - different things 2 3 0 3  Trouble relaxing 2 1 0 3  Restless 2 0 0 3  Easily annoyed or irritable 2 0 0 3   Afraid - awful might happen 2 0 0 3  Total GAD 7 Score 14 4 0 21  Anxiety Difficulty  Somewhat difficult        Upstream - 10/30/22 0836       Pregnancy Intention Screening   Does the patient want to become pregnant in the next year? No    Does the patient's partner want to become pregnant in the next year? No    Would the patient like to discuss contraceptive options today? No      Contraception Wrap Up   Current Method IUD or IUS    End Method IUD or IUS    Contraception Counseling Provided Yes             Assessment:     1. Hot flashes Better on veozah, but currently off, due to [facility] not having, will send to another drug store, her husdnd uses [facility] and I gave her 21 sample tablets    2. Night sweats Better on veozah when was taking  3. Mood swings +emotional She is on celexa 10 mg 1 daily from PCP Will add buspar 5 mg 1 tid  Meds ordered this encounter  Medications   Fezolinetant (VEOZAH) 45 MG TABS    Sig: Take 1 tablet (45 mg total) by mouth daily.    Dispense:  30 tablet    Refill:  2    Order Specific Question:   Supervising Provider    Answer:   Despina Hidden, LUTHER H [[id number]]   busPIRone (BUSPAR) 5 MG tablet    Sig: Take 1 tablet (5 mg total) by mouth 3 (three) times daily.    Dispense:  90 tablet    Refill:  3    Order Specific Question:   Supervising Provider    Answer:   Lazaro Arms [[id number]]       Plan:     Follow up in 3 months

## 2022-10-30 NOTE — Telephone Encounter (Signed)
Pt's insurance has approved Veozah 45 mg with a $4.00 copay. Approved 10/30/22-10/30/23. Pt and [facility] in [place] aware. JSY

## 2022-12-02 ENCOUNTER — Ambulatory Visit: Payer: Medicaid Other | Admitting: Adult Health

## 2022-12-02 ENCOUNTER — Encounter: Payer: Self-pay | Admitting: Adult Health

## 2022-12-02 VITALS — BP 123/69 | HR 63 | Ht 62.0 in | Wt 268.0 lb

## 2022-12-02 DIAGNOSIS — R413 Other amnesia: Secondary | ICD-10-CM | POA: Diagnosis not present

## 2022-12-02 DIAGNOSIS — G44209 Tension-type headache, unspecified, not intractable: Secondary | ICD-10-CM

## 2022-12-02 DIAGNOSIS — R569 Unspecified convulsions: Secondary | ICD-10-CM

## 2022-12-02 DIAGNOSIS — G43709 Chronic migraine without aura, not intractable, without status migrainosus: Secondary | ICD-10-CM

## 2022-12-02 MED ORDER — LEVETIRACETAM ER 750 MG PO TB24: 1500.0000 mg | ORAL_TABLET | Freq: Every day | ORAL | 3 refills | Status: DC

## 2022-12-02 MED ORDER — TIZANIDINE HCL 4 MG PO TABS: 4.0000 mg | ORAL_TABLET | Freq: Every day | ORAL | 11 refills | Status: DC

## 2022-12-02 MED ORDER — AJOVY 225 MG/1.5ML ~~LOC~~ SOAJ: 225.0000 mg | SUBCUTANEOUS | 11 refills | Status: DC

## 2022-12-02 MED ORDER — TOPIRAMATE 50 MG PO TABS: 50.0000 mg | ORAL_TABLET | Freq: Two times a day (BID) | ORAL | 5 refills | Status: DC

## 2022-12-02 NOTE — Progress Notes (Signed)
Alexandra Garrett [address] [place]. [place] [address] [phone]       OFFICE FOLLOW UP NOTE  Alexandra Garrett Date of Birth:  04/20/1985 Medical Record Number:  [id number]   Referring MD: Alexandra Garrett Reason for Referral: Seizure, headaches, memory loss   Chief Complaint  Patient presents with   Follow-up    Patient in room #3 and alone. Patient states she hasn't had any seizure activties but she been having headaches.     HPI:   Update 12/02/2022 JM: Patient returns for follow-up visit.  Migraines remain well-controlled on Ajovy and topiramate, reports 2-3 mild migraines per month. Currently on topiramate 50mg  BID, decreased at prior visit as migraines well controlled on Ajovy. Denies any worsening on lower dosage. Occasionally uses tizanidine with more severe migraines with benefit.  Denies any seizure activity on Keppra XR 1500mg  nightly, tolerating without side effects  Cognition has been overall stable, can fluctuate at times. Working on managing anxiety/depression, OBGYN recently added buspirone for anxiety which has been helpful, also on Celexa. Also notes fatigue, noted low Vit D level, currently on supplement.       History provided for reference purposes only Update 05/28/2022 JM: Patient returns for migraine and seizure follow-up.  At prior visit, she was started on Ajovy for migraine prevention and continued on topiramate and started tizanidine and Zofran as needed.  She was also switched from Keppra IR to Keppra XR as difficulty remembering twice daily dosing.  Reports significant improvement of migraines since starting on Ajovy.  She has only had 2-3 migraines over the past 5 months.  Also continues on topiramate 100 mg twice daily.  Will take tizanidine with migraine onset with benefit.  Denies any seizure activity since prior visit.  Feels like she is doing better on Keppra XR than IR formulation.   Memory difficulties stable since  prior visit  Update 12/26/2021 JM: Patient returns for follow-up visit regarding history of migraine headaches and seizures.  She is accompanied by her husband.  She reports continued mixed migrainous and tension type headaches.  She has been experiencing almost daily tension type headaches and about 16 migrainous days per month.  She did not start Emgality as previously ordered, reports her pharmacy told her insurance did not approve but no information about this in epic.  She reports PCP discontinued propranolol back in April and has since been experiencing worsening headaches but this had been previously discontinued by cardiology due to bradycardia which was fully discussed at prior visit as well.  She has remained on topiramate 100 mg twice daily, tolerating without side effects. She did not trial use of tizanidine for severe headaches/migraines as she was not aware of this prescription.  She can have nausea with severe migraines.  Remains on Keppra 750 mg twice daily, reports typical seizure back in March, does admit to significantly increased stress at that time.  Denies missing any medication dosage.  She has not had any additional events since that time.  She questions Keppra XR formulation due to ease of only taking once daily.  She continues to experience memory loss but this has been stable since prior visit.  She believes her depression/anxiety are under fairly good control on citalopram, she can have occasional increased anxiety at times.  This is currently being managed by her PCP.   Update 03/26/2021 JM: Returns for routine follow-up after prior visit 8 months ago for headaches, seizures and memory loss concerns.  Pt eval by  Dr. Frances Garrett for suspected sleep apnea in 09/2020 with completion of sleep study 12/2020 which did not show evidence of sleep apnea. Upon call for these results, she disucssed with RN headaches worsening and restarted propranolol on her own despite this previously being  discontinued by cardiology due to bradycardia - recommended increasing topiramate dosage from 50mg  BID to 50mg  AM and 100mg  PM and stop propranolol per cardiology recommendations.  Tolerating current dose of topiramate with some benefit after increasing but still c/o daily tension type headaches still bitemporal and occasionally occipital. She will also have approx 15 worsening headaches per month which seem to be more migrainous type.   Has remained on keppra 750 mg twice daily without side effects and no seizure activity  Short-term memory loss improved since prior visit - has been doing compensation strategies to further help Referred to [facility] but per patient, never called - per referral review, it was denied as [facility] not accepting new referrals. Still having issues with depression/anxiety which can trigger worsening headaches/migraines   No further concerns at this time  Update 07/18/2020 JM: Mrs. Alexandra Garrett returns for sooner visit to review recent test results and continued memory concerns and headaches.  She is accompanied by her husband.  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  She is also been experiencing frequent headaches located bitemporally with constant pressure which can fluctuate throughout the day at times associated with photophobia and phonophobia.  Denies any visual changes.  At times memory can be worse with worsening headache.  She does admit to greatly increased stress levels as well as underlying untreated anxiety.  She will take  Aleve frequently throughout the day without much benefit.  Reports sleeping well at night but does have daytime fatigue and husband reports snoring and witnessed apnea.  Thankfully, she has not had any additional seizures that she is aware of and has been tolerating Keppra 750 mg twice daily without side effects.  No further concerns at this time  Consult visit 06/14/2020 Dr. Pearlean Brownie: Ms. Cornelious is a 37 year old Caucasian lady seen today for initial office consultation visit for seizures.  History is obtained from the patient, review of electronic medical records and I personally reviewed available imaging films in PACS.  Patient has past medical history of anemia, seizures.  Her seizures began at approximately age 57 and continued for around 10 years and stopped at age 63.  She was seen by Dr. Sharene Skeans in was on Tegretol which worked quite well.  Seizures were described as being generalized tonic-clonic with tongue bite and injury.  Tegretol was tapered off and she did well and remained seizure-free for for almost 10 years and started having nocturnal seizures from age 46.  The patient states she knows she is at a seizure because she wakes up with a bad headache and occasionally has tongue bites as well as feels muscles are sore and she is tired for the next day.  She was seen in the emergency room at [facility] where she lived for several times for the seizures.  She has moved to [place]  7 years ago and has seizures around 102/year.  She is has been seen Dr. Gerilyn Pilgrim.  He did a polysomnogram which was apparently unremarkable.  He started her on Keppra 500 mg twice daily which she is tolerating well without any side effects.  Her last seizure was 2 3 weeks ago and the EMS were called but by the time they arrived patient was recovering back to baseline and refused to go to the ER.  She states she is been compliant with her Keppra and has not missed a single dose.  She states she has been starting having headaches  after seizures and at times even memory loss.  Seizures often triggered by stress and lack of sleep.  She had a Mini-Mental status exam done today and scored   26 out of 30 with deficits in orientation and attention calculation and recall.  She is worried that her memory loss after seizure is a new finding.  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  I was unable to get records from Dr. Gerilyn Pilgrim, Dr. Sharene Skeans or any prior EEG studies for my review today    ROS:   14 system review of systems is positive for those listed in HPI and all other systems negative   PMH:  Past Medical History:  Diagnosis Date   Anemia    Headache    High cholesterol    History of medication noncompliance    Seizures (HCC)    Since age 60 yrs    Social History:  Social History   Socioeconomic History   Marital status: Married    Spouse name: Alexandra Garrett   Number of children: 2   Years of education: some college   Highest education level: Not on file  Occupational History   Not on file  Tobacco Use   Smoking status: Never   Smokeless tobacco: Never  Vaping Use   Vaping status: Never Used  Substance and Sexual Activity   Alcohol use: No    Alcohol/week: 0.0 standard drinks of alcohol   Drug use: No   Sexual activity: Yes    Birth control/protection: I.U.D.  Other Topics Concern   Not on file  Social History Narrative   Right handed   Soda: 2-6 per day   Works at [employer].   Married April 2020,second marriage.Lives with husband and 2 kids.      Social Determinants of Health   Financial Resource Strain: Low Risk  (05/15/2021)   Overall Financial Resource Strain (CARDIA)    Difficulty of Paying Living Expenses: Not very hard  Food Insecurity: No Food Insecurity (05/15/2021)   Hunger Vital Sign    Worried About Running Out of Food in the Last Year: Never true    Ran Out of Food  in the Last Year: Never true  Transportation Needs: No Transportation Needs (05/15/2021)   PRAPARE - Administrator, Civil Service (Medical): No    Lack of Transportation (Non-Medical): No  Physical Activity: Insufficiently Active (05/15/2021)   Exercise Vital Sign    Days of Exercise per Week: 5 days    Minutes of Exercise per Session: 20 min  Stress: Stress Concern Present (05/15/2021)   [employer] of Occupational Health - Occupational Stress Questionnaire    Feeling of Stress : To some extent  Social Connections: Socially Integrated (05/15/2021)   Social Connection and Isolation Panel [NHANES]  Frequency of Communication with Friends and Family: More than three times a week    Frequency of Social Gatherings with Friends and Family: Once a week    Attends Religious Services: More than 4 times per year    Active Member of [employer] or Organizations: Yes    Attends Engineer, structural: More than 4 times per year    Marital Status: Married  Catering manager Violence: Not At Risk (05/15/2021)   Humiliation, Afraid, Rape, and Kick questionnaire    Fear of Current or Ex-Partner: No    Emotionally Abused: No    Physically Abused: No    Sexually Abused: No    Medications:   Current Outpatient Medications on File Prior to Visit  Medication Sig Dispense Refill   busPIRone (BUSPAR) 5 MG tablet Take 1 tablet (5 mg total) by mouth 3 (three) times daily. 90 tablet 3   Cholecalciferol (VITAMIN D3) 25 MCG (1000 UT) CAPS Take 1 capsule by mouth daily.     citalopram (CELEXA) 10 MG tablet TAKE 1 TABLET(10 MG) BY MOUTH DAILY 90 tablet 1   Fezolinetant (VEOZAH) 45 MG TABS Take 1 tablet (45 mg total) by mouth daily. 30 tablet 2   Fremanezumab-vfrm (AJOVY) 225 MG/1.5ML SOAJ Inject 225 mg into the skin every 30 (thirty) days. 1.68 mL 11   hydrOXYzine (VISTARIL) 25 MG capsule Take 1 capsule (25 mg total) by mouth every 8 (eight) hours as needed for anxiety. 30 capsule 1   levETIRAcetam  (KEPPRA XR) 750 MG 24 hr tablet Take 2 tablets (1,500 mg total) by mouth daily. 60 tablet 11   levonorgestrel (MIRENA) 20 MCG/24HR IUD 1 each by Intrauterine route once.     ondansetron (ZOFRAN-ODT) 4 MG disintegrating tablet Take 1 tablet (4 mg total) by mouth every 8 (eight) hours as needed for nausea or vomiting. 20 tablet 11   rosuvastatin (CRESTOR) 10 MG tablet Take 1 tablet (10 mg total) by mouth daily. 90 tablet 1   tiZANidine (ZANAFLEX) 4 MG tablet Take 1 tablet (4 mg total) by mouth at bedtime. 15 tablet 11   topiramate (TOPAMAX) 50 MG tablet Take 1 tablet (50 mg total) by mouth in the morning. (Patient taking differently: Take 50 mg by mouth 2 (two) times daily.) 30 tablet 5   Semaglutide-Weight Management 0.5 MG/0.5ML SOAJ Inject 0.5 mg into the skin once a week for 28 days. (Patient not taking: Reported on 10/30/2022) 2 mL 0   No current facility-administered medications on file prior to visit.    Allergies:  No Known Allergies  Physical Exam Today's Vitals   12/02/22 1045  BP: 123/69  Pulse: 63  Weight: 268 lb (121.6 kg)  Height: 5\' 2"  (1.575 m)   Body mass index is 49.02 kg/m.  General: well developed, well nourished, very pleasant middle-age Caucasian female, seated, in no evident distress Head: head normocephalic and atraumatic.   Neck: supple with no carotid or supraclavicular bruits Cardiovascular: regular rate and rhythm, no murmurs Musculoskeletal: no deformity Skin:  no rash/petichiae Vascular:  Normal pulses all extremities   Neurologic Exam Mental Status: Awake and fully alert. Oriented to place and time. Recent and remote memory intact. Attention span, concentration and fund of knowledge appropriate. Mood and affect appropriate.  Cranial Nerves: Pupils equal, briskly reactive to light. Extraocular movements full without nystagmus. Visual fields full to confrontation. Hearing intact. Facial sensation intact. Face, tongue, palate moves normally and  symmetrically.  Motor: Normal bulk and tone. Normal strength in all tested  extremity muscles Sensory.: intact to touch , pinprick , position and vibratory sensation.  Coordination: Rapid alternating movements normal in all extremities. Finger-to-nose and heel-to-shin performed accurately bilaterally. Gait and Station: Arises from chair without difficulty. Stance is normal. Gait demonstrates normal stride length and balance without use of AD.  Reflexes: 1+ and symmetric. Toes downgoing.       ASSESSMENT: 37 year old Caucasian lady with with childhood history of generalized epilepsy which she had outgrown but started having nocturnal seizures around 2010.  Also complains of short-term memory concerns and frequent headaches. Sleep study 12/2020 negative for sleep apnea     PLAN:  Nocturnal seizures -No recent seizure activity, prior seizure in 05/2021 likely provoked in setting of extreme stress -continue Keppra XR 1500 mg nightly - refill provided  -She was advised to call office with any additional seizure activity.  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 If migraines worsen, advised to return to prior dosage.  -Continue tizanidine 4 mg nightly for severe migraine headache -Use of Zofran as needed for nausea associated with severe migraine headache   Short-term memory loss -Overall stable,  nonprogressive, can fluctuate at time -No clear cause, suspect multifactoral -Discussed importance of depression/anxiety management as well as stress management     Follow-up in 1 year or call earlier if needed     I spent 25 minutes of face-to-face and non-face-to-face time with patient.  This included previsit chart review, lab review, study review, order entry, electronic health record documentation, patient education and discussion regarding above diagnoses, further treatment options and education and answered all other questions to patients satisfaction  Ihor Austin, [facility]  [facility] [address] [place], [place] [address]  Phone [phone] Fax [phone] Note: This document was prepared with digital dictation and possible smart phrase technology. Any transcriptional errors that result from this process are unintentional.

## 2022-12-02 NOTE — Progress Notes (Deleted)
[facility] [address] [place]. [place] [address] [phone]       OFFICE FOLLOW UP NOTE  [name] Date of Birth:  [birth year] Medical Record Number:  [id number]   Referring MD: Lilly Cove Reason for Referral: Seizure, headaches, memory loss     HPI:   Update 12/02/2022 JM: Patient returns for follow-up visit.  Migraines remain well-controlled on Ajovy and topiramate 50/100 (decreased at prior visit), reports *** migraines per month.  Use of tizanidine ***  Denies any seizure activity on Keppra 1500mg  nightly, tolerating without side effects      History provided for reference purposes only Update 05/28/2022 JM: Patient returns for migraine and seizure follow-up.  At prior visit, she was started on Ajovy for migraine prevention and continued on topiramate and started tizanidine and Zofran as needed.  She was also switched from Keppra IR to Keppra XR as difficulty remembering twice daily dosing.  Reports significant improvement of migraines since starting on Ajovy.  She has only had 2-3 migraines over the past 5 months.  Also continues on topiramate 100 mg twice daily.  Will take tizanidine with migraine onset with benefit.  Denies any seizure activity since prior visit.  Feels like she is doing better on Keppra XR than IR formulation.   Memory difficulties stable since prior visit  Update 12/26/2021 JM: Patient returns for follow-up visit regarding history of migraine headaches and seizures.  She is accompanied by her husband.  She reports continued mixed migrainous and tension type headaches.  She has been experiencing almost daily tension type headaches and about 16 migrainous days per month.  She did not start Emgality as previously ordered, reports her pharmacy told her insurance did not approve but no information about this in epic.  She reports PCP discontinued propranolol back in April and has since been experiencing worsening  headaches but this had been previously discontinued by cardiology due to bradycardia which was fully discussed at prior visit as well.  She has remained on topiramate 100 mg twice daily, tolerating without side effects. She did not trial use of tizanidine for severe headaches/migraines as she was not aware of this prescription.  She can have nausea with severe migraines.  Remains on Keppra 750 mg twice daily, reports typical seizure back in March, does admit to significantly increased stress at that time.  Denies missing any medication dosage.  She has not had any additional events since that time.  She questions Keppra XR formulation due to ease of only taking once daily.  She continues to experience memory loss but this has been stable since prior visit.  She believes her depression/anxiety are under fairly good control on citalopram, she can have occasional increased anxiety at times.  This is currently being managed by her PCP.   Update 03/26/2021 JM: Returns for routine follow-up after prior visit 8 months ago for headaches, seizures and memory loss concerns.  Pt eval by Dr. Frances Furbish for suspected sleep apnea in 09/2020 with completion of sleep study 12/2020 which did not show evidence of sleep apnea. Upon call for these results, she disucssed with RN headaches worsening and restarted propranolol on her own despite this previously being discontinued by cardiology due to bradycardia - recommended increasing topiramate dosage from 50mg  BID to 50mg  AM and 100mg  PM and stop propranolol per cardiology recommendations.  Tolerating current dose of topiramate with some benefit after increasing but still c/o daily tension type headaches still bitemporal and occasionally occipital. She will  also have approx 15 worsening headaches per month which seem to be more migrainous type.   Has remained on keppra 750 mg twice daily without side effects and no seizure activity  Short-term memory loss improved since prior  visit - has been doing compensation strategies to further help Referred to [facility] but per patient, never called - per referral review, it was denied as [facility] not accepting new referrals. Still having issues with depression/anxiety which can trigger worsening headaches/migraines   No further concerns at this time  Update 07/18/2020 JM: Alexandra Garrett returns for sooner visit to review recent test results and continued memory concerns and headaches.  She is accompanied by her husband.  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  She is also been experiencing frequent headaches located bitemporally with constant pressure which can fluctuate throughout the day at times associated with photophobia and phonophobia.  Denies any visual changes.  At times memory can be worse with worsening headache.  She does admit to greatly increased stress levels as well as underlying untreated anxiety.  She will take Aleve frequently throughout the day without much benefit.  Reports sleeping well at night but does have daytime fatigue and husband reports snoring and witnessed apnea.  Thankfully, she has not had any additional seizures that she is aware of and has been tolerating Keppra 750 mg twice daily without side effects.  No further concerns at this time  Consult visit 06/14/2020 Dr. Pearlean Brownie: Alexandra Garrett is a 37 year old Caucasian lady seen today for initial office consultation visit for seizures.  History is obtained from the patient, review of electronic medical records and I  personally reviewed available imaging films in PACS.  Patient has past medical history of anemia, seizures.  Her seizures began at approximately age 72 and continued for around 10 years and stopped at age 60.  She was seen by Dr. Sharene Skeans in was on Tegretol which worked quite well.  Seizures were described as being generalized tonic-clonic with tongue bite and injury.  Tegretol was tapered off and she did well and remained seizure-free for for almost 10 years and started having nocturnal seizures from age 65.  The patient states she knows she is at a seizure because she wakes up with a bad headache and occasionally has tongue bites as well as feels muscles are sore and she is tired for the next day.  She was seen in the emergency room at [facility] where she lived for several times for the seizures.  She has moved to [place] 7 years ago and has seizures around 102/year.  She is has been seen Dr. Gerilyn Pilgrim.  He did a polysomnogram which was apparently unremarkable.  He started her on Keppra 500 mg twice daily which she is tolerating well without any side effects.  Her last seizure was 2 3 weeks ago and the EMS were called but by the time they arrived patient was recovering back to baseline and refused to go to the ER.  She states she is been compliant with her Keppra and has not missed a single dose.  She states she has been starting having headaches after seizures and at times even memory loss.  Seizures often triggered by stress and lack of sleep.  She had a Mini-Mental status exam done today and scored   26 out of 30 with deficits in orientation and attention calculation and recall.  She is worried that her memory loss after seizure is a new finding.  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  I was unable to get records from Dr. Gerilyn Pilgrim, Dr. Sharene Skeans or any prior EEG studies for my review  today    ROS:   14 system review of systems is positive for those listed in HPI and all other systems negative   PMH:  Past Medical History:  Diagnosis Date   Anemia    Headache    High cholesterol    History of medication noncompliance    Seizures (HCC)    Since age 35 yrs    Social History:  Social History   Socioeconomic History   Marital status: Married    Spouse name: Fayrene Fearing   Number of children: 2   Years of education: some college   Highest education level: Not on file  Occupational History   Not on file  Tobacco Use   Smoking status: Never   Smokeless tobacco: Never  Vaping Use   Vaping status: Never Used  Substance and Sexual Activity   Alcohol use: No    Alcohol/week: 0.0 standard drinks of alcohol   Drug use: No   Sexual activity: Yes    Birth control/protection: I.U.D.  Other Topics Concern   Not on file  Social History Narrative   Right handed   Soda: 2-6 per day   Works at [employer].   Married April 2020,second marriage.Lives with husband and 2 kids.      Social Determinants of Health   Financial Resource Strain: Low Risk  (05/15/2021)   Overall Financial Resource Strain (CARDIA)    Difficulty of Paying Living Expenses: Not very hard  Food Insecurity: No Food Insecurity (05/15/2021)   Hunger Vital Sign    Worried About Running Out of Food in the Last Year: Never true    Ran Out of Food in the Last Year: Never true  Transportation Needs: No Transportation Needs (05/15/2021)   PRAPARE - Administrator, Civil Service (Medical): No    Lack of Transportation (Non-Medical): No  Physical Activity: Insufficiently Active (05/15/2021)   Exercise Vital Sign    Days of Exercise per Week: 5 days    Minutes of Exercise per Session: 20 min  Stress: Stress Concern Present (05/15/2021)   [employer] of Occupational Health - Occupational Stress Questionnaire    Feeling of Stress : To some extent  Social Connections: Socially Integrated  (05/15/2021)   Social Connection and Isolation Panel [NHANES]    Frequency of Communication with Friends and Family: More than three times a week    Frequency of Social Gatherings with Friends and Family: Once a week    Attends Religious Services: More than 4 times per year    Active Member of [employer] or Organizations: Yes    Attends Banker Meetings: More than 4 times per year    Marital Status: Married  Catering manager Violence: Not At Risk (05/15/2021)   Humiliation, Afraid, Rape, and Kick questionnaire    Fear of Current or  Ex-Partner: No    Emotionally Abused: No    Physically Abused: No    Sexually Abused: No    Medications:   Current Outpatient Medications on File Prior to Visit  Medication Sig Dispense Refill   busPIRone (BUSPAR) 5 MG tablet Take 1 tablet (5 mg total) by mouth 3 (three) times daily. 90 tablet 3   Cholecalciferol (VITAMIN D3) 25 MCG (1000 UT) CAPS Take 1 capsule by mouth daily.     citalopram (CELEXA) 10 MG tablet TAKE 1 TABLET(10 MG) BY MOUTH DAILY 90 tablet 1   Fezolinetant (VEOZAH) 45 MG TABS Take 1 tablet (45 mg total) by mouth daily. 30 tablet 2   Fremanezumab-vfrm (AJOVY) 225 MG/1.5ML SOAJ Inject 225 mg into the skin every 30 (thirty) days. 1.68 mL 11   hydrOXYzine (VISTARIL) 25 MG capsule Take 1 capsule (25 mg total) by mouth every 8 (eight) hours as needed for anxiety. 30 capsule 1   levETIRAcetam (KEPPRA XR) 750 MG 24 hr tablet Take 2 tablets (1,500 mg total) by mouth daily. 60 tablet 11   levonorgestrel (MIRENA) 20 MCG/24HR IUD 1 each by Intrauterine route once.     ondansetron (ZOFRAN-ODT) 4 MG disintegrating tablet Take 1 tablet (4 mg total) by mouth every 8 (eight) hours as needed for nausea or vomiting. 20 tablet 11   rosuvastatin (CRESTOR) 10 MG tablet Take 1 tablet (10 mg total) by mouth daily. 90 tablet 1   Semaglutide-Weight Management 0.5 MG/0.5ML SOAJ Inject 0.5 mg into the skin once a week for 28 days. (Patient not taking: Reported on  10/30/2022) 2 mL 0   tiZANidine (ZANAFLEX) 4 MG tablet Take 1 tablet (4 mg total) by mouth at bedtime. 15 tablet 11   topiramate (TOPAMAX) 50 MG tablet Take 1 tablet (50 mg total) by mouth in the morning. (Patient taking differently: Take 50 mg by mouth 2 (two) times daily.) 30 tablet 5   No current facility-administered medications on file prior to visit.    Allergies:  No Known Allergies  Physical Exam N/A d/t visit type       ASSESSMENT: 37 year old Caucasian lady with with childhood history of generalized epilepsy which she had outgrown but started having nocturnal seizures around 2010.  Also complains of short-term memory concerns and frequent headaches. Sleep study 12/2020 negative for sleep apnea     PLAN:  Nocturnal seizures -Reports typical seizure in 05/2021 -Possibly provoked in setting of extreme stress -As no additional seizures since that time, continue Keppra XR 1500 mg nightly -She was advised to call office with any additional seizure activity.  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  -Recommend gradually decreasing topiramate, will decrease to 50mg  AM and 100mg  PM. Can discuss further decreasing at prior visit if remains stable. Advised to go back to 100mg  BID dosing if headaches worsen with lower dosage -Continue tizanidine 4 mg nightly for severe migraine headache -Use of Zofran as needed for nausea associated with severe migraine  headache   Short-term memory loss -Overall stable, nonprogressive -No clear cause, suspect multifactoral -Discussed  importance of depression/anxiety management as well as stress management      Follow-up in 6 months or call earlier if needed     I spent 16 minutes of face-to-face and non-face-to-face time with patient via MyChart video visit.  This included previsit chart review, lab review, study review, order entry, electronic health record documentation, patient education and discussion regarding above diagnoses, further treatment options and education and answered all other questions to patients satisfaction   Ihor Austin, [facility]  [facility] [address] [place], [place] [address]  Phone [phone] Fax [phone] Note: This document was prepared with digital dictation and possible smart phrase technology. Any transcriptional errors that result from this process are unintentional.

## 2022-12-02 NOTE — Patient Instructions (Signed)
Decrease topamax to taking 1 50mg  tablet at night, if migraines worsen please return to taking 50mg  twice daily. If doing well after 1 month, can try to completely stop topamax.   Continue Ajovy monthly injection for migraine prevention  Continue tizanidine as needed for migraines  Continue keppra XR 1500mg  nightly for seizure prevention      Followup in the future with me in 1 year or call earlier if needed       Thank you for coming to see [place] at [facility]. I hope we have been able to provide you high quality care today.  You may receive a patient satisfaction survey over the next few weeks. We would appreciate your feedback and comments so that we may continue to improve ourselves and the health of our patients.

## 2022-12-29 ENCOUNTER — Other Ambulatory Visit: Payer: Self-pay

## 2022-12-29 ENCOUNTER — Telehealth: Payer: Self-pay | Admitting: Internal Medicine

## 2022-12-29 MED ORDER — SEMAGLUTIDE-WEIGHT MANAGEMENT 0.5 MG/0.5ML ~~LOC~~ SOAJ: 0.5000 mg | SUBCUTANEOUS | 0 refills | Status: DC

## 2022-12-29 NOTE — Telephone Encounter (Signed)
Came by office in regard to Semaglutide-Weight Management 0.5 MG/0.5ML   Med is on back order with [facility] ,  Wants med sent to [facility]

## 2022-12-29 NOTE — Telephone Encounter (Signed)
Refills sent to [facility]  

## 2023-01-08 ENCOUNTER — Other Ambulatory Visit: Payer: Self-pay | Admitting: Adult Health

## 2023-01-13 ENCOUNTER — Ambulatory Visit: Payer: Medicaid Other | Admitting: Internal Medicine

## 2023-01-21 ENCOUNTER — Encounter: Payer: Self-pay | Admitting: Internal Medicine

## 2023-01-22 ENCOUNTER — Ambulatory Visit: Payer: Medicaid Other | Admitting: Internal Medicine

## 2023-01-22 ENCOUNTER — Other Ambulatory Visit (HOSPITAL_COMMUNITY): Payer: Self-pay

## 2023-01-22 ENCOUNTER — Encounter: Payer: Self-pay | Admitting: Internal Medicine

## 2023-01-22 DIAGNOSIS — G5623 Lesion of ulnar nerve, bilateral upper limbs: Secondary | ICD-10-CM | POA: Insufficient documentation

## 2023-01-22 DIAGNOSIS — Z23 Encounter for immunization: Secondary | ICD-10-CM | POA: Diagnosis not present

## 2023-01-22 DIAGNOSIS — Z6841 Body Mass Index (BMI) 40.0 and over, adult: Secondary | ICD-10-CM

## 2023-01-22 MED ORDER — WEGOVY 0.5 MG/0.5ML ~~LOC~~ SOAJ
0.5000 mg | SUBCUTANEOUS | 0 refills | Status: DC
  Filled 2023-01-22: qty 2, 28d supply, fill #0
  Filled 2023-03-19: qty 2, fill #0
  Filled 2023-04-20 (×2): qty 2, 28d supply, fill #0

## 2023-01-22 MED ORDER — WEGOVY 0.25 MG/0.5ML ~~LOC~~ SOAJ
0.2500 mg | SUBCUTANEOUS | 0 refills | Status: DC
Start: 2023-01-22 — End: 2023-03-06
  Filled 2023-01-22 (×2): qty 2, 28d supply, fill #0

## 2023-01-22 NOTE — Assessment & Plan Note (Signed)
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

## 2023-01-22 NOTE — Progress Notes (Signed)
Established Patient Office Visit  Subjective:  Patient ID: Alexandra Garrett, female    DOB: 12-15-1985  Age: 37 y.o. MRN: [id number]  CC:  Chief Complaint  Patient presents with   Weight Loss    Three month follow up     HPI Alexandra Garrett is a 37 y.o. female with past medical history of seizure disorder, migraine and GAD who presents for f/u of her chronic medical conditions.  She follows up with neurology for history of seizure disorder and migraine.  She is on Keppra for seizures.  She has not had any seizures for the last 2 years.  She was started on Ajovy for migraine, which has improved migraine frequency and intensity.. She is also on Topamax for it.    Her anxiety is well controlled with Celexa now.  She denies any episode of panic, but has spells of stress/anxiety at times. Denies any anhedonia, SI or HI.  Obesity: She has been trying to follow low-carb diet, but is having difficulty losing weight.  She is trying to cut down soft drink intake.  She has gained about 13 lbs since the last visit. After lengthy discussion about medical weight loss options, she was prescribed Wegovy in the last visit, but has not had it yet due to short supply at her pharmacy.  She is motivated to follow low-carb diet and perform regular exercise.  She also reports numbness and tingling of the fourth and fifth digits bilaterally.  She has also experienced weakness of the hand, up to the point where she has had difficulty holding objects.  Denies any recent injury.  She has not had numbness of the medial side of the forearm at times as well.  Past Medical History:  Diagnosis Date   Anemia    Headache    High cholesterol    History of medication noncompliance    Seizures (HCC)    Since age 45 yrs    Past Surgical History:  Procedure Laterality Date   NO PAST SURGERIES      Family History  Problem Relation Age of Onset   Arthritis Mother    Depression Mother    Hypertension Mother     Breast cancer Mother        lymph nodes   Heart disease Father    Arthritis Maternal Grandmother    Cancer Maternal Grandmother        breast   Diabetes Maternal Grandmother    Cancer Maternal Grandfather        lung   Stroke Paternal Grandmother    Heart disease Paternal Grandfather     Social History   Socioeconomic History   Marital status: Married    Spouse name: Fayrene Fearing   Number of children: 2   Years of education: some college   Highest education level: Not on file  Occupational History   Not on file  Tobacco Use   Smoking status: Never   Smokeless tobacco: Never  Vaping Use   Vaping status: Never Used  Substance and Sexual Activity   Alcohol use: No    Alcohol/week: 0.0 standard drinks of alcohol   Drug use: No   Sexual activity: Yes    Birth control/protection: I.U.D.  Other Topics Concern   Not on file  Social History Narrative   Right handed   Soda: 2-6 per day   Works at [employer].   Married April 2020,second marriage.Lives with husband and 2 kids.      Social  Determinants of Health   Financial Resource Strain: Low Risk  (05/15/2021)   Overall Financial Resource Strain (CARDIA)    Difficulty of Paying Living Expenses: Not very hard  Food Insecurity: No Food Insecurity (05/15/2021)   Hunger Vital Sign    Worried About Running Out of Food in the Last Year: Never true    Ran Out of Food in the Last Year: Never true  Transportation Needs: No Transportation Needs (05/15/2021)   PRAPARE - Administrator, Civil Service (Medical): No    Lack of Transportation (Non-Medical): No  Physical Activity: Insufficiently Active (05/15/2021)   Exercise Vital Sign    Days of Exercise per Week: 5 days    Minutes of Exercise per Session: 20 min  Stress: Stress Concern Present (05/15/2021)   [employer] of Occupational Health - Occupational Stress Questionnaire    Feeling of Stress : To some extent  Social Connections: Socially Integrated (05/15/2021)    Social Connection and Isolation Panel [NHANES]    Frequency of Communication with Friends and Family: More than three times a week    Frequency of Social Gatherings with Friends and Family: Once a week    Attends Religious Services: More than 4 times per year    Active Member of [employer] or Organizations: Yes    Attends Engineer, structural: More than 4 times per year    Marital Status: Married  Catering manager Violence: Not At Risk (05/15/2021)   Humiliation, Afraid, Rape, and Kick questionnaire    Fear of Current or Ex-Partner: No    Emotionally Abused: No    Physically Abused: No    Sexually Abused: No    Outpatient Medications Prior to Visit  Medication Sig Dispense Refill   busPIRone (BUSPAR) 5 MG tablet Take 1 tablet (5 mg total) by mouth 3 (three) times daily. 90 tablet 3   Cholecalciferol (VITAMIN D3) 25 MCG (1000 UT) CAPS Take 1 capsule by mouth daily.     citalopram (CELEXA) 10 MG tablet TAKE 1 TABLET(10 MG) BY MOUTH DAILY 90 tablet 1   Fezolinetant (VEOZAH) 45 MG TABS Take 1 tablet (45 mg total) by mouth daily. 30 tablet 2   Fremanezumab-vfrm (AJOVY) 225 MG/1.5ML SOAJ Inject 225 mg into the skin every 30 (thirty) days. 1.68 mL 11   hydrOXYzine (VISTARIL) 25 MG capsule Take 1 capsule (25 mg total) by mouth every 8 (eight) hours as needed for anxiety. 30 capsule 1   levETIRAcetam (KEPPRA XR) 750 MG 24 hr tablet Take 2 tablets (1,500 mg total) by mouth daily. 180 tablet 3   levonorgestrel (MIRENA) 20 MCG/24HR IUD 1 each by Intrauterine route once.     ondansetron (ZOFRAN-ODT) 4 MG disintegrating tablet Take 1 tablet (4 mg total) by mouth every 8 (eight) hours as needed for nausea or vomiting. 20 tablet 11   rosuvastatin (CRESTOR) 10 MG tablet Take 1 tablet (10 mg total) by mouth daily. 90 tablet 1   tiZANidine (ZANAFLEX) 4 MG tablet Take 1 tablet (4 mg total) by mouth at bedtime. 15 tablet 11   topiramate (TOPAMAX) 50 MG tablet Take 1 tablet (50 mg total) by mouth in the  morning and at bedtime. 60 tablet 5   Semaglutide-Weight Management 0.5 MG/0.5ML SOAJ Inject 0.5 mg into the skin once a week for 28 days. 2 mL 0   No facility-administered medications prior to visit.    No Known Allergies  ROS Review of Systems  Constitutional:  Negative for chills and fever.  HENT:  Negative for congestion, sinus pressure, sinus pain and sore throat.   Eyes:  Negative for pain and discharge.  Respiratory:  Negative for cough and shortness of breath.   Cardiovascular:  Negative for chest pain and palpitations.  Gastrointestinal:  Negative for abdominal pain, diarrhea, nausea and vomiting.  Endocrine: Negative for polydipsia and polyuria.  Genitourinary:  Negative for dysuria and hematuria.  Musculoskeletal:  Negative for neck pain and neck stiffness.  Skin:  Negative for rash.  Neurological:  Positive for weakness, numbness (B/l hands) and headaches. Negative for dizziness.  Psychiatric/Behavioral:  Positive for sleep disturbance. Negative for agitation and behavioral problems. The patient is not nervous/anxious.       Objective:    Physical Exam Vitals reviewed.  Constitutional:      General: She is not in acute distress.    Appearance: She is obese. She is not diaphoretic.  HENT:     Head: Normocephalic and atraumatic.     Nose: Nose normal.     Mouth/Throat:     Mouth: Mucous membranes are moist.  Eyes:     General: No scleral icterus.    Extraocular Movements: Extraocular movements intact.  Cardiovascular:     Rate and Rhythm: Normal rate and regular rhythm.     Heart sounds: Normal heart sounds. No murmur heard. Pulmonary:     Breath sounds: Normal breath sounds. No wheezing or rales.  Musculoskeletal:     Cervical back: Neck supple. No tenderness.     Right lower leg: No edema.     Left lower leg: No edema.  Skin:    General: Skin is warm.     Findings: No rash.  Neurological:     General: No focal deficit present.     Mental Status: She  is alert and oriented to person, place, and time.     Cranial Nerves: No cranial nerve deficit.     Sensory: Sensory deficit (B/l hands - 4th and 5th digits) present.     Motor: No weakness.  Psychiatric:        Mood and Affect: Mood normal.        Behavior: Behavior normal.     BP 111/75 (BP Location: Left Arm, Patient Position: Sitting, Cuff Size: Large)   Pulse 88   Ht 5\' 2"  (1.575 m)   Wt 276 lb 12.8 oz (125.6 kg)   SpO2 98%   BMI 50.63 kg/m  Wt Readings from Last 3 Encounters:  01/22/23 276 lb 12.8 oz (125.6 kg)  12/02/22 268 lb (121.6 kg)  10/30/22 266 lb (120.7 kg)    Lab Results  Component Value Date   TSH 2.310 09/25/2022   Lab Results  Component Value Date   WBC 11.8 (H) 09/25/2022   HGB 14.9 09/25/2022   HCT 45.8 09/25/2022   MCV 89 09/25/2022   PLT 364 09/25/2022   Lab Results  Component Value Date   NA 138 09/25/2022   K 4.3 09/25/2022   CO2 19 (L) 09/25/2022   GLUCOSE 88 09/25/2022   BUN 18 09/25/2022   CREATININE 0.92 09/25/2022   BILITOT 0.7 09/25/2022   ALKPHOS 95 09/25/2022   AST 16 09/25/2022   ALT 17 09/25/2022   PROT 7.3 09/25/2022   ALBUMIN 4.2 09/25/2022   CALCIUM 9.1 09/25/2022   ANIONGAP 6 06/15/2021   EGFR 82 09/25/2022   Lab Results  Component Value Date   CHOL 202 (H) 10/20/2022   Lab Results  Component Value Date  HDL 39 (L) 10/20/2022   Lab Results  Component Value Date   LDLCALC 148 (H) 10/20/2022   Lab Results  Component Value Date   TRIG 84 10/20/2022   Lab Results  Component Value Date   CHOLHDL 5.2 (H) 10/20/2022   Lab Results  Component Value Date   HGBA1C 5.6 10/20/2022      Assessment & Plan:   Problem List Items Addressed This Visit       Nervous and Auditory   Entrapment of both ulnar nerves    Her symptoms are likely due to ulnar nerve entrapment at the elbow level Referred to hand surgery-may need NCS      Relevant Medications   Semaglutide-Weight Management (WEGOVY) 0.25 MG/0.5ML  SOAJ   Semaglutide-Weight Management (WEGOVY) 0.5 MG/0.5ML SOAJ (Start on 02/12/2023)   Other Relevant Orders   Ambulatory referral to Orthopedic Surgery     Other   Morbid obesity (HCC) - Primary    BMI Readings from Last 3 Encounters:  01/22/23 50.63 kg/m  12/02/22 49.02 kg/m  10/30/22 48.65 kg/m   Diet modification and moderate exercise advised She is trying to cut down soft drink intake Advised to follow programs such as weight watchers, or use app such as Noom or Healthi Started Wegovy-initial BMI: 50.63, plan to increase dose as tolerated - sent to [facility] outpatient pharmacy      Relevant Medications   Semaglutide-Weight Management (WEGOVY) 0.25 MG/0.5ML SOAJ   Semaglutide-Weight Management (WEGOVY) 0.5 MG/0.5ML SOAJ (Start on 02/12/2023)     Meds ordered this encounter  Medications   Semaglutide-Weight Management (WEGOVY) 0.25 MG/0.5ML SOAJ    Sig: Inject 0.25 mg into the skin every 7 (seven) days.    Dispense:  2 mL    Refill:  0   Semaglutide-Weight Management (WEGOVY) 0.5 MG/0.5ML SOAJ    Sig: Inject 0.5 mg into the skin every 7 (seven) days.    Dispense:  2 mL    Refill:  0    Follow-up: Return in about 3 months (around 04/24/2023), or if symptoms worsen or fail to improve.    Anabel Halon, MD

## 2023-01-22 NOTE — Patient Instructions (Signed)
Please start taking Wegovy as prescribed.  Please follow low carb diet and perform moderate exercise/walking at least 150 mins/week.

## 2023-01-22 NOTE — Assessment & Plan Note (Addendum)
BMI Readings from Last 3 Encounters:  01/22/23 50.63 kg/m  12/02/22 49.02 kg/m  10/30/22 48.65 kg/m   Diet modification and moderate exercise advised She is trying to cut down soft drink intake Advised to follow programs such as weight watchers, or use app such as Noom or Healthi Started Wegovy-initial BMI: 50.63, plan to increase dose as tolerated - sent to [facility] outpatient pharmacy

## 2023-01-28 ENCOUNTER — Other Ambulatory Visit: Payer: Self-pay | Admitting: Internal Medicine

## 2023-01-28 DIAGNOSIS — F411 Generalized anxiety disorder: Secondary | ICD-10-CM

## 2023-03-06 ENCOUNTER — Other Ambulatory Visit (HOSPITAL_COMMUNITY): Payer: Self-pay

## 2023-03-06 ENCOUNTER — Other Ambulatory Visit: Payer: Self-pay | Admitting: Internal Medicine

## 2023-03-06 MED ORDER — WEGOVY 0.25 MG/0.5ML ~~LOC~~ SOAJ
0.2500 mg | SUBCUTANEOUS | 0 refills | Status: DC
Start: 2023-03-06 — End: 2023-05-22
  Filled 2023-03-06 – 2023-03-19 (×3): qty 2, 28d supply, fill #0

## 2023-03-18 ENCOUNTER — Other Ambulatory Visit (HOSPITAL_COMMUNITY): Payer: Self-pay

## 2023-03-19 ENCOUNTER — Other Ambulatory Visit (HOSPITAL_COMMUNITY): Payer: Self-pay

## 2023-04-20 ENCOUNTER — Other Ambulatory Visit (HOSPITAL_COMMUNITY): Payer: Self-pay

## 2023-04-29 ENCOUNTER — Ambulatory Visit: Payer: Medicaid Other | Admitting: Internal Medicine

## 2023-05-07 ENCOUNTER — Encounter: Payer: Self-pay | Admitting: Internal Medicine

## 2023-05-11 ENCOUNTER — Other Ambulatory Visit (HOSPITAL_COMMUNITY): Payer: Self-pay

## 2023-05-11 ENCOUNTER — Telehealth: Payer: Self-pay | Admitting: Pharmacist

## 2023-05-11 NOTE — Telephone Encounter (Signed)
 Pharmacy Patient Advocate Encounter   Received notification from CoverMyMeds that prior authorization for AJOVY (fremanezumab-vfrm) injection 225MG /1.5ML auto-injectors is required/requested.   Insurance verification completed.   The patient is insured through [facility] .   Per test claim: The current 30 day co-pay is, $0.00.  No PA needed at this time. This test claim was processed through [facility]- copay amounts may vary at other pharmacies due to pharmacy/plan contracts, or as the patient moves through the different stages of their insurance plan.

## 2023-05-22 ENCOUNTER — Other Ambulatory Visit: Payer: Self-pay

## 2023-05-22 ENCOUNTER — Other Ambulatory Visit (HOSPITAL_COMMUNITY): Payer: Self-pay

## 2023-05-22 ENCOUNTER — Other Ambulatory Visit: Payer: Self-pay | Admitting: Internal Medicine

## 2023-05-22 MED ORDER — WEGOVY 1 MG/0.5ML ~~LOC~~ SOAJ
1.0000 mg | SUBCUTANEOUS | 0 refills | Status: DC
  Filled 2023-05-22: qty 2, 28d supply, fill #0

## 2023-07-01 ENCOUNTER — Other Ambulatory Visit: Payer: Self-pay | Admitting: Internal Medicine

## 2023-07-01 MED ORDER — WEGOVY 1 MG/0.5ML ~~LOC~~ SOAJ
1.0000 mg | SUBCUTANEOUS | 3 refills | Status: DC
  Filled 2023-07-01: qty 2, 28d supply, fill #0

## 2023-07-02 ENCOUNTER — Ambulatory Visit (INDEPENDENT_AMBULATORY_CARE_PROVIDER_SITE_OTHER): Payer: Self-pay | Admitting: Internal Medicine

## 2023-07-02 ENCOUNTER — Encounter: Payer: Self-pay | Admitting: Internal Medicine

## 2023-07-02 ENCOUNTER — Other Ambulatory Visit (HOSPITAL_COMMUNITY): Payer: Self-pay

## 2023-07-02 ENCOUNTER — Other Ambulatory Visit: Payer: Self-pay

## 2023-07-02 DIAGNOSIS — G5623 Lesion of ulnar nerve, bilateral upper limbs: Secondary | ICD-10-CM

## 2023-07-02 DIAGNOSIS — R232 Flushing: Secondary | ICD-10-CM | POA: Diagnosis not present

## 2023-07-02 DIAGNOSIS — R7303 Prediabetes: Secondary | ICD-10-CM | POA: Diagnosis not present

## 2023-07-02 DIAGNOSIS — F411 Generalized anxiety disorder: Secondary | ICD-10-CM

## 2023-07-02 DIAGNOSIS — R569 Unspecified convulsions: Secondary | ICD-10-CM

## 2023-07-02 MED ORDER — WEGOVY 1.7 MG/0.75ML ~~LOC~~ SOAJ: 1.7000 mg | SUBCUTANEOUS | 0 refills | Status: DC

## 2023-07-02 MED ORDER — WEGOVY 1.7 MG/0.75ML ~~LOC~~ SOAJ
1.7000 mg | SUBCUTANEOUS | 0 refills | Status: DC
  Filled 2023-07-02: qty 3, 28d supply, fill #0

## 2023-07-02 MED ORDER — CITALOPRAM HYDROBROMIDE 10 MG PO TABS: 10.0000 mg | ORAL_TABLET | Freq: Every day | ORAL | 1 refills | Status: DC

## 2023-07-02 NOTE — Progress Notes (Signed)
 Established Patient Office Visit  Subjective:  Patient ID: Alexandra Garrett, female    DOB: November 27, 1985  Age: 38 y.o. MRN: [id number]  CC:  Chief Complaint  Patient presents with   Medical Management of Chronic Issues    3 month f/u    HPI Alexandra Garrett is a 38 y.o. female with past medical history of seizure disorder, migraine and GAD who presents for f/u of her chronic medical conditions.  Obesity: She is tolerating Wegovy  well.  She has lost 18 lbs since last visit.  She has been trying to follow low-carb diet. She is trying to cut down soft drink intake. She is motivated to follow low-carb diet and perform regular exercise.  She follows up with neurology for history of seizure disorder and migraine.  She is on Keppra  for seizures.  She has not had any seizures for the last 2 years.  She was started on Ajovy  for migraine, which has improved migraine frequency and intensity.. She is also on Topamax  for it.    Her anxiety is well controlled with Celexa  now.  She denies any episode of panic, but has spells of stress/anxiety at times. Denies any anhedonia, SI or HI.  She also reports numbness and tingling of the fourth and fifth digits bilaterally.  She has also experienced weakness of the hand, up to the point where she has had difficulty holding objects.  Denies any recent injury.  She has not had numbness of the medial side of the forearm at times as well.  Past Medical History:  Diagnosis Date   Anemia    Headache    High cholesterol    History of medication noncompliance    Seizures (HCC)    Since age 84 yrs    Past Surgical History:  Procedure Laterality Date   NO PAST SURGERIES      Family History  Problem Relation Age of Onset   Arthritis Mother    Depression Mother    Hypertension Mother    Breast cancer Mother        lymph nodes   Heart disease Father    Arthritis Maternal Grandmother    Cancer Maternal Grandmother        breast   Diabetes Maternal  Grandmother    Cancer Maternal Grandfather        lung   Stroke Paternal Grandmother    Heart disease Paternal Grandfather     Social History   Socioeconomic History   Marital status: Married    Spouse name: Alexandra Garrett   Number of children: 2   Years of education: some college   Highest education level: Not on file  Occupational History   Not on file  Tobacco Use   Smoking status: Never   Smokeless tobacco: Never  Vaping Use   Vaping status: Never Used  Substance and Sexual Activity   Alcohol use: No    Alcohol/week: 0.0 standard drinks of alcohol   Drug use: No   Sexual activity: Yes    Birth control/protection: I.U.D.  Other Topics Concern   Not on file  Social History Narrative   Right handed   Soda: 2-6 per day   Works at [employer].   Married April 2020,second marriage.Lives with husband and 2 kids.      Social Drivers of Corporate investment banker Strain: Low Risk  (05/15/2021)   Overall Financial Resource Strain (CARDIA)    Difficulty of Paying Living Expenses: Not very hard  Food  Insecurity: No Food Insecurity (05/15/2021)   Hunger Vital Sign    Worried About Running Out of Food in the Last Year: Never true    Ran Out of Food in the Last Year: Never true  Transportation Needs: No Transportation Needs (05/15/2021)   PRAPARE - Administrator, Civil Service (Medical): No    Lack of Transportation (Non-Medical): No  Physical Activity: Insufficiently Active (05/15/2021)   Exercise Vital Sign    Days of Exercise per Week: 5 days    Minutes of Exercise per Session: 20 min  Stress: Stress Concern Present (05/15/2021)   [employer] of Occupational Health - Occupational Stress Questionnaire    Feeling of Stress : To some extent  Social Connections: Socially Integrated (05/15/2021)   Social Connection and Isolation Panel [NHANES]    Frequency of Communication with Friends and Family: More than three times a week    Frequency of Social Gatherings with  Friends and Family: Once a week    Attends Religious Services: More than 4 times per year    Active Member of [employer] or Organizations: Yes    Attends Engineer, structural: More than 4 times per year    Marital Status: Married  Catering manager Violence: Not At Risk (05/15/2021)   Humiliation, Afraid, Rape, and Kick questionnaire    Fear of Current or Ex-Partner: No    Emotionally Abused: No    Physically Abused: No    Sexually Abused: No    Outpatient Medications Prior to Visit  Medication Sig Dispense Refill   busPIRone  (BUSPAR ) 5 MG tablet Take 1 tablet (5 mg total) by mouth 3 (three) times daily. 90 tablet 3   Cholecalciferol (VITAMIN D3) 25 MCG (1000 UT) CAPS Take 1 capsule by mouth daily.     Fezolinetant  (VEOZAH ) 45 MG TABS Take 1 tablet (45 mg total) by mouth daily. 30 tablet 2   Fremanezumab -vfrm (AJOVY ) 225 MG/1.5ML SOAJ Inject 225 mg into the skin every 30 (thirty) days. 1.68 mL 11   hydrOXYzine  (VISTARIL ) 25 MG capsule Take 1 capsule (25 mg total) by mouth every 8 (eight) hours as needed for anxiety. 30 capsule 1   levETIRAcetam  (KEPPRA  XR) 750 MG 24 hr tablet Take 2 tablets (1,500 mg total) by mouth daily. 180 tablet 3   levonorgestrel  (MIRENA ) 20 MCG/24HR IUD 1 each by Intrauterine route once.     ondansetron  (ZOFRAN -ODT) 4 MG disintegrating tablet Take 1 tablet (4 mg total) by mouth every 8 (eight) hours as needed for nausea or vomiting. 20 tablet 11   rosuvastatin  (CRESTOR ) 10 MG tablet Take 1 tablet (10 mg total) by mouth daily. 90 tablet 1   tiZANidine  (ZANAFLEX ) 4 MG tablet Take 1 tablet (4 mg total) by mouth at bedtime. 15 tablet 11   topiramate  (TOPAMAX ) 50 MG tablet Take 1 tablet (50 mg total) by mouth in the morning and at bedtime. 60 tablet 5   citalopram  (CELEXA ) 10 MG tablet TAKE 1 TABLET(10 MG) BY MOUTH DAILY 90 tablet 1   Semaglutide -Weight Management (WEGOVY ) 1 MG/0.5ML SOAJ Inject 1 mg into the skin every 7 (seven) days. 2 mL 3   No  facility-administered medications prior to visit.    No Known Allergies  ROS Review of Systems  Constitutional:  Negative for chills and fever.  HENT:  Negative for congestion, sinus pressure, sinus pain and sore throat.   Eyes:  Negative for pain and discharge.  Respiratory:  Negative for cough and shortness of breath.  Cardiovascular:  Negative for chest pain and palpitations.  Gastrointestinal:  Negative for abdominal pain, diarrhea, nausea and vomiting.  Endocrine: Negative for polydipsia and polyuria.  Genitourinary:  Negative for dysuria and hematuria.  Musculoskeletal:  Negative for neck pain and neck stiffness.  Skin:  Negative for rash.  Neurological:  Positive for weakness, numbness (B/l hands) and headaches. Negative for dizziness.  Psychiatric/Behavioral:  Positive for sleep disturbance. Negative for agitation and behavioral problems. The patient is not nervous/anxious.       Objective:    Physical Exam Vitals reviewed.  Constitutional:      General: She is not in acute distress.    Appearance: She is obese. She is not diaphoretic.  HENT:     Head: Normocephalic and atraumatic.     Nose: Nose normal.     Mouth/Throat:     Mouth: Mucous membranes are moist.  Eyes:     General: No scleral icterus.    Extraocular Movements: Extraocular movements intact.  Cardiovascular:     Rate and Rhythm: Normal rate and regular rhythm.     Heart sounds: Normal heart sounds. No murmur heard. Pulmonary:     Breath sounds: Normal breath sounds. No wheezing or rales.  Musculoskeletal:     Cervical back: Neck supple. No tenderness.     Right lower leg: No edema.     Left lower leg: No edema.  Skin:    General: Skin is warm.     Findings: No rash.  Neurological:     General: No focal deficit present.     Mental Status: She is alert and oriented to person, place, and time.     Cranial Nerves: No cranial nerve deficit.     Sensory: Sensory deficit (B/l hands - 4th and 5th  digits) present.     Motor: No weakness.  Psychiatric:        Mood and Affect: Mood normal.        Behavior: Behavior normal.     BP 113/74   Pulse 68   Ht 5\' 2"  (1.575 m)   Wt 258 lb 9.6 oz (117.3 kg)   SpO2 98%   BMI 47.30 kg/m  Wt Readings from Last 3 Encounters:  07/02/23 258 lb 9.6 oz (117.3 kg)  01/22/23 276 lb 12.8 oz (125.6 kg)  12/02/22 268 lb (121.6 kg)    Lab Results  Component Value Date   TSH 2.310 09/25/2022   Lab Results  Component Value Date   WBC 11.8 (H) 09/25/2022   HGB 14.9 09/25/2022   HCT 45.8 09/25/2022   MCV 89 09/25/2022   PLT 364 09/25/2022   Lab Results  Component Value Date   NA 138 09/25/2022   K 4.3 09/25/2022   CO2 19 (L) 09/25/2022   GLUCOSE 88 09/25/2022   BUN 18 09/25/2022   CREATININE 0.92 09/25/2022   BILITOT 0.7 09/25/2022   ALKPHOS 95 09/25/2022   AST 16 09/25/2022   ALT 17 09/25/2022   PROT 7.3 09/25/2022   ALBUMIN 4.2 09/25/2022   CALCIUM  9.1 09/25/2022   ANIONGAP 6 06/15/2021   EGFR 82 09/25/2022   Lab Results  Component Value Date   CHOL 202 (H) 10/20/2022   Lab Results  Component Value Date   HDL 39 (L) 10/20/2022   Lab Results  Component Value Date   LDLCALC 148 (H) 10/20/2022   Lab Results  Component Value Date   TRIG 84 10/20/2022   Lab Results  Component Value Date  CHOLHDL 5.2 (H) 10/20/2022   Lab Results  Component Value Date   HGBA1C 5.6 10/20/2022      Assessment & Plan:   Problem List Items Addressed This Visit       Cardiovascular and Mediastinum   Hot flashes   On Veozah , followed by OB/GYN        Nervous and Auditory   Entrapment of both ulnar nerves   Her symptoms are likely due to ulnar nerve entrapment at the elbow level Referred to hand surgery-may need NCS      Relevant Medications   citalopram  (CELEXA ) 10 MG tablet   Semaglutide -Weight Management (WEGOVY ) 1.7 MG/0.75ML SOAJ   Other Relevant Orders   Ambulatory referral to Orthopedic Surgery     Other   GAD  (generalized anxiety disorder) (Chronic)   Overall well-controlled with Celexa  Has Vistaril  as needed for spells of anxiety      Relevant Medications   citalopram  (CELEXA ) 10 MG tablet   Seizures (HCC)   On Keppra  Seizure-free for the last 2 years Followed by neurology      Morbid obesity (HCC) - Primary   BMI Readings from Last 3 Encounters:  07/02/23 47.30 kg/m  01/22/23 50.63 kg/m  12/02/22 49.02 kg/m   Has lost 18 lbs since starting Wegovy  Diet modification and moderate exercise advised She is trying to cut down soft drink intake Advised to follow programs such as weight watchers, or use app such as Noom or Healthi On Wegovy -initial BMI: 50.63, plan to increase dose as tolerated - 1.7 mg dose sent to [facility]      Relevant Medications   Semaglutide -Weight Management (WEGOVY ) 1.7 MG/0.75ML SOAJ   Prediabetes   Lab Results  Component Value Date   HGBA1C 5.6 10/20/2022   Advised to follow low carb diet On Wegovy  for weight loss      Relevant Orders   CMP14+EGFR      Meds ordered this encounter  Medications   DISCONTD: Semaglutide -Weight Management (WEGOVY ) 1.7 MG/0.75ML SOAJ    Sig: Inject 1.7 mg into the skin every 7 (seven) days.    Dispense:  3 mL    Refill:  0    Please cancel Wegovy  1 mg prescription.   citalopram  (CELEXA ) 10 MG tablet    Sig: Take 1 tablet (10 mg total) by mouth daily.    Dispense:  90 tablet    Refill:  1   Semaglutide -Weight Management (WEGOVY ) 1.7 MG/0.75ML SOAJ    Sig: Inject 1.7 mg into the skin every 7 (seven) days.    Dispense:  3 mL    Refill:  0    Please cancel Wegovy  1 mg prescription.    Follow-up: Return in about 3 months (around 10/01/2023) for Weight management.    Meldon Sport, MD

## 2023-07-02 NOTE — Assessment & Plan Note (Signed)
 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

## 2023-07-02 NOTE — Assessment & Plan Note (Signed)
Overall well-controlled with Celexa Has Vistaril as needed for spells of anxiety

## 2023-07-02 NOTE — Assessment & Plan Note (Signed)
On Veozah, followed by OB/GYN

## 2023-07-02 NOTE — Assessment & Plan Note (Signed)
On Keppra Seizure-free for the last 2 years Followed by neurology 

## 2023-07-02 NOTE — Assessment & Plan Note (Addendum)
 BMI Readings from Last 3 Encounters:  07/02/23 47.30 kg/m  01/22/23 50.63 kg/m  12/02/22 49.02 kg/m   Has lost 18 lbs since starting Wegovy  Diet modification and moderate exercise advised She is trying to cut down soft drink intake Advised to follow programs such as weight watchers, or use app such as Noom or [employer] On Wegovy -initial BMI: 50.63, plan to increase dose as tolerated - 1.7 mg dose sent to [facility] outpatient pharmacy

## 2023-07-02 NOTE — Patient Instructions (Addendum)
 Please start taking Wegovy  1.7 mg instead of 1 mg.  Please take Senokot-S as needed for constipation.  Please continue to take medications as prescribed.  Please continue to follow low carb diet and perform moderate exercise/walking at least 150 mins/week.  Please get fasting blood tests done before the next visit.

## 2023-07-02 NOTE — Assessment & Plan Note (Signed)
 Lab Results  Component Value Date   HGBA1C 5.6 10/20/2022   Advised to follow low carb diet On Wegovy  for weight loss

## 2023-07-03 ENCOUNTER — Encounter: Payer: Self-pay | Admitting: Internal Medicine

## 2023-07-03 LAB — CMP14+EGFR
ALT: 19 IU/L (ref 0–32)
AST: 16 IU/L (ref 0–40)
Albumin: 4.1 g/dL (ref 3.9–4.9)
Alkaline Phosphatase: 92 IU/L (ref 44–121)
BUN/Creatinine Ratio: 13 (ref 9–23)
BUN: 12 mg/dL (ref 6–20)
Bilirubin Total: 1.1 mg/dL (ref 0.0–1.2)
CO2: 23 mmol/L (ref 20–29)
Calcium: 9.3 mg/dL (ref 8.7–10.2)
Chloride: 104 mmol/L (ref 96–106)
Creatinine, Ser: 0.89 mg/dL (ref 0.57–1.00)
Globulin, Total: 2.9 g/dL (ref 1.5–4.5)
Glucose: 82 mg/dL (ref 70–99)
Potassium: 4.2 mmol/L (ref 3.5–5.2)
Sodium: 139 mmol/L (ref 134–144)
Total Protein: 7 g/dL (ref 6.0–8.5)
eGFR: 85 mL/min/{1.73_m2} (ref >59)

## 2023-07-21 ENCOUNTER — Other Ambulatory Visit (HOSPITAL_COMMUNITY): Payer: Self-pay

## 2023-07-21 ENCOUNTER — Other Ambulatory Visit: Payer: Self-pay | Admitting: Internal Medicine

## 2023-07-21 MED ORDER — WEGOVY 2.4 MG/0.75ML ~~LOC~~ SOAJ
2.4000 mg | SUBCUTANEOUS | 3 refills | Status: DC
Start: 2023-07-21 — End: 2023-12-07
  Filled 2023-07-21 – 2023-07-23 (×3): qty 3, 28d supply, fill #0
  Filled 2023-08-13: qty 3, 28d supply, fill #1
  Filled 2023-09-10: qty 3, 28d supply, fill #2
  Filled 2023-10-08: qty 3, 28d supply, fill #3

## 2023-07-22 ENCOUNTER — Other Ambulatory Visit (HOSPITAL_COMMUNITY): Payer: Self-pay

## 2023-07-22 ENCOUNTER — Other Ambulatory Visit: Payer: Self-pay

## 2023-07-23 ENCOUNTER — Other Ambulatory Visit (HOSPITAL_COMMUNITY): Payer: Self-pay

## 2023-08-12 ENCOUNTER — Ambulatory Visit: Admitting: Orthopedic Surgery

## 2023-08-12 ENCOUNTER — Other Ambulatory Visit (INDEPENDENT_AMBULATORY_CARE_PROVIDER_SITE_OTHER): Payer: Self-pay

## 2023-08-12 DIAGNOSIS — R202 Paresthesia of skin: Secondary | ICD-10-CM

## 2023-08-12 DIAGNOSIS — R200 Anesthesia of skin: Secondary | ICD-10-CM

## 2023-08-12 NOTE — Progress Notes (Signed)
 Alexandra WESTERGREN - 38 y.o. female MRN [id number]  Date of birth: 1986/02/12  Office Visit Note: Visit Date: 08/12/2023 PCP: Meldon Sport, MD Referred by: Meldon Sport, MD  Subjective: No chief complaint on file.  HPI: Alexandra Garrett is a pleasant 38 y.o. female who presents today for evaluation of bilateral hand numbness and tingling that has been present now for roughly 1 year.  She states that the symptoms are progressive in nature.  She does have occasional nocturnal symptoms, she is also describing symptoms throughout the course of the day depending on activity level.  Her numbness and tingling she describes in the bilateral thumbs as well as the bilateral ring and small fingers.  She works as a Research scientist (medical), is overall healthy and active at baseline.  As far as prior treatments, she has trialed bilateral wrist bracing she stated with mild relief of her symptoms.  She does follow with neurology for history of seizure disorder and migraines.  Pertinent ROS were reviewed with the patient and found to be negative unless otherwise specified above in HPI.   Visit Reason: bilateral hand numbness and tingling Duration of symptoms: 1 year Hand dominance: right Occupation:Dog Groomer Diabetic: No Smoking: No Heart/Lung History: none Blood Thinners:  none  Prior Testing/EMG: none Injections (Date): none Treatments: braces Prior Surgery: none  Assessment & Plan: Visit Diagnoses:  1. Numbness and tingling in both hands     Plan: Extensive discussion was had with patient today regarding her numbness and tingling in the bilateral hands.  Her symptoms are involving both the median and ulnar distributions.  Her symptoms do appear progressive in nature over the past year indicating potential nerve compressive pathology.  I would like to further investigate this with bilateral upper extremity electrodiagnostic testing in the near future in order to better delineate pathology in these  regions.  She will return to me after the study is complete to review results and discuss appropriate next treatment steps.  She can continue utilizing her wrist bracing as needed for potential nocturnal symptoms or activity related symptoms throughout the course of the day.  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  She expressed full understanding, will return to me after nerve study is complete.  I spent 45 minutes in the care of this patient today including review of previous documentation, imaging obtained, face-to-face time discussing all options regarding treatment and documenting the encounter.    Follow-up: No follow-ups on file.   Meds & Orders: No orders of the defined types were placed in this encounter.   Orders Placed This Encounter  Procedures   XR Wrist Complete Left   XR Wrist Complete Right   Ambulatory referral to Physical Medicine Rehab     Procedures: No procedures performed      Clinical History: No specialty comments available.  She reports that she has never smoked. She has never used smokeless tobacco.  Recent Labs    10/20/22 1010  HGBA1C 5.6    Objective:   Vital Signs: There were no vitals taken for this visit.  Physical Exam  Gen: Well-appearing, in no acute distress; non-toxic CV: Regular Rate. Well-perfused. Warm.  Resp: Breathing unlabored on room air; no wheezing. Psych: Fluid speech in conversation;  appropriate affect; normal thought process  Ortho Exam PHYSICAL EXAM:  General: Patient is well appearing and in no distress.   Skin and Muscle: No significant skin changes are apparent to upper extremities.   Range of Motion and Palpation Tests: Mobility is full about the elbows with flexion and extension.   No evidence of nerve subluxation at bilateral elbow.  Forearm supination and pronation are 85/85 bilaterally.  Wrist flexion/extension is 75/65 bilaterally.  Digital flexion and extension are full.  Thumb opposition is full to the base of the small fingers bilaterally.    No cords or nodules are palpated.  No triggering is observed.     Neurologic, Vascular, Motor: Sensation is diminished to light touch in the median and ulnar nerve distribution bilaterally.    Tinel sign cubital tunnel: Mildly positive bilaterally Scratch collapse elbow: Negative bilaterally  Sensory bilateral side 2-point discrimination (ring, small): 5 to 6 mm, in comparison to bilateral median nerve distributions approximately 5 mm  Motor bilateral side SF FDP: 5/5 Froment: Negative Wartenberg: Negative FDI wasting: Negative  Fingers pink and well perfused.  Capillary refill is brisk.     Lab Results  Component Value Date   HGBA1C 5.6 10/20/2022     Imaging: XR Wrist Complete Left Result Date: 08/12/2023 There is no evidence of fracture or dislocation. There is no evidence of arthropathy or other focal bone abnormality. Soft tissues are unremarkable.   XR Wrist Complete Right Result Date: 08/12/2023 There is no evidence of fracture or dislocation. There is no evidence of arthropathy or other focal bone abnormality. Soft tissues are unremarkable.    Past Medical/Family/Surgical/Social History: Medications & Allergies reviewed per EMR, new medications updated. 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   Mood swings 10/30/2022   Night sweats 10/30/2022   Encounter for general adult medical examination with abnormal findings 10/16/2022   Prediabetes 10/16/2022   Hot flashes 09/25/2022   Encounter for examination following treatment at hospital 07/05/2021   IUD (intrauterine device) in place 05/15/2021   Depression 04/02/2021   Intractable chronic  migraine without aura and without status migrainosus 04/02/2021   GAD (generalized anxiety disorder) 04/21/2017   Morbid obesity (HCC) 04/21/2017   Vitamin D  deficiency 04/21/2017   Mixed hyperlipidemia 04/21/2017   Seizures (HCC)    GERD (gastroesophageal reflux disease) 10/07/2012   Past Medical History:  Diagnosis Date   Anemia    Headache    High cholesterol    History of medication noncompliance    Seizures (HCC)    Since age 39 yrs   Family History  Problem Relation Age of Onset   Arthritis Mother    Depression Mother    Hypertension Mother    Breast cancer Mother        lymph nodes   Heart disease Father    Arthritis Maternal Grandmother    Cancer Maternal Grandmother        breast   Diabetes Maternal Grandmother    Cancer Maternal Grandfather        lung   Stroke Paternal Grandmother    Heart disease Paternal Grandfather    Past Surgical History:  Procedure Laterality Date   NO PAST SURGERIES     Social History   Occupational History   Not on file  Tobacco Use   Smoking status: Never   Smokeless tobacco: Never  Vaping Use   Vaping status: Never Used  Substance and Sexual Activity  Alcohol use: No    Alcohol/week: 0.0 standard drinks of alcohol   Drug use: No   Sexual activity: Yes    Birth control/protection: I.U.D.    Latavia Goga Alvia Jointer, M.D. [facility], Hand Surgery

## 2023-09-09 ENCOUNTER — Ambulatory Visit: Admitting: Physical Medicine and Rehabilitation

## 2023-09-09 DIAGNOSIS — M79642 Pain in left hand: Secondary | ICD-10-CM

## 2023-09-09 DIAGNOSIS — R29898 Other symptoms and signs involving the musculoskeletal system: Secondary | ICD-10-CM

## 2023-09-09 DIAGNOSIS — M79641 Pain in right hand: Secondary | ICD-10-CM

## 2023-09-09 DIAGNOSIS — R202 Paresthesia of skin: Secondary | ICD-10-CM | POA: Diagnosis not present

## 2023-09-09 NOTE — Progress Notes (Signed)
 Pain Scale   Average Pain 7 Patient advising she has numbness and tingling in both hands and it is constant without relief. Patient is Right hand dominate        +Driver, -BT, -Dye Allergies.

## 2023-09-09 NOTE — Progress Notes (Signed)
 Alexandra Garrett - 38 y.o. female MRN [id number]  Date of birth: April 02, 1985  Office Visit Note: Visit Date: 09/09/2023 PCP: Tobie Suzzane POUR, MD Referred by: Arlinda Buster, MD  Subjective: Chief Complaint  Patient presents with   Right Hand - Pain, Numbness   Left Hand - Pain, Numbness   HPI: Alexandra Garrett is a 38 y.o. female who comes in today at the request of Dr. Anshul Agarwala for evaluation and management of chronic, worsening and severe pain, numbness and tingling in the Bilateral upper extremities.  Patient is Right hand dominant.  She reports really over a year of symptoms in the hands.  It has been progressive and now where she is having symptoms during the day.  It did start out mostly with nocturnal complaints.  She gets a lot of numbness and tingling into the thumbs and index finger but also the fifth digit.  Symptoms are really into the 4th and 5th digit and thumbs bilaterally and equal.  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  She has tried wrist bracing without really much relief.  Maybe mild relief.  She does see a neurologist for seizure disorder and migraines.  No history of diabetes or thyroid  disease.    I spent more than 30 minutes speaking face-to-face with the patient with 50% of the time in counseling and discussing coordination of care.          Review of Systems  Musculoskeletal:  Positive for joint pain.  Neurological:  Positive for tingling and weakness.  All other systems reviewed and are negative.  Otherwise per HPI.  Assessment & Plan: Visit Diagnoses:    ICD-10-CM   1. Paresthesia of skin  R20.2 NCV with EMG (electromyography)    2. Bilateral hand pain  M79.641    M79.642     3. Weakness of both hands  R29.898        Plan: Impression: Complicated pain pattern and paresthesia pattern but could be underlying median neuropathy versus ulnar neuropathy.  Electrodiagnostic study performed.  Essentially NORMAL electrodiagnostic  study of both upper limbs.  There is no significant electrodiagnostic evidence of nerve entrapment, brachial plexopathy, cervical radiculopathy or generalized peripheral neuropathy.    As you know, purely sensory or demyelinating radiculopathies and chemical radiculitis may not be detected with this particular electrodiagnostic study.   **This electrodiagnostic study cannot rule out small fiber polyneuropathy and dysesthesias from central pain syndromes such as stroke or central pain sensitization syndromes such as fibromyalgia.  Myotomal referral pain from trigger points is also not excluded.  Recommendations: 1.  Follow-up with referring physician. 2.  Continue current management of symptoms.  Meds & Orders: No orders of the defined types were placed in this encounter.   Orders Placed This Encounter  Procedures   NCV with EMG (electromyography)    Follow-up: Return for Anshul Agarwala, MD.   Procedures: No procedures performed  EMG & NCV Findings: Evaluation of the left median motor nerve showed reduced amplitude (3.8 mV).  All remaining nerves (as indicated in the following tables) were within normal limits.  Left vs. Right side comparison data for the median motor nerve indicates abnormal L-R amplitude difference (61.6 %).  The ulnar motor nerve indicates abnormal L-R amplitude difference (25.5 %) and abnormal L-R velocity difference (A Elbow-B Elbow, 20 m/s).  All remaining left vs. right side differences were within normal limits.    All examined muscles (as indicated in the following table)  showed no evidence of electrical instability.    Impression: Essentially NORMAL electrodiagnostic study of both upper limbs.  There is no significant electrodiagnostic evidence of nerve entrapment, brachial plexopathy, cervical radiculopathy or generalized peripheral neuropathy.    As you know, purely sensory or demyelinating radiculopathies and chemical radiculitis may not be detected with this  particular electrodiagnostic study.   **This electrodiagnostic study cannot rule out small fiber polyneuropathy and dysesthesias from central pain syndromes such as stroke or central pain sensitization syndromes such as fibromyalgia.  Myotomal referral pain from trigger points is also not excluded.  Recommendations: 1.  Follow-up with referring physician. 2.  Continue current management of symptoms.  ___________________________ Prentice Masters FAAPMR Board Certified, American Board of Physical Medicine and Rehabilitation    Nerve Conduction Studies Anti Sensory Summary Table   Stim Site NR Peak (ms) Norm Peak (ms) P-T Amp (V) Norm P-T Amp Site1 Site2 Delta-P (ms) Dist (cm) Vel (m/s) Norm Vel (m/s)  Left Median Acr Palm Anti Sensory (2nd Digit)  30.8C  Wrist    2.8 <3.6 22.5 >10 Wrist Palm 1.1 0.0    Palm    1.7 <2.0 122.5         Right Median Acr Palm Anti Sensory (2nd Digit)  30C  Wrist    2.9 <3.6 17.7 >10 Wrist Palm 1.1 0.0    Palm    1.8 <2.0 19.4         Left Radial Anti Sensory (Base 1st Digit)  30.4C  Wrist    2.2 <3.1 36.4  Wrist Base 1st Digit 2.2 0.0    Right Radial Anti Sensory (Base 1st Digit)  30.6C  Wrist    2.1 <3.1 21.8  Wrist Base 1st Digit 2.1 0.0    Left Ulnar Anti Sensory (5th Digit)  31.2C  Wrist    3.3 <3.7 25.1 >15.0 Wrist 5th Digit 3.3 14.0 42 >38  Right Ulnar Anti Sensory (5th Digit)  30.7C  Wrist    3.2 <3.7 16.0 >15.0 Wrist 5th Digit 3.2 14.0 44 >38   Motor Summary Table   Stim Site NR Onset (ms) Norm Onset (ms) O-P Amp (mV) Norm O-P Amp Site1 Site2 Delta-0 (ms) Dist (cm) Vel (m/s) Norm Vel (m/s)  Left Median Motor (Abd Poll Brev)  30.9C  Wrist    3.6 <4.2 *3.8 >5 Elbow Wrist 3.2 18.5 58 >50  Elbow    6.8  4.6         Right Median Motor (Abd Poll Brev)  30.6C  Wrist    3.1 <4.2 9.9 >5 Elbow Wrist 3.2 17.0 53 >50  Elbow    6.3  10.1         Left Ulnar Motor (Abd Dig Min)  31.1C  Wrist    3.2 <4.2 11.4 >3 B Elbow Wrist 2.5 17.0 68 >53  B  Elbow    5.7  10.6  A Elbow B Elbow 1.4 10.0 71 >53  A Elbow    7.1  10.4         Right Ulnar Motor (Abd Dig Min)  30.7C  Wrist    3.3 <4.2 15.3 >3 B Elbow Wrist 2.6 16.5 63 >53  B Elbow    5.9  14.8  A Elbow B Elbow 1.1 10.0 91 >53  A Elbow    7.0  11.7          EMG   Side Muscle Nerve Root Ins Act Fibs Psw Amp Dur Poly Recrt Int Bruna Comment  Right Abd Poll Brev Median C8-T1 Nml Nml Nml Nml Nml 0 Nml Nml   Right 1stDorInt Ulnar C8-T1 Nml Nml Nml Nml Nml 0 Nml Nml   Right PronatorTeres Median C6-7 Nml Nml Nml Nml Nml 0 Nml Nml   Right Biceps Musculocut C5-6 Nml Nml Nml Nml Nml 0 Nml Nml   Right Deltoid Axillary C5-6 Nml Nml Nml Nml Nml 0 Nml Nml     Nerve Conduction Studies Anti Sensory Left/Right Comparison   Stim Site L Lat (ms) R Lat (ms) L-R Lat (ms) L Amp (V) R Amp (V) L-R Amp (%) Site1 Site2 L Vel (m/s) R Vel (m/s) L-R Vel (m/s)  Median Acr Palm Anti Sensory (2nd Digit)  30.8C  Wrist 2.8 2.9 0.1 22.5 17.7 21.3 Wrist Palm     Palm 1.7 1.8 0.1 122.5 19.4 84.2       Radial Anti Sensory (Base 1st Digit)  30.4C  Wrist 2.2 2.1 0.1 36.4 21.8 40.1 Wrist Base 1st Digit     Ulnar Anti Sensory (5th Digit)  31.2C  Wrist 3.3 3.2 0.1 25.1 16.0 36.3 Wrist 5th Digit 42 44 2   Motor Left/Right Comparison   Stim Site L Lat (ms) R Lat (ms) L-R Lat (ms) L Amp (mV) R Amp (mV) L-R Amp (%) Site1 Site2 L Vel (m/s) R Vel (m/s) L-R Vel (m/s)  Median Motor (Abd Poll Brev)  30.9C  Wrist 3.6 3.1 0.5 *3.8 9.9 *61.6 Elbow Wrist 58 53 5  Elbow 6.8 6.3 0.5 4.6 10.1 54.5       Ulnar Motor (Abd Dig Min)  31.1C  Wrist 3.2 3.3 0.1 11.4 15.3 *25.5 B Elbow Wrist 68 63 5  B Elbow 5.7 5.9 0.2 10.6 14.8 28.4 A Elbow B Elbow 71 91 *20  A Elbow 7.1 7.0 0.1 10.4 11.7 11.1          Waveforms:                     Clinical History: No specialty comments available.   She reports that she has never smoked. She has never used smokeless tobacco.  Recent Labs    10/20/22 1010  HGBA1C 5.6     Objective:  VS:  HT:    WT:   BMI:     BP:   HR: bpm  TEMP: ( )  RESP:  Physical Exam Vitals and nursing note reviewed.  Constitutional:      General: She is not in acute distress.    Appearance: Normal appearance. She is well-developed. She is obese. She is not ill-appearing.  HENT:     Head: Normocephalic and atraumatic.  Eyes:     Conjunctiva/sclera: Conjunctivae normal.     Pupils: Pupils are equal, round, and reactive to light.  Cardiovascular:     Rate and Rhythm: Normal rate.     Pulses: Normal pulses.  Pulmonary:     Effort: Pulmonary effort is normal.  Musculoskeletal:        General: No swelling, tenderness or deformity.     Right lower leg: No edema.     Left lower leg: No edema.     Comments: Inspection reveals no atrophy of the bilateral APB or FDI or hand intrinsics. There is no swelling, color changes, allodynia or dystrophic changes. There is 5 out of 5 strength in the bilateral wrist extension, finger abduction and long finger flexion. There is intact sensation to light touch in all dermatomal and peripheral nerve distributions.  There is a negative Froment's test bilaterally. There is a equivocally positive Tinel's test at the bilateral wrist and elbow. There is a negative Phalen's test bilaterally. There is a negative Hoffmann's test bilaterally.  Skin:    General: Skin is warm and dry.     Findings: No erythema or rash.  Neurological:     General: No focal deficit present.     Mental Status: She is alert and oriented to person, place, and time.     Sensory: No sensory deficit.     Motor: No weakness or abnormal muscle tone.     Coordination: Coordination normal.     Gait: Gait normal.  Psychiatric:        Mood and Affect: Mood normal.        Behavior: Behavior normal.     Ortho Exam  Imaging: No results found.  Past Medical/Family/Surgical/Social History: Medications & Allergies reviewed per EMR, new medications updated. 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   Mood swings 10/30/2022   Night sweats 10/30/2022   Encounter for general adult medical examination with abnormal findings 10/16/2022   Prediabetes 10/16/2022   Hot flashes 09/25/2022   Encounter for examination following treatment at hospital 07/05/2021   IUD (intrauterine device) in place 05/15/2021   Depression 04/02/2021   Intractable chronic migraine without aura and without status migrainosus 04/02/2021   GAD (generalized anxiety disorder) 04/21/2017   Morbid obesity (HCC) 04/21/2017   Vitamin D  deficiency 04/21/2017   Mixed hyperlipidemia 04/21/2017   Seizures (HCC)    GERD (gastroesophageal reflux disease) 10/07/2012   Past Medical History:  Diagnosis Date   Anemia    Headache    High cholesterol    History of medication noncompliance    Seizures (HCC)    Since age 22 yrs   Family History  Problem Relation Age of Onset   Arthritis Mother    Depression Mother    Hypertension Mother    Breast cancer Mother        lymph nodes   Heart disease Father    Arthritis Maternal Grandmother    Cancer Maternal Grandmother        breast   Diabetes Maternal Grandmother    Cancer Maternal Grandfather        lung   Stroke Paternal Grandmother    Heart disease Paternal Grandfather    Past Surgical History:  Procedure Laterality Date   NO PAST SURGERIES     Social History   Occupational History   Not on file  Tobacco Use   Smoking status: Never   Smokeless tobacco: Never  Vaping Use   Vaping status: Never Used  Substance and Sexual Activity   Alcohol use: No    Alcohol/week: 0.0 standard drinks of alcohol   Drug use: No   Sexual activity: Yes    Birth control/protection: I.U.D.

## 2023-09-14 ENCOUNTER — Encounter: Payer: Self-pay | Admitting: Physical Medicine and Rehabilitation

## 2023-09-14 NOTE — Procedures (Signed)
 EMG & NCV Findings: Evaluation of the left median motor nerve showed reduced amplitude (3.8 mV).  All remaining nerves (as indicated in the following tables) were within normal limits.  Left vs. Right side comparison data for the median motor nerve indicates abnormal L-R amplitude difference (61.6 %).  The ulnar motor nerve indicates abnormal L-R amplitude difference (25.5 %) and abnormal L-R velocity difference (A Elbow-B Elbow, 20 m/s).  All remaining left vs. right side differences were within normal limits.    All examined muscles (as indicated in the following table) showed no evidence of electrical instability.    Impression: Essentially NORMAL electrodiagnostic study of both upper limbs.  There is no significant electrodiagnostic evidence of nerve entrapment, brachial plexopathy, cervical radiculopathy or generalized peripheral neuropathy.    As you know, purely sensory or demyelinating radiculopathies and chemical radiculitis may not be detected with this particular electrodiagnostic study.   **This electrodiagnostic study cannot rule out small fiber polyneuropathy and dysesthesias from central pain syndromes such as stroke or central pain sensitization syndromes such as fibromyalgia.  Myotomal referral pain from trigger points is also not excluded.  Recommendations: 1.  Follow-up with referring physician. 2.  Continue current management of symptoms.  ___________________________ Prentice Masters FAAPMR Board Certified, American Board of Physical Medicine and Rehabilitation    Nerve Conduction Studies Anti Sensory Summary Table   Stim Site NR Peak (ms) Norm Peak (ms) P-T Amp (V) Norm P-T Amp Site1 Site2 Delta-P (ms) Dist (cm) Vel (m/s) Norm Vel (m/s)  Left Median Acr Palm Anti Sensory (2nd Digit)  30.8C  Wrist    2.8 <3.6 22.5 >10 Wrist Palm 1.1 0.0    Palm    1.7 <2.0 122.5         Right Median Acr Palm Anti Sensory (2nd Digit)  30C  Wrist    2.9 <3.6 17.7 >10 Wrist Palm 1.1 0.0     Palm    1.8 <2.0 19.4         Left Radial Anti Sensory (Base 1st Digit)  30.4C  Wrist    2.2 <3.1 36.4  Wrist Base 1st Digit 2.2 0.0    Right Radial Anti Sensory (Base 1st Digit)  30.6C  Wrist    2.1 <3.1 21.8  Wrist Base 1st Digit 2.1 0.0    Left Ulnar Anti Sensory (5th Digit)  31.2C  Wrist    3.3 <3.7 25.1 >15.0 Wrist 5th Digit 3.3 14.0 42 >38  Right Ulnar Anti Sensory (5th Digit)  30.7C  Wrist    3.2 <3.7 16.0 >15.0 Wrist 5th Digit 3.2 14.0 44 >38   Motor Summary Table   Stim Site NR Onset (ms) Norm Onset (ms) O-P Amp (mV) Norm O-P Amp Site1 Site2 Delta-0 (ms) Dist (cm) Vel (m/s) Norm Vel (m/s)  Left Median Motor (Abd Poll Brev)  30.9C  Wrist    3.6 <4.2 *3.8 >5 Elbow Wrist 3.2 18.5 58 >50  Elbow    6.8  4.6         Right Median Motor (Abd Poll Brev)  30.6C  Wrist    3.1 <4.2 9.9 >5 Elbow Wrist 3.2 17.0 53 >50  Elbow    6.3  10.1         Left Ulnar Motor (Abd Dig Min)  31.1C  Wrist    3.2 <4.2 11.4 >3 B Elbow Wrist 2.5 17.0 68 >53  B Elbow    5.7  10.6  A Elbow B Elbow 1.4 10.0 71 >53  A Elbow    7.1  10.4         Right Ulnar Motor (Abd Dig Min)  30.7C  Wrist    3.3 <4.2 15.3 >3 B Elbow Wrist 2.6 16.5 63 >53  B Elbow    5.9  14.8  A Elbow B Elbow 1.1 10.0 91 >53  A Elbow    7.0  11.7          EMG   Side Muscle Nerve Root Ins Act Fibs Psw Amp Dur Poly Recrt Int Bruna Comment  Right Abd Poll Brev Median C8-T1 Nml Nml Nml Nml Nml 0 Nml Nml   Right 1stDorInt Ulnar C8-T1 Nml Nml Nml Nml Nml 0 Nml Nml   Right PronatorTeres Median C6-7 Nml Nml Nml Nml Nml 0 Nml Nml   Right Biceps Musculocut C5-6 Nml Nml Nml Nml Nml 0 Nml Nml   Right Deltoid Axillary C5-6 Nml Nml Nml Nml Nml 0 Nml Nml     Nerve Conduction Studies Anti Sensory Left/Right Comparison   Stim Site L Lat (ms) R Lat (ms) L-R Lat (ms) L Amp (V) R Amp (V) L-R Amp (%) Site1 Site2 L Vel (m/s) R Vel (m/s) L-R Vel (m/s)  Median Acr Palm Anti Sensory (2nd Digit)  30.8C  Wrist 2.8 2.9 0.1 22.5 17.7 21.3 Wrist Palm      Palm 1.7 1.8 0.1 122.5 19.4 84.2       Radial Anti Sensory (Base 1st Digit)  30.4C  Wrist 2.2 2.1 0.1 36.4 21.8 40.1 Wrist Base 1st Digit     Ulnar Anti Sensory (5th Digit)  31.2C  Wrist 3.3 3.2 0.1 25.1 16.0 36.3 Wrist 5th Digit 42 44 2   Motor Left/Right Comparison   Stim Site L Lat (ms) R Lat (ms) L-R Lat (ms) L Amp (mV) R Amp (mV) L-R Amp (%) Site1 Site2 L Vel (m/s) R Vel (m/s) L-R Vel (m/s)  Median Motor (Abd Poll Brev)  30.9C  Wrist 3.6 3.1 0.5 *3.8 9.9 *61.6 Elbow Wrist 58 53 5  Elbow 6.8 6.3 0.5 4.6 10.1 54.5       Ulnar Motor (Abd Dig Min)  31.1C  Wrist 3.2 3.3 0.1 11.4 15.3 *25.5 B Elbow Wrist 68 63 5  B Elbow 5.7 5.9 0.2 10.6 14.8 28.4 A Elbow B Elbow 71 91 *20  A Elbow 7.1 7.0 0.1 10.4 11.7 11.1          Waveforms:

## 2023-09-16 ENCOUNTER — Other Ambulatory Visit: Payer: Self-pay | Admitting: Internal Medicine

## 2023-09-21 ENCOUNTER — Other Ambulatory Visit (HOSPITAL_COMMUNITY): Payer: Self-pay

## 2023-09-21 ENCOUNTER — Telehealth: Payer: Self-pay | Admitting: Pharmacist

## 2023-09-21 NOTE — Telephone Encounter (Signed)
 Pharmacy Patient Advocate Encounter   Received notification from CoverMyMeds that prior authorization for AJOVY  (fremanezumab -vfrm) injection 225MG /1.5ML auto-injectors is required/requested.   Insurance verification completed.   The patient is insured through [employer] [facility] .   Per test claim: PA required; PA submitted to above mentioned insurance via CoverMyMeds Key/confirmation #/EOC [id number] Status is pending

## 2023-09-21 NOTE — Telephone Encounter (Signed)
 Pharmacy Patient Advocate Encounter  Received notification from [facility] that Prior Authorization for Ajovy  225mg /1.5 ml has been APPROVED from 09/21/2023 to 09/20/2024   PA #/Case ID/Reference #: [id number]

## 2023-11-03 ENCOUNTER — Ambulatory Visit: Admitting: Internal Medicine

## 2023-12-01 NOTE — Progress Notes (Unsigned)
 [facility] [address] [place]. [place] [address] [phone]       OFFICE FOLLOW UP NOTE  [name] Date of Birth:  1985-09-07 Medical Record Number:  [id number]   Referring MD: Ronnell Coppersmith Reason for Referral: Seizure, headaches, memory loss   No chief complaint on file.    HPI:   Update 12/02/2023 JM: patient returns for yearly follow up visit. Migraines remain well controlled on Ajovy  and topamax  without side effects.   Denies any seizure activity, remains on keppra  XR 1500mg  nightly without side effects        History provided for reference purposes only Update 12/02/2022 JM: Patient returns for follow-up visit.  Migraines remain well-controlled on Ajovy  and topiramate , reports 2-3 mild migraines per month. Currently on topiramate  50mg  BID, decreased at prior visit as migraines well controlled on Ajovy . Denies any worsening on lower dosage. Occasionally uses tizanidine  with more severe migraines with benefit.  Denies any seizure activity on Keppra  XR 1500mg  nightly, tolerating without side effects  Cognition has been overall stable, can fluctuate at times. Working on managing anxiety/depression, OBGYN recently added buspirone  for anxiety which has been helpful, also on Celexa . Also notes fatigue, noted low Vit D level, currently on supplement.   Update 05/28/2022 JM: Patient returns for migraine and seizure follow-up.  At prior visit, she was started on Ajovy  for migraine prevention and continued on topiramate  and started tizanidine  and Zofran  as needed.  She was also switched from Keppra  IR to Keppra  XR as difficulty remembering twice daily dosing.  Reports significant improvement of migraines since starting on Ajovy .  She has only had 2-3 migraines over the past 5 months.  Also continues on topiramate  100 mg twice daily.  Will take tizanidine  with migraine onset with benefit.  Denies any seizure activity since prior visit.  Feels  like she is doing better on Keppra  XR than IR formulation.   Memory difficulties stable since prior visit  Update 12/26/2021 JM: Patient returns for follow-up visit regarding history of migraine headaches and seizures.  She is accompanied by her husband.  She reports continued mixed migrainous and tension type headaches.  She has been experiencing almost daily tension type headaches and about 16 migrainous days per month.  She did not start Emgality  as previously ordered, reports her pharmacy told her insurance did not approve but no information about this in epic.  She reports PCP discontinued propranolol  back in April and has since been experiencing worsening headaches but this had been previously discontinued by cardiology due to bradycardia which was fully discussed at prior visit as well.  She has remained on topiramate  100 mg twice daily, tolerating without side effects. She did not trial use of tizanidine  for severe headaches/migraines as she was not aware of this prescription.  She can have nausea with severe migraines.  Remains on Keppra  750 mg twice daily, reports typical seizure back in March, does admit to significantly increased stress at that time.  Denies missing any medication dosage.  She has not had any additional events since that time.  She questions Keppra  XR formulation due to ease of only taking once daily.  She continues to experience memory loss but this has been stable since prior visit.  She believes her depression/anxiety are under fairly good control on citalopram , she can have occasional increased anxiety at times.  This is currently being managed by her PCP.   Update 03/26/2021 JM: Returns for routine follow-up after prior visit 8 months  ago for headaches, seizures and memory loss concerns.  Pt eval by Dr. Buck for suspected sleep apnea in 09/2020 with completion of sleep study 12/2020 which did not show evidence of sleep apnea. Upon call for these results, she  disucssed with RN headaches worsening and restarted propranolol  on her own despite this previously being discontinued by cardiology due to bradycardia - recommended increasing topiramate  dosage from 50mg  BID to 50mg  AM and 100mg  PM and stop propranolol  per cardiology recommendations.  Tolerating current dose of topiramate  with some benefit after increasing but still c/o daily tension type headaches still bitemporal and occasionally occipital. She will also have approx 15 worsening headaches per month which seem to be more migrainous type.   Has remained on keppra  750 mg twice daily without side effects and no seizure activity  Short-term memory loss improved since prior visit - has been doing compensation strategies to further help Referred to [facility] but per patient, never called - per referral review, it was denied as [facility] not accepting new referrals. Still having issues with depression/anxiety which can trigger worsening headaches/migraines   No further concerns at this time  Update 07/18/2020 JM: Mrs. Helwig returns for sooner visit to review recent test results and continued memory concerns and headaches.  She is accompanied by her husband.  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  She is also been experiencing frequent headaches located bitemporally with constant pressure which can fluctuate throughout the day at times associated with photophobia and phonophobia.  Denies any visual changes.  At times memory can be worse with worsening headache.   She does admit to greatly increased stress levels as well as underlying untreated anxiety.  She will take Aleve frequently throughout the day without much benefit.  Reports sleeping well at night but does have daytime fatigue and husband reports snoring and witnessed apnea.  Thankfully, she has not had any additional seizures that she is aware of and has been tolerating Keppra  750 mg twice daily without side effects.  No further concerns at this time  Consult visit 06/14/2020 Dr. Rosemarie: Ms. Pester is a 38 year old Caucasian lady seen today for initial office consultation visit for seizures.  History is obtained from the patient, review of electronic medical records and I personally reviewed available imaging films in PACS.  Patient has past medical history of anemia, seizures.  Her seizures began at approximately age 31 and continued for around 10 years and stopped at age 30.  She was seen by Dr. Susen in was on Tegretol which worked quite well.  Seizures were described as being generalized tonic-clonic with tongue bite and injury.  Tegretol was tapered off and she did well and remained seizure-free for for almost 10 years and started having nocturnal seizures from age 2.  The patient states she knows she is at a seizure because she wakes up with a bad headache and occasionally has tongue bites as well as feels muscles are sore and she is tired for the next day.  She was seen in the emergency room at [facility] where she lived  for several times for the seizures.  She has moved to [place] 7 years ago and has seizures around 102/year.  She is has been seen Dr. Milton.  He did a polysomnogram which was apparently unremarkable.  He started her on Keppra  500 mg twice daily which she is tolerating well without any side effects.  Her last seizure was 2 3 weeks ago and the EMS were called but by the time they arrived patient was recovering back to baseline and refused to go to the ER.  She states she is been  compliant with her Keppra  and has not missed a single dose.  She states she has been starting having headaches after seizures and at times even memory loss.  Seizures often triggered by stress and lack of sleep.  She had a Mini-Mental status exam done today and scored   26 out of 30 with deficits in orientation and attention calculation and recall.  She is worried that her memory loss after seizure is a new finding.  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  I was unable to get records from Dr. Milton, Dr. Susen or any prior EEG studies for my review today    ROS:   14 system review of systems is positive for those listed in HPI and all other systems negative   PMH:  Past Medical History:  Diagnosis Date   Anemia    Headache    High cholesterol    History of medication noncompliance    Seizures (HCC)    Since age 89 yrs    Social History:  Social History   Socioeconomic History   Marital status: Married    Spouse name: Lynwood   Number of children: 2   Years of education: some college   Highest education level: Not on file  Occupational History   Not on file  Tobacco Use   Smoking status: Never   Smokeless tobacco: Never  Vaping Use   Vaping status: Never Used  Substance and Sexual Activity   Alcohol use: No    Alcohol/week: 0.0 standard drinks of alcohol   Drug use: No   Sexual activity: Yes    Birth control/protection: I.U.D.  Other Topics Concern   Not on file  Social History Narrative   Right handed   Soda: 2-6 per day   Works at [employer].   Married April 2020,second marriage.Lives with husband and 2 kids.      Social Drivers of Corporate investment banker Strain: Low Risk  (05/15/2021)   Overall Financial Resource Strain (CARDIA)    Difficulty of Paying Living Expenses: Not very hard  Food Insecurity: No Food Insecurity (05/15/2021)    Hunger Vital Sign    Worried About Running Out of Food in the Last Year: Never true    Ran Out of Food in the Last Year: Never true  Transportation Needs: No Transportation Needs (05/15/2021)   PRAPARE - Administrator, Civil Service (Medical): No    Lack of Transportation (Non-Medical): No  Physical Activity: Insufficiently Active (05/15/2021)   Exercise Vital Sign    Days of Exercise per Week: 5 days    Minutes of Exercise per Session: 20 min  Stress: Stress Concern Present (05/15/2021)   [employer] of Occupational Health - Occupational Stress Questionnaire    Feeling of Stress : To some extent  Social  Connections: Socially Integrated (05/15/2021)   Social Connection and Isolation Panel    Frequency of Communication with Friends and Family: More than three times a week    Frequency of Social Gatherings with Friends and Family: Once a week    Attends Religious Services: More than 4 times per year    Active Member of [employer] or Organizations: Yes    Attends Engineer, structural: More than 4 times per year    Marital Status: Married  Catering manager Violence: Not At Risk (05/15/2021)   Humiliation, Afraid, Rape, and Kick questionnaire    Fear of Current or Ex-Partner: No    Emotionally Abused: No    Physically Abused: No    Sexually Abused: No    Medications:   Current Outpatient Medications on File Prior to Visit  Medication Sig Dispense Refill   busPIRone  (BUSPAR ) 5 MG tablet Take 1 tablet (5 mg total) by mouth 3 (three) times daily. 90 tablet 3   Cholecalciferol (VITAMIN D3) 25 MCG (1000 UT) CAPS Take 1 capsule by mouth daily.     citalopram  (CELEXA ) 10 MG tablet Take 1 tablet (10 mg total) by mouth daily. 90 tablet 1   Fezolinetant  (VEOZAH ) 45 MG TABS Take 1 tablet (45 mg total) by mouth daily. 30 tablet 2   Fremanezumab -vfrm (AJOVY ) 225 MG/1.5ML SOAJ Inject 225 mg into the skin every 30 (thirty) days. 1.68 mL 11   hydrOXYzine  (VISTARIL ) 25 MG capsule Take  1 capsule (25 mg total) by mouth every 8 (eight) hours as needed for anxiety. 30 capsule 1   levETIRAcetam  (KEPPRA  XR) 750 MG 24 hr tablet Take 2 tablets (1,500 mg total) by mouth daily. 180 tablet 3   levonorgestrel  (MIRENA ) 20 MCG/24HR IUD 1 each by Intrauterine route once.     ondansetron  (ZOFRAN -ODT) 4 MG disintegrating tablet Take 1 tablet (4 mg total) by mouth every 8 (eight) hours as needed for nausea or vomiting. 20 tablet 11   rosuvastatin  (CRESTOR ) 10 MG tablet Take 1 tablet (10 mg total) by mouth daily. 90 tablet 1   Semaglutide -Weight Management (WEGOVY ) 2.4 MG/0.75ML SOAJ Inject 2.4 mg into the skin once a week. 3 mL 3   tiZANidine  (ZANAFLEX ) 4 MG tablet Take 1 tablet (4 mg total) by mouth at bedtime. 15 tablet 11   topiramate  (TOPAMAX ) 50 MG tablet Take 1 tablet (50 mg total) by mouth in the morning and at bedtime. 60 tablet 5   No current facility-administered medications on file prior to visit.    Allergies:  No Known Allergies  Physical Exam There were no vitals filed for this visit.  There is no height or weight on file to calculate BMI.  General: well developed, well nourished, very pleasant middle-age Caucasian female, seated, in no evident distress Head: head normocephalic and atraumatic.   Neck: supple with no carotid or supraclavicular bruits Cardiovascular: regular rate and rhythm, no murmurs Musculoskeletal: no deformity Skin:  no rash/petichiae Vascular:  Normal pulses all extremities   Neurologic Exam Mental Status: Awake and fully alert. Oriented to place and time. Recent and remote memory intact. Attention span, concentration and fund of knowledge appropriate. Mood and affect appropriate.  Cranial Nerves: Pupils equal, briskly reactive to light. Extraocular movements full without nystagmus. Visual fields full to confrontation. Hearing intact. Facial sensation intact. Face, tongue, palate moves normally and symmetrically.  Motor: Normal bulk and tone. Normal  strength in all tested extremity muscles Sensory.: intact to touch , pinprick , position and vibratory sensation.  Coordination: Rapid alternating movements normal in all extremities. Finger-to-nose and heel-to-shin performed accurately bilaterally. Gait and Station: Arises from chair without difficulty. Stance is normal. Gait demonstrates normal stride length and balance without use of AD.  Reflexes: 1+ and symmetric. Toes downgoing.       ASSESSMENT: 38 year old Caucasian lady with with childhood history of generalized epilepsy which she had outgrown but started having nocturnal seizures around 2010.  Also complains of short-term memory concerns and frequent headaches. Sleep study 12/2020 negative for sleep apnea     PLAN:  Nocturnal seizures -No recent seizure activity, prior seizure in 05/2021 likely provoked in setting of extreme stress -continue Keppra  XR 1500 mg nightly - refill provided  -She was advised to call office with any additional seizure activity.  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 If migraines worsen, advised to return to prior dosage.  -Continue tizanidine  4 mg nightly for severe migraine headache -Use of Zofran  as needed for nausea associated with severe migraine headache   Short-term memory loss -Overall stable, nonprogressive, can fluctuate at time -No clear  cause, suspect multifactoral -Discussed importance of depression/anxiety management as well as stress management     Follow-up in 1 year or call earlier if needed     I personally spent a total of *** minutes in the care of the patient today including {Time Based Coding:210964241}.   Harlene Bogaert, AGNP-BC  [facility] [address] [place], [place] [address]  Phone [phone] Fax [phone] Note: This document was prepared with digital dictation and possible smart phrase technology. Any transcriptional errors that result from this process are unintentional.

## 2023-12-02 ENCOUNTER — Ambulatory Visit (INDEPENDENT_AMBULATORY_CARE_PROVIDER_SITE_OTHER): Payer: Medicaid Other | Admitting: Adult Health

## 2023-12-02 ENCOUNTER — Encounter: Payer: Self-pay | Admitting: Adult Health

## 2023-12-02 VITALS — BP 95/64 | HR 59 | Ht 62.0 in | Wt 241.0 lb

## 2023-12-02 DIAGNOSIS — R569 Unspecified convulsions: Secondary | ICD-10-CM | POA: Diagnosis not present

## 2023-12-02 DIAGNOSIS — G43709 Chronic migraine without aura, not intractable, without status migrainosus: Secondary | ICD-10-CM | POA: Diagnosis not present

## 2023-12-02 DIAGNOSIS — R413 Other amnesia: Secondary | ICD-10-CM | POA: Diagnosis not present

## 2023-12-02 MED ORDER — LEVETIRACETAM ER 750 MG PO TB24: 1500.0000 mg | ORAL_TABLET | Freq: Every day | ORAL | 3 refills | Status: AC

## 2023-12-02 MED ORDER — AJOVY 225 MG/1.5ML ~~LOC~~ SOAJ: 225.0000 mg | SUBCUTANEOUS | 11 refills | Status: AC

## 2023-12-02 MED ORDER — TIZANIDINE HCL 4 MG PO TABS: 4.0000 mg | ORAL_TABLET | Freq: Every day | ORAL | 11 refills | Status: AC

## 2023-12-02 MED ORDER — ONDANSETRON 4 MG PO TBDP: 4.0000 mg | ORAL_TABLET | Freq: Three times a day (TID) | ORAL | 11 refills | Status: AC | PRN

## 2023-12-02 MED ORDER — TOPIRAMATE 25 MG PO TABS: 25.0000 mg | ORAL_TABLET | Freq: Every evening | ORAL | 11 refills | Status: AC

## 2023-12-02 NOTE — Patient Instructions (Addendum)
 Your Plan:  Continue Ajovy  monthly injection for migraine prevention  Reduce topamax  to 25mg  nightly for 2-3 weeks and if no worsening of migraines, you can completely stop. This may be contributing to your memory difficulties so hopefully this will get better after stopping. If you continue to have difficulty with memory after stopping, can consider pursuing neurocognitive evaluation   Please call with any worsening migraine headaches  Continue Keppra  XR 1500 mg nightly for seizure prevention  Please call with any recurrent seizure activity     Follow-up in 1 year or call earlier if needed      Thank you for coming to see us  at [facility]. I hope we have been able to provide you high quality care today.  You may receive a patient satisfaction survey over the next few weeks. We would appreciate your feedback and comments so that we may continue to improve ourselves and the health of our patients.

## 2023-12-07 ENCOUNTER — Other Ambulatory Visit (HOSPITAL_COMMUNITY): Payer: Self-pay

## 2023-12-07 ENCOUNTER — Other Ambulatory Visit: Payer: Self-pay | Admitting: Internal Medicine

## 2023-12-07 ENCOUNTER — Ambulatory Visit: Admitting: Internal Medicine

## 2023-12-07 MED ORDER — WEGOVY 2.4 MG/0.75ML ~~LOC~~ SOAJ
2.4000 mg | SUBCUTANEOUS | 0 refills | Status: DC
  Filled 2023-12-07 – 2023-12-23 (×4): qty 3, 28d supply, fill #0

## 2023-12-07 NOTE — Telephone Encounter (Signed)
 Copied from CRM #[id number]. Topic: Clinical - Medication Refill >> Dec 07, 2023  1:06 PM Teressa P wrote: Medication: Wegovy  2.4   Has the patient contacted their pharmacy? No (Agent: If no, request that the patient contact the pharmacy for the refill. If patient does not wish to contact the pharmacy document the reason why and proceed with request.)   [place] [facility] [address] [place] [address] Phone: [phone] Fax: [phone]  Is this the correct pharmacy for this prescription? Yes If no, delete pharmacy and type the correct one.   Has the prescription been filled recently? Yes  Is the patient out of the medication? Yes  Has the patient been seen for an appointment in the last year OR does the patient have an upcoming appointment? Yes  Can we respond through MyChart? Yes  Agent: Please be advised that Rx refills may take up to 3 business days. We ask that you follow-up with your pharmacy.

## 2023-12-08 ENCOUNTER — Other Ambulatory Visit (HOSPITAL_COMMUNITY): Payer: Self-pay

## 2023-12-16 ENCOUNTER — Other Ambulatory Visit (HOSPITAL_COMMUNITY): Payer: Self-pay

## 2023-12-17 ENCOUNTER — Encounter (HOSPITAL_COMMUNITY): Payer: Self-pay

## 2023-12-17 ENCOUNTER — Telehealth (HOSPITAL_COMMUNITY): Payer: Self-pay

## 2023-12-17 ENCOUNTER — Telehealth (HOSPITAL_COMMUNITY): Payer: Self-pay | Admitting: Pharmacy Technician

## 2023-12-17 ENCOUNTER — Other Ambulatory Visit (HOSPITAL_COMMUNITY): Payer: Self-pay

## 2023-12-17 NOTE — Telephone Encounter (Signed)
 PA request has been Received. New Encounter has been or will be created for follow up. For additional info see Pharmacy Prior Auth telephone encounter from 12/17/23.

## 2023-12-17 NOTE — Telephone Encounter (Signed)
 Pharmacy Patient Advocate Encounter   Received notification from Pt Calls Messages that prior authorization for Wegovy  2.4 mg/0.5 ml auto injectors is required/requested.   Insurance verification completed.   The patient is insured through [employer].   Per test claim: Effective October 1st, Medicaid will discontinue coverage of GLP1 medications for weight loss (such as Wegovy  and Zepbound), unless the patient has a documented history of a heart attack or stroke. Zepbound will continue to be covered only for patients with moderate to severe sleep apnea (AHI 15-30) and a BMI greater than 40. Because of this change, the prior authorization team will not be submitting new PA requests for GLP1 medications prescribed for weight loss, as patients will be unable to continue therapy under Medicaid coverage.

## 2023-12-18 ENCOUNTER — Other Ambulatory Visit (HOSPITAL_COMMUNITY): Payer: Self-pay

## 2023-12-18 ENCOUNTER — Other Ambulatory Visit: Payer: Self-pay

## 2023-12-21 ENCOUNTER — Other Ambulatory Visit (HOSPITAL_COMMUNITY): Payer: Self-pay

## 2023-12-22 ENCOUNTER — Other Ambulatory Visit (HOSPITAL_COMMUNITY): Payer: Self-pay

## 2023-12-23 ENCOUNTER — Other Ambulatory Visit (HOSPITAL_COMMUNITY): Payer: Self-pay

## 2024-01-06 ENCOUNTER — Ambulatory Visit (INDEPENDENT_AMBULATORY_CARE_PROVIDER_SITE_OTHER): Admitting: Internal Medicine

## 2024-01-06 ENCOUNTER — Encounter: Payer: Self-pay | Admitting: Internal Medicine

## 2024-01-06 VITALS — BP 105/60 | HR 64 | Ht 62.0 in | Wt 239.6 lb

## 2024-01-06 DIAGNOSIS — R569 Unspecified convulsions: Secondary | ICD-10-CM | POA: Diagnosis not present

## 2024-01-06 DIAGNOSIS — Z0001 Encounter for general adult medical examination with abnormal findings: Secondary | ICD-10-CM | POA: Diagnosis not present

## 2024-01-06 DIAGNOSIS — Z23 Encounter for immunization: Secondary | ICD-10-CM | POA: Diagnosis not present

## 2024-01-06 DIAGNOSIS — N3000 Acute cystitis without hematuria: Secondary | ICD-10-CM | POA: Diagnosis not present

## 2024-01-06 DIAGNOSIS — E559 Vitamin D deficiency, unspecified: Secondary | ICD-10-CM | POA: Diagnosis not present

## 2024-01-06 DIAGNOSIS — F411 Generalized anxiety disorder: Secondary | ICD-10-CM | POA: Diagnosis not present

## 2024-01-06 DIAGNOSIS — G43719 Chronic migraine without aura, intractable, without status migrainosus: Secondary | ICD-10-CM

## 2024-01-06 DIAGNOSIS — R7303 Prediabetes: Secondary | ICD-10-CM | POA: Diagnosis not present

## 2024-01-06 DIAGNOSIS — E782 Mixed hyperlipidemia: Secondary | ICD-10-CM

## 2024-01-06 DIAGNOSIS — F331 Major depressive disorder, recurrent, moderate: Secondary | ICD-10-CM

## 2024-01-06 MED ORDER — ROSUVASTATIN CALCIUM 10 MG PO TABS: 10.0000 mg | ORAL_TABLET | Freq: Every day | ORAL | 1 refills | Status: AC

## 2024-01-06 MED ORDER — CITALOPRAM HYDROBROMIDE 20 MG PO TABS: 20.0000 mg | ORAL_TABLET | Freq: Every day | ORAL | 1 refills | Status: AC

## 2024-01-06 MED ORDER — BUSPIRONE HCL 5 MG PO TABS: 5.0000 mg | ORAL_TABLET | Freq: Two times a day (BID) | ORAL | 2 refills | Status: AC

## 2024-01-06 NOTE — Assessment & Plan Note (Signed)
On Keppra Seizure-free for the last 2 years Followed by neurology 

## 2024-01-06 NOTE — Assessment & Plan Note (Signed)
 Physical exam as documented. Counseling done  re healthy lifestyle involving commitment to 150 minutes exercise per week, heart healthy diet, and attaining healthy weight.The importance of adequate sleep also discussed. Immunization and cancer screening needs are specifically addressed at this visit.

## 2024-01-06 NOTE — Assessment & Plan Note (Signed)
 Check lipid profile Needs to remain compliant to Crestor  Advised to follow low-carb and low cholesterol diet for now

## 2024-01-06 NOTE — Assessment & Plan Note (Addendum)
 Uncontrolled with Celexa  10 mg QD Increase dose of Celexa  to 20 mg QD Advised to restart buspirone  5 mg BID

## 2024-01-06 NOTE — Assessment & Plan Note (Signed)
 Check UA with reflex culture Advised to maintain at least 64 ounces of fluid intake in a day

## 2024-01-06 NOTE — Assessment & Plan Note (Signed)
 Flowsheet Row Office Visit from 01/06/2024 in [facility]  PHQ-9 Total Score 14   Uncontrolled, due to stress about her son Increase dose of Celexa  to 20 mg QD Advised to take buspirone  5 mg twice daily for anxiety

## 2024-01-06 NOTE — Assessment & Plan Note (Signed)
 Last vitamin D  Lab Results  Component Value Date   VD25OH 20.0 (L) 10/20/2022   Advised to take vitamin D  5000 IU daily

## 2024-01-06 NOTE — Assessment & Plan Note (Signed)
 Lab Results  Component Value Date   HGBA1C 5.6 10/20/2022   Advised to follow low carb diet Was on Wegovy  for weight loss

## 2024-01-06 NOTE — Progress Notes (Addendum)
 Established Patient Office Visit  Subjective:  Patient ID: Alexandra Garrett, female    DOB: 1985-04-12  Age: 38 y.o. MRN: [id number]  CC:  Chief Complaint  Patient presents with   Obesity    Weight manangement , 3 month f/u    Urinary Tract Infection    Has sx of uti.   Annual Exam    HPI Alexandra Garrett is a 38 y.o. female with past medical history of seizure disorder, migraine and GAD who presents for f/u of her chronic medical conditions.  Obesity: She was tolerating Wegovy  well, but has lost coverage of it now.  She has lost 19 lbs since last visit.  She has been trying to follow low-carb diet. She is trying to cut down soft drink intake. She is motivated to follow low-carb diet and perform regular exercise.  She has joined [employer] now as well.  She follows up with neurology for history of seizure disorder and migraine.  She is on Keppra  for seizures.  She has not had any seizures for the last 2 years.  She was started on Ajovy  for migraine, which has improved migraine frequency and intensity.. She is also on Topamax  for it.    MDD and GAD: She has been stressed due to her son's behavior at home.  She believes that he is going to leave the home once he turns 17 in 2 weeks.  She does not get to talk to him as he is mostly out of home for school or work, and once he returns goes directly to his room.  She is currently taking Celexa  10 mg QD for MDD.  She has not been taking buspirone  recently.  Denies any anhedonia, SI or HI.  She also reports foul-smelling urine and decreased urinary frequency for the last 1 week.  Denies any fever or chills currently.  Past Medical History:  Diagnosis Date   Anemia    Anxiety    Arthritis    Depression    Headache    High cholesterol    History of medication noncompliance    Seizures (HCC)    Since age 45 yrs    Past Surgical History:  Procedure Laterality Date   NO PAST SURGERIES      Family History  Problem Relation Age of Onset    Arthritis Mother    Depression Mother    Hypertension Mother    Breast cancer Mother        lymph nodes   Anxiety disorder Mother    Obesity Mother    Heart disease Father    Migraines Father    Arthritis Maternal Grandmother    Cancer Maternal Grandmother        breast   Diabetes Maternal Grandmother    Cancer Maternal Grandfather        lung   Stroke Paternal Grandmother    Heart disease Paternal Grandfather     Social History   Socioeconomic History   Marital status: Married    Spouse name: Lynwood   Number of children: 2   Years of education: some college   Highest education level: Some college, no degree  Occupational History   Not on file  Tobacco Use   Smoking status: Never   Smokeless tobacco: Never  Vaping Use   Vaping status: Never Used  Substance and Sexual Activity   Alcohol use: No    Alcohol/week: 0.0 standard drinks of alcohol   Drug use: No   Sexual  activity: Yes    Birth control/protection: I.U.D.  Other Topics Concern   Not on file  Social History Narrative   Right handed   Soda: 2-6 per day   Works at [employer].   Married April 2020,second marriage.Lives with husband and 2 kids.      Social Drivers of Corporate Investment Banker Strain: Low Risk  (01/19/2024)   Overall Financial Resource Strain (CARDIA)    Difficulty of Paying Living Expenses: Not very hard  Recent Concern: Financial Resource Strain - Medium Risk (12/03/2023)   Overall Financial Resource Strain (CARDIA)    Difficulty of Paying Living Expenses: Somewhat hard  Food Insecurity: Food Insecurity Present (01/19/2024)   Hunger Vital Sign    Worried About Running Out of Food in the Last Year: Sometimes true    Ran Out of Food in the Last Year: Sometimes true  Transportation Needs: No Transportation Needs (01/19/2024)   PRAPARE - Administrator, Civil Service (Medical): No    Lack of Transportation (Non-Medical): No  Physical Activity: Inactive (01/19/2024)    Exercise Vital Sign    Days of Exercise per Week: 0 days    Minutes of Exercise per Session: 0 min  Stress: No Stress Concern Present (01/19/2024)   [employer] of Occupational Health - Occupational Stress Questionnaire    Feeling of Stress: Only a little  Social Connections: Moderately Integrated (01/19/2024)   Social Connection and Isolation Panel    Frequency of Communication with Friends and Family: Once a week    Frequency of Social Gatherings with Friends and Family: Once a week    Attends Religious Services: More than 4 times per year    Active Member of [employer] or Organizations: Yes    Attends Engineer, Structural: More than 4 times per year    Marital Status: Married  Catering Manager Violence: Not At Risk (01/19/2024)   Humiliation, Afraid, Rape, and Kick questionnaire    Fear of Current or Ex-Partner: No    Emotionally Abused: No    Physically Abused: No    Sexually Abused: No    Outpatient Medications Prior to Visit  Medication Sig Dispense Refill   Cholecalciferol (VITAMIN D3) 25 MCG (1000 UT) CAPS Take 1 capsule by mouth daily.     Fezolinetant  (VEOZAH ) 45 MG TABS Take 1 tablet (45 mg total) by mouth daily. 30 tablet 2   Fremanezumab -vfrm (AJOVY ) 225 MG/1.5ML SOAJ Inject 225 mg into the skin every 30 (thirty) days. 1.68 mL 11   levETIRAcetam  (KEPPRA  XR) 750 MG 24 hr tablet Take 2 tablets (1,500 mg total) by mouth daily. 180 tablet 3   levonorgestrel  (MIRENA ) 20 MCG/24HR IUD 1 each by Intrauterine route once.     ondansetron  (ZOFRAN -ODT) 4 MG disintegrating tablet Take 1 tablet (4 mg total) by mouth every 8 (eight) hours as needed for nausea or vomiting. 20 tablet 11   tiZANidine  (ZANAFLEX ) 4 MG tablet Take 1 tablet (4 mg total) by mouth at bedtime. 15 tablet 11   topiramate  (TOPAMAX ) 25 MG tablet Take 1 tablet (25 mg total) by mouth at bedtime. 30 tablet 11   busPIRone  (BUSPAR ) 5 MG tablet Take 1 tablet (5 mg total) by mouth 3 (three) times daily. 90 tablet  3   citalopram  (CELEXA ) 10 MG tablet Take 1 tablet (10 mg total) by mouth daily. 90 tablet 1   hydrOXYzine  (VISTARIL ) 25 MG capsule Take 1 capsule (25 mg total) by mouth every 8 (eight) hours as needed  for anxiety. 30 capsule 1   rosuvastatin  (CRESTOR ) 10 MG tablet Take 1 tablet (10 mg total) by mouth daily. 90 tablet 1   semaglutide -weight management (WEGOVY ) 2.4 MG/0.75ML SOAJ SQ injection Inject 2.4 mg into the skin once a week. (Patient not taking: Reported on 01/06/2024) 3 mL 0   No facility-administered medications prior to visit.    No Known Allergies  ROS Review of Systems  Constitutional:  Negative for chills and fever.  HENT:  Negative for congestion, sinus pressure, sinus pain and sore throat.   Eyes:  Negative for pain and discharge.  Respiratory:  Negative for cough and shortness of breath.   Cardiovascular:  Negative for chest pain and palpitations.  Gastrointestinal:  Negative for abdominal pain, diarrhea, nausea and vomiting.  Endocrine: Negative for polydipsia and polyuria.  Genitourinary:  Negative for dysuria and hematuria.       Foul-smelling urine  Musculoskeletal:  Negative for neck pain and neck stiffness.  Skin:  Negative for rash.  Neurological:  Positive for weakness, numbness (B/l hands) and headaches. Negative for dizziness.  Psychiatric/Behavioral:  Positive for dysphoric mood and sleep disturbance. Negative for agitation and behavioral problems. The patient is nervous/anxious.       Objective:    Physical Exam Vitals reviewed.  Constitutional:      General: She is not in acute distress.    Appearance: She is obese. She is not diaphoretic.  HENT:     Head: Normocephalic and atraumatic.     Nose: Nose normal.     Mouth/Throat:     Mouth: Mucous membranes are moist.  Eyes:     General: No scleral icterus.    Extraocular Movements: Extraocular movements intact.  Cardiovascular:     Rate and Rhythm: Normal rate and regular rhythm.     Heart  sounds: Normal heart sounds. No murmur heard. Pulmonary:     Breath sounds: Normal breath sounds. No wheezing or rales.  Abdominal:     Palpations: Abdomen is soft.     Tenderness: There is no abdominal tenderness.  Musculoskeletal:     Cervical back: Neck supple. No tenderness.     Right lower leg: No edema.     Left lower leg: No edema.  Skin:    General: Skin is warm.     Findings: No rash.  Neurological:     General: No focal deficit present.     Mental Status: She is alert and oriented to person, place, and time.     Cranial Nerves: No cranial nerve deficit.     Sensory: Sensory deficit (B/l hands - 4th and 5th digits) present.     Motor: No weakness.  Psychiatric:        Mood and Affect: Mood is depressed. Affect is tearful.        Behavior: Behavior is cooperative.     BP 105/60   Pulse 64   Ht 5' 2 (1.575 m)   Wt 239 lb 9.6 oz (108.7 kg)   SpO2 98%   BMI 43.82 kg/m  Wt Readings from Last 3 Encounters:  01/19/24 240 lb (108.9 kg)  01/06/24 239 lb 9.6 oz (108.7 kg)  12/02/23 241 lb (109.3 kg)    Lab Results  Component Value Date   TSH 2.080 01/06/2024   Lab Results  Component Value Date   WBC 10.5 01/06/2024   HGB 13.2 01/06/2024   HCT 40.9 01/06/2024   MCV 89 01/06/2024   PLT 339 01/06/2024   Lab  Results  Component Value Date   NA 139 01/06/2024   K 4.3 01/06/2024   CO2 19 (L) 01/06/2024   GLUCOSE 89 01/06/2024   BUN 10 01/06/2024   CREATININE 0.80 01/06/2024   BILITOT 0.9 01/06/2024   ALKPHOS 83 01/06/2024   AST 16 01/06/2024   ALT 15 01/06/2024   PROT 7.2 01/06/2024   ALBUMIN 4.0 01/06/2024   CALCIUM  8.9 01/06/2024   ANIONGAP 6 06/15/2021   EGFR 97 01/06/2024   Lab Results  Component Value Date   CHOL 172 01/06/2024   Lab Results  Component Value Date   HDL 38 (L) 01/06/2024   Lab Results  Component Value Date   LDLCALC 121 (H) 01/06/2024   Lab Results  Component Value Date   TRIG 68 01/06/2024   Lab Results  Component  Value Date   CHOLHDL 4.5 (H) 01/06/2024   Lab Results  Component Value Date   HGBA1C 5.3 01/06/2024      Assessment & Plan:   Problem List Items Addressed This Visit       Cardiovascular and Mediastinum   Intractable chronic migraine without aura and without status migrainosus   Well controlled with Ajovy  On Topamax  Followed by neurology      Relevant Medications   citalopram  (CELEXA ) 20 MG tablet   rosuvastatin  (CRESTOR ) 10 MG tablet     Genitourinary   Acute cystitis without hematuria   Check UA with reflex culture Advised to maintain at least 64 ounces of fluid intake in a day      Relevant Orders   UA/M w/rflx Culture, Routine (Completed)   Microscopic Examination (Completed)   Urine Culture, Reflex (Completed)     Other   GAD (generalized anxiety disorder) (Chronic)   Uncontrolled with Celexa  10 mg QD Increase dose of Celexa  to 20 mg QD Advised to restart buspirone  5 mg BID      Relevant Medications   citalopram  (CELEXA ) 20 MG tablet   busPIRone  (BUSPAR ) 5 MG tablet   Other Relevant Orders   TSH (Completed)   Seizures (HCC)   On Keppra  Seizure-free for the last 2 years Followed by neurology      Relevant Orders   TSH (Completed)   CMP14+EGFR (Completed)   CBC with Differential/Platelet (Completed)   Vitamin D  deficiency   Last vitamin D  Lab Results  Component Value Date   VD25OH 20.0 (L) 10/20/2022   Advised to take vitamin D  5000 IU daily      Relevant Orders   VITAMIN D  25 Hydroxy (Vit-D Deficiency, Fractures) (Completed)   Mixed hyperlipidemia   Check lipid profile Needs to remain compliant to Crestor  Advised to follow low-carb and low cholesterol diet for now      Relevant Medications   rosuvastatin  (CRESTOR ) 10 MG tablet   Other Relevant Orders   Lipid panel (Completed)   MDD (major depressive disorder), recurrent episode, moderate (HCC)   Flowsheet Row Office Visit from 01/06/2024 in [facility]  PHQ-9  Total Score 14   Uncontrolled, due to stress about her son Increase dose of Celexa  to 20 mg QD Advised to take buspirone  5 mg twice daily for anxiety      Relevant Medications   citalopram  (CELEXA ) 20 MG tablet   busPIRone  (BUSPAR ) 5 MG tablet   Other Relevant Orders   TSH (Completed)   Encounter for general adult medical examination with abnormal findings - Primary   Physical exam as documented. Counseling done  re healthy lifestyle  involving commitment to 150 minutes exercise per week, heart healthy diet, and attaining healthy weight.The importance of adequate sleep also discussed. Immunization and cancer screening needs are specifically addressed at this visit.      Prediabetes   Lab Results  Component Value Date   HGBA1C 5.6 10/20/2022   Advised to follow low carb diet Was on Wegovy  for weight loss      Relevant Orders   Hemoglobin A1c (Completed)   CMP14+EGFR (Completed)   Other Visit Diagnoses       Encounter for immunization       Relevant Orders   Flu vaccine trivalent PF, 6mos and older(Flulaval,Afluria,Fluarix,Fluzone) (Completed)          Meds ordered this encounter  Medications   citalopram  (CELEXA ) 20 MG tablet    Sig: Take 1 tablet (20 mg total) by mouth daily.    Dispense:  90 tablet    Refill:  1   busPIRone  (BUSPAR ) 5 MG tablet    Sig: Take 1 tablet (5 mg total) by mouth 2 (two) times daily.    Dispense:  60 tablet    Refill:  2   rosuvastatin  (CRESTOR ) 10 MG tablet    Sig: Take 1 tablet (10 mg total) by mouth daily.    Dispense:  90 tablet    Refill:  1    Follow-up: Return in about 4 months (around 05/07/2024) for MDD and GAD.    Suzzane MARLA Blanch, MD

## 2024-01-06 NOTE — Patient Instructions (Signed)
 Please start taking Citalopram  20 mg once daily. Please take Buspirone  as discussed for anxiety.  Please continue to take medications as prescribed.  Please continue to follow low carb diet and perform moderate exercise/walking at least 150 mins/week.

## 2024-01-06 NOTE — Assessment & Plan Note (Signed)
Well controlled with Ajovy On Topamax Followed by neurology

## 2024-01-07 ENCOUNTER — Other Ambulatory Visit: Payer: Self-pay | Admitting: Internal Medicine

## 2024-01-07 ENCOUNTER — Other Ambulatory Visit: Payer: Self-pay | Admitting: Medical Genetics

## 2024-01-07 ENCOUNTER — Ambulatory Visit: Payer: Self-pay | Admitting: Internal Medicine

## 2024-01-07 DIAGNOSIS — N3000 Acute cystitis without hematuria: Secondary | ICD-10-CM

## 2024-01-07 LAB — VITAMIN D 25 HYDROXY (VIT D DEFICIENCY, FRACTURES): Vit D, 25-Hydroxy: 21.2 ng/mL — ABNORMAL LOW (ref 30.0–100.0)

## 2024-01-07 LAB — CBC WITH DIFFERENTIAL/PLATELET
Basophils Absolute: 0.1 x10E3/uL (ref 0.0–0.2)
Basos: 1 %
EOS (ABSOLUTE): 0.1 x10E3/uL (ref 0.0–0.4)
Eos: 1 %
Hematocrit: 40.9 % (ref 34.0–46.6)
Hemoglobin: 13.2 g/dL (ref 11.1–15.9)
Immature Grans (Abs): 0 x10E3/uL (ref 0.0–0.1)
Immature Granulocytes: 0 %
Lymphocytes Absolute: 3.2 x10E3/uL — ABNORMAL HIGH (ref 0.7–3.1)
Lymphs: 31 %
MCH: 28.8 pg (ref 26.6–33.0)
MCHC: 32.3 g/dL (ref 31.5–35.7)
MCV: 89 fL (ref 79–97)
Monocytes Absolute: 0.7 x10E3/uL (ref 0.1–0.9)
Monocytes: 6 %
Neutrophils Absolute: 6.4 x10E3/uL (ref 1.4–7.0)
Neutrophils: 61 %
Platelets: 339 x10E3/uL (ref 150–450)
RBC: 4.59 x10E6/uL (ref 3.77–5.28)
RDW: 12.8 % (ref 11.7–15.4)
WBC: 10.5 x10E3/uL (ref 3.4–10.8)

## 2024-01-07 LAB — LIPID PANEL
Chol/HDL Ratio: 4.5 ratio — ABNORMAL HIGH (ref 0.0–4.4)
Cholesterol, Total: 172 mg/dL (ref 100–199)
HDL: 38 mg/dL — ABNORMAL LOW (ref >39)
LDL Chol Calc (NIH): 121 mg/dL — ABNORMAL HIGH (ref 0–99)
Triglycerides: 68 mg/dL (ref 0–149)
VLDL Cholesterol Cal: 13 mg/dL (ref 5–40)

## 2024-01-07 LAB — CMP14+EGFR
ALT: 15 IU/L (ref 0–32)
AST: 16 IU/L (ref 0–40)
Albumin: 4 g/dL (ref 3.9–4.9)
Alkaline Phosphatase: 83 IU/L (ref 41–116)
BUN/Creatinine Ratio: 13 (ref 9–23)
BUN: 10 mg/dL (ref 6–20)
Bilirubin Total: 0.9 mg/dL (ref 0.0–1.2)
CO2: 19 mmol/L — ABNORMAL LOW (ref 20–29)
Calcium: 8.9 mg/dL (ref 8.7–10.2)
Chloride: 104 mmol/L (ref 96–106)
Creatinine, Ser: 0.8 mg/dL (ref 0.57–1.00)
Globulin, Total: 3.2 g/dL (ref 1.5–4.5)
Glucose: 89 mg/dL (ref 70–99)
Potassium: 4.3 mmol/L (ref 3.5–5.2)
Sodium: 139 mmol/L (ref 134–144)
Total Protein: 7.2 g/dL (ref 6.0–8.5)
eGFR: 97 mL/min/1.73 (ref >59)

## 2024-01-07 LAB — TSH: TSH: 2.08 u[IU]/mL (ref 0.450–4.500)

## 2024-01-07 LAB — HEMOGLOBIN A1C
Est. average glucose Bld gHb Est-mCnc: 105 mg/dL
Hgb A1c MFr Bld: 5.3 % (ref 4.8–5.6)

## 2024-01-07 MED ORDER — SULFAMETHOXAZOLE-TRIMETHOPRIM 800-160 MG PO TABS: 1.0000 | ORAL_TABLET | Freq: Two times a day (BID) | ORAL | 0 refills | Status: AC

## 2024-01-10 LAB — UA/M W/RFLX CULTURE, ROUTINE
Bilirubin, UA: NEGATIVE
Glucose, UA: NEGATIVE
Ketones, UA: NEGATIVE
Nitrite, UA: POSITIVE — AB
RBC, UA: NEGATIVE
Specific Gravity, UA: 1.025 (ref 1.005–1.030)
Urobilinogen, Ur: 0.2 mg/dL (ref 0.2–1.0)
pH, UA: 5.5 (ref 5.0–7.5)

## 2024-01-10 LAB — MICROSCOPIC EXAMINATION
Casts: NONE SEEN /LPF
RBC, Urine: NONE SEEN /HPF (ref 0–2)

## 2024-01-10 LAB — URINE CULTURE, REFLEX

## 2024-01-11 ENCOUNTER — Encounter: Payer: Self-pay | Admitting: Radiology

## 2024-01-19 ENCOUNTER — Other Ambulatory Visit (HOSPITAL_COMMUNITY)
Admission: RE | Admit: 2024-01-19 | Discharge: 2024-01-19 | Disposition: A | Discharge status: Home / Self Care | Source: Ambulatory Visit | Attending: Obstetrics & Gynecology | Admitting: Obstetrics & Gynecology

## 2024-01-19 ENCOUNTER — Ambulatory Visit (INDEPENDENT_AMBULATORY_CARE_PROVIDER_SITE_OTHER): Admitting: Adult Health

## 2024-01-19 ENCOUNTER — Encounter: Payer: Self-pay | Admitting: Adult Health

## 2024-01-19 VITALS — BP 119/85 | HR 68 | Ht 62.0 in | Wt 240.0 lb

## 2024-01-19 DIAGNOSIS — Z975 Presence of (intrauterine) contraceptive device: Secondary | ICD-10-CM

## 2024-01-19 DIAGNOSIS — Z01419 Encounter for gynecological examination (general) (routine) without abnormal findings: Secondary | ICD-10-CM | POA: Diagnosis not present

## 2024-01-19 DIAGNOSIS — Z1151 Encounter for screening for human papillomavirus (HPV): Secondary | ICD-10-CM

## 2024-01-19 NOTE — Progress Notes (Signed)
 Patient ID: Alexandra Garrett, female   DOB: [birth year], 38 y.o.   MRN: [id number] History of Present Illness: Alexandra Garrett is a 38 year old white female, married, G2P2002, in for a well woman gyn exam and pap. She has mirena  IUD and does not have a period.  PCP is Dr Tobie.   Current Medications, Allergies, Past Medical History, Past Surgical History, Family History and Social History were reviewed in [employer] record.     Review of Systems: Patient denies any headaches, hearing loss, fatigue, blurred vision, shortness of breath, chest pain, abdominal pain, problems with bowel movements, urination, or intercourse(not have currently). No joint pain or mood swings.  Hot flashes and moods are better She is on veozah  and Dr Tobie is refilling now, she has not LFT in October,      Physical Exam:BP 119/85 (BP Location: Right Arm, Patient Position: Sitting, Cuff Size: Large)   Pulse 68   Ht 5' 2 (1.575 m)   Wt 240 lb (108.9 kg)   BMI 43.90 kg/m   General:  Well developed, well nourished, no acute distress Skin:  Warm and dry Neck:  Midline trachea, normal thyroid , good ROM, no lymphadenopathy Lungs; Clear to auscultation bilaterally Breast:  No dominant palpable mass, retraction, or nipple discharge Cardiovascular: Regular rate and rhythm Abdomen:  Soft, non tender, no hepatosplenomegaly Pelvic:  External genitalia is normal in appearance, no lesions.  The vagina is normal in appearance. Urethra has no lesions or masses. The cervix is bulbous. +IUD string at os, pap with HR HPV genotyping performed. Uterus is felt to be normal size, shape, and contour.  No adnexal masses or tenderness noted.Bladder is non tender, no masses felt. Extremities/musculoskeletal:  No swelling or varicosities noted, no clubbing or cyanosis Psych:  No mood changes, alert and cooperative,seems happy AA is 0 Fall risk is low    01/19/2024    9:36 AM 01/06/2024    8:30 AM 07/02/2023    1:26 PM   Depression screen PHQ 2/9  Decreased Interest 1 1 0  Down, Depressed, Hopeless 1 2 0  PHQ - 2 Score 2 3 0  Altered sleeping 1 2 0  Tired, decreased energy 2 1 0  Change in appetite 2 3 0  Feeling bad or failure about yourself  0 3 0  Trouble concentrating 0 1 0  Moving slowly or fidgety/restless 0 1 0  Suicidal thoughts 0 0 0  PHQ-9 Score 7 14  0   Difficult doing work/chores  Somewhat difficult Not difficult at all     Data saved with a previous flowsheet row definition       01/19/2024    9:36 AM 01/06/2024    8:30 AM 07/02/2023    1:27 PM 01/22/2023   10:25 AM  GAD 7 : Generalized Anxiety Score  Nervous, Anxious, on Edge 1 3 0 2  Control/stop worrying 1 3 0 1  Worry too much - different things 1 3 0 1  Trouble relaxing 1 2 0 2  Restless 0 2 0 1  Easily annoyed or irritable 1 2 0 3  Afraid - awful might happen 0 1 0 2  Total GAD 7 Score 5 16 0 12  Anxiety Difficulty  Somewhat difficult Not difficult at all Very difficult    Upstream - 01/19/24 0926       Pregnancy Intention Screening   Does the patient want to become pregnant in the next year? No    Does  the patient's partner want to become pregnant in the next year? No    Would the patient like to discuss contraceptive options today? No      Contraception Wrap Up   Current Method IUD or IUS    End Method IUD or IUS    Contraception Counseling Provided No           Examination chaperoned by Tish RN   Impression and plan: 1. Encounter for routine gynecological examination with Papanicolaou smear of cervix (Primary) Pap sent Pap in 3 years if negative Physical in 1 year Labs with PCP Mammogram at 39  - Cytology - PAP  2. IUD (intrauterine device) in place Placed 12/19/15 No sex and return 02/10/24 for IUD removal and reinsertion

## 2024-01-20 ENCOUNTER — Ambulatory Visit: Payer: Self-pay | Admitting: Adult Health

## 2024-01-20 LAB — CYTOLOGY - PAP
Adequacy: ABSENT
Comment: NEGATIVE
Diagnosis: NEGATIVE
High risk HPV: NEGATIVE

## 2024-02-10 ENCOUNTER — Ambulatory Visit: Admitting: Adult Health

## 2024-05-11 ENCOUNTER — Ambulatory Visit: Admitting: Internal Medicine

## 2024-12-01 ENCOUNTER — Telehealth: Admitting: Adult Health
